# Patient Record
Sex: Female | Born: 1950 | Race: Black or African American | Hispanic: No | Marital: Married | Smoking: Never smoker
Health system: Southern US, Community
[De-identification: ages and names within clinical notes are randomized; demographics above are authoritative.]

## PROBLEM LIST (undated history)

## (undated) DIAGNOSIS — K5790 Diverticulosis of intestine, part unspecified, without perforation or abscess without bleeding: Secondary | ICD-10-CM

## (undated) DIAGNOSIS — K219 Gastro-esophageal reflux disease without esophagitis: Secondary | ICD-10-CM

## (undated) DIAGNOSIS — C801 Malignant (primary) neoplasm, unspecified: Secondary | ICD-10-CM

## (undated) DIAGNOSIS — K635 Polyp of colon: Secondary | ICD-10-CM

## (undated) DIAGNOSIS — M1990 Unspecified osteoarthritis, unspecified site: Secondary | ICD-10-CM

## (undated) DIAGNOSIS — H269 Unspecified cataract: Secondary | ICD-10-CM

## (undated) HISTORY — DX: Malignant (primary) neoplasm, unspecified: C80.1

## (undated) HISTORY — DX: Diverticulosis of intestine, part unspecified, without perforation or abscess without bleeding: K57.90

## (undated) HISTORY — PX: CHOLECYSTECTOMY: SHX55

## (undated) HISTORY — DX: Polyp of colon: K63.5

## (undated) HISTORY — DX: Unspecified osteoarthritis, unspecified site: M19.90

## (undated) HISTORY — DX: Gastro-esophageal reflux disease without esophagitis: K21.9

## (undated) HISTORY — DX: Unspecified cataract: H26.9

## (undated) HISTORY — PX: COLONOSCOPY: SHX174

## (undated) HISTORY — PX: ABDOMINAL HYSTERECTOMY: SHX81

## (undated) HISTORY — PX: TUBAL LIGATION: SHX77

## (undated) HISTORY — PX: POLYPECTOMY: SHX149

---

## 2000-08-27 ENCOUNTER — Encounter (INDEPENDENT_AMBULATORY_CARE_PROVIDER_SITE_OTHER): Payer: Self-pay | Admitting: Specialist

## 2000-08-27 ENCOUNTER — Other Ambulatory Visit: Admission: RE | Admit: 2000-08-27 | Discharge: 2000-08-27 | Payer: Self-pay | Admitting: *Deleted

## 2001-05-23 ENCOUNTER — Encounter: Payer: Self-pay | Admitting: General Surgery

## 2001-05-27 ENCOUNTER — Observation Stay (HOSPITAL_COMMUNITY): Admission: RE | Admit: 2001-05-27 | Discharge: 2001-05-28 | Payer: Self-pay | Admitting: General Surgery

## 2001-05-27 ENCOUNTER — Encounter (INDEPENDENT_AMBULATORY_CARE_PROVIDER_SITE_OTHER): Payer: Self-pay | Admitting: Specialist

## 2001-07-26 ENCOUNTER — Other Ambulatory Visit: Admission: RE | Admit: 2001-07-26 | Discharge: 2001-07-26 | Payer: Self-pay | Admitting: *Deleted

## 2002-07-27 ENCOUNTER — Other Ambulatory Visit: Admission: RE | Admit: 2002-07-27 | Discharge: 2002-07-27 | Payer: Self-pay | Admitting: *Deleted

## 2003-07-30 ENCOUNTER — Other Ambulatory Visit: Admission: RE | Admit: 2003-07-30 | Discharge: 2003-07-30 | Payer: Self-pay | Admitting: *Deleted

## 2004-08-04 ENCOUNTER — Other Ambulatory Visit: Admission: RE | Admit: 2004-08-04 | Discharge: 2004-08-04 | Payer: Self-pay | Admitting: *Deleted

## 2006-08-12 ENCOUNTER — Other Ambulatory Visit: Admission: RE | Admit: 2006-08-12 | Discharge: 2006-08-12 | Payer: Self-pay | Admitting: *Deleted

## 2006-08-23 ENCOUNTER — Encounter: Admission: RE | Admit: 2006-08-23 | Discharge: 2006-08-23 | Payer: Self-pay | Admitting: *Deleted

## 2006-08-23 IMAGING — CT CT ABDOMEN W/ CM
2 of 5 series · 17 of 46 positions shown, 19 images · IV contrast (READICAT/WATER & [ID] OMNI 300)
Comparison: none

CLINICAL DATA: Left lower abdominal pain

[Series 3: routine abdomen · axial · 0.79mm/px · z∈[-386,-10]mm · 14 of 85 slices shown, 16 images]
[im 5/85  soft-tissue]
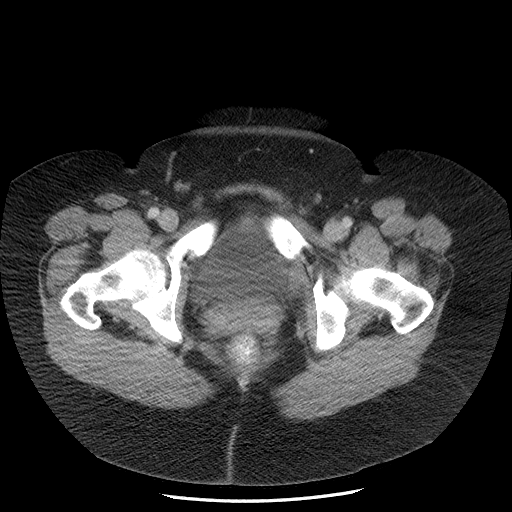
[im 5/85  bone]
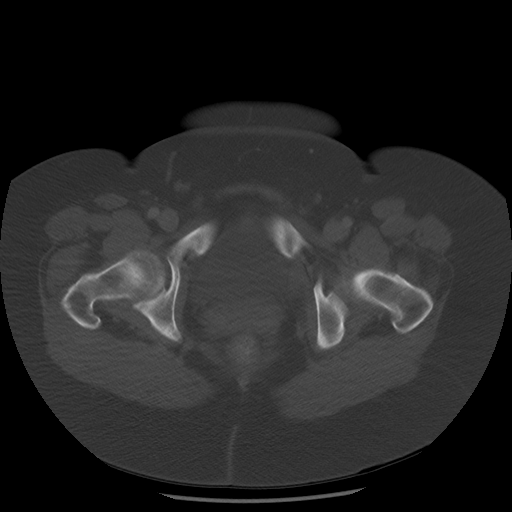
[im 10/85  soft-tissue]
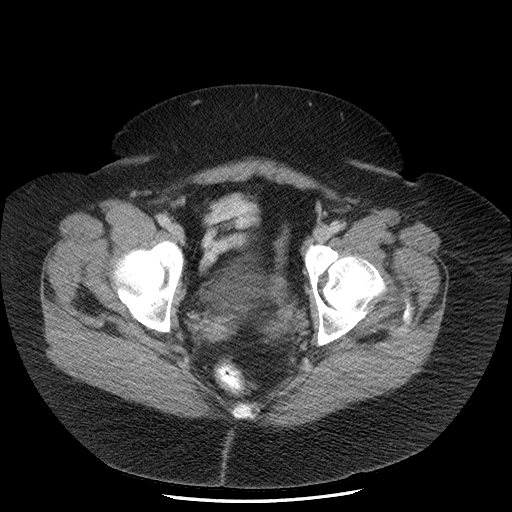
[im 19/85  soft-tissue]
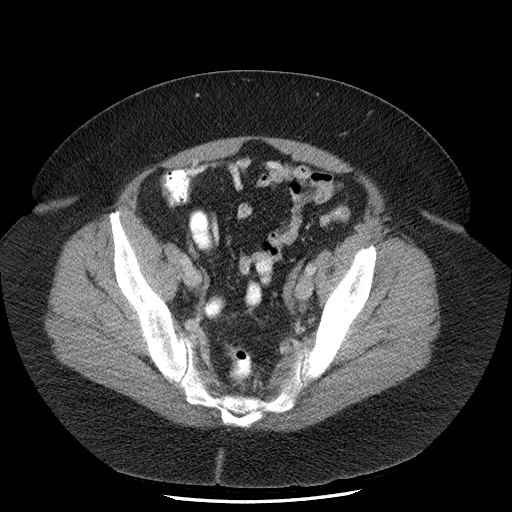
[im 24/85  soft-tissue]
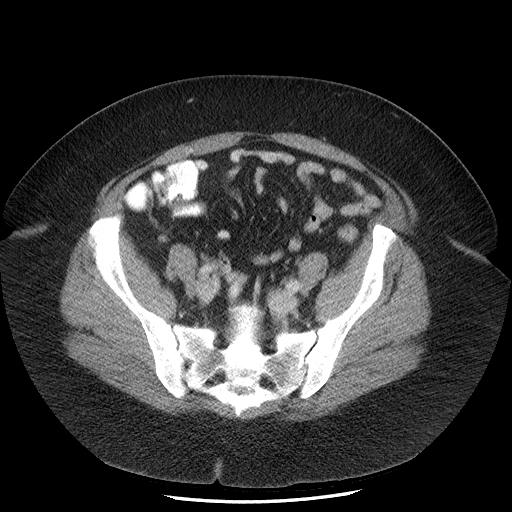
[im 29/85  soft-tissue]
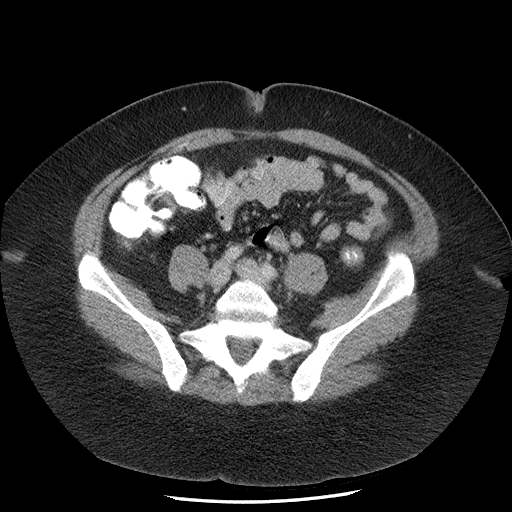
[im 33/85  soft-tissue]
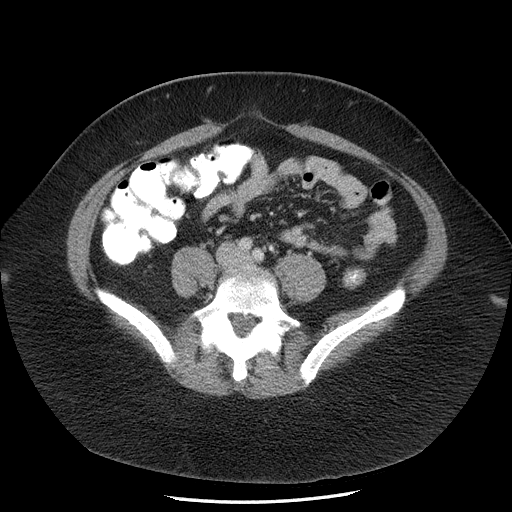
[im 38/85  soft-tissue]
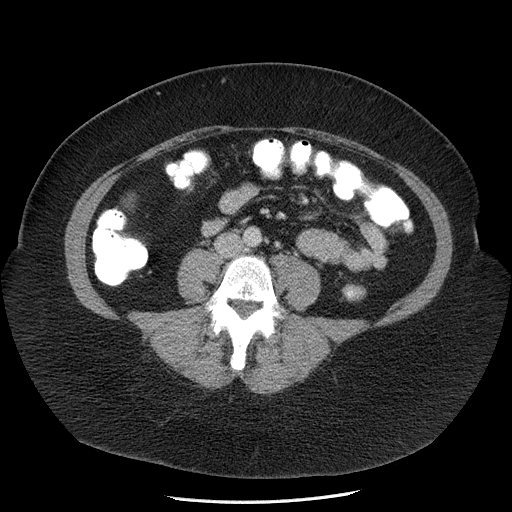
[im 47/85  soft-tissue]
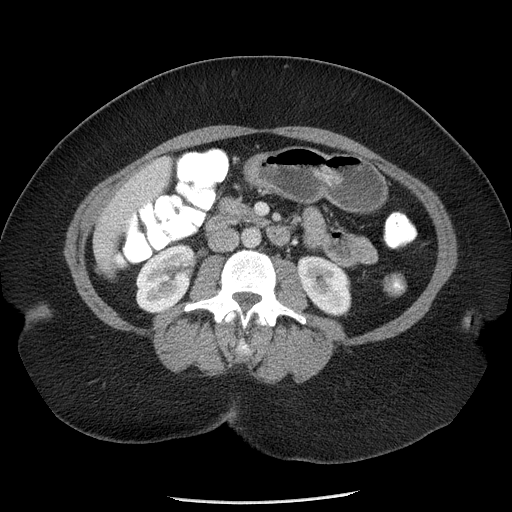
[im 52/85  soft-tissue]
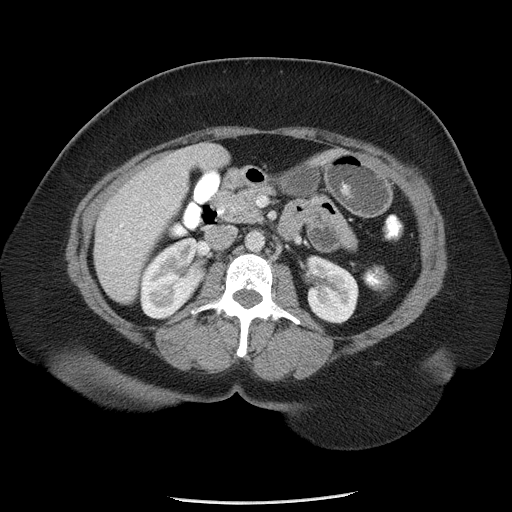
[im 52/85  bone]
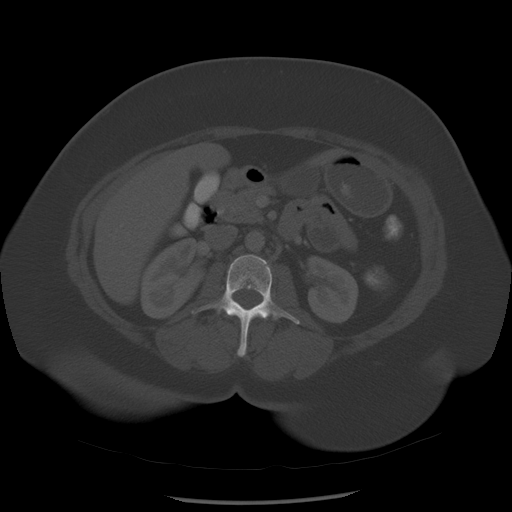
[im 57/85  soft-tissue]
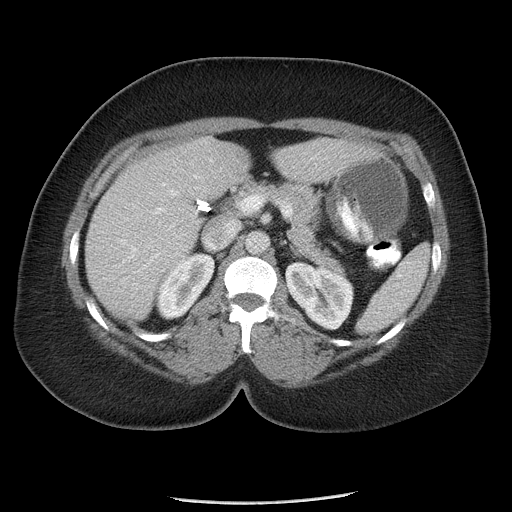
[im 61/85  soft-tissue]
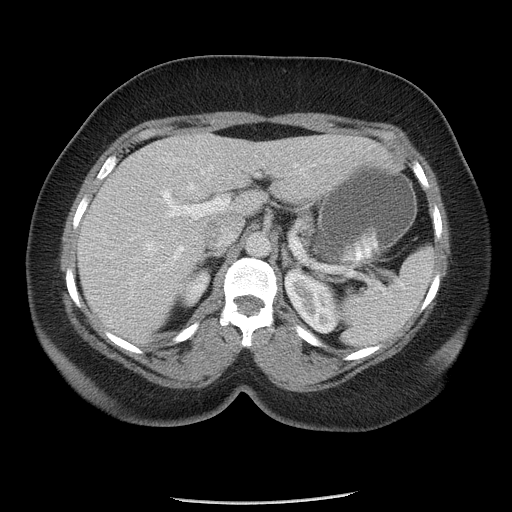
[im 66/85  soft-tissue]
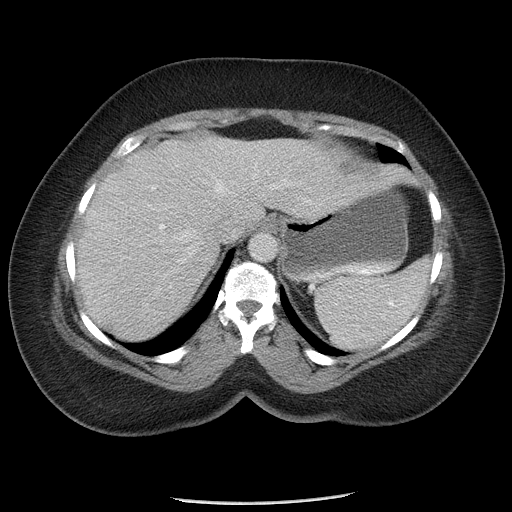
[im 75/85  soft-tissue]
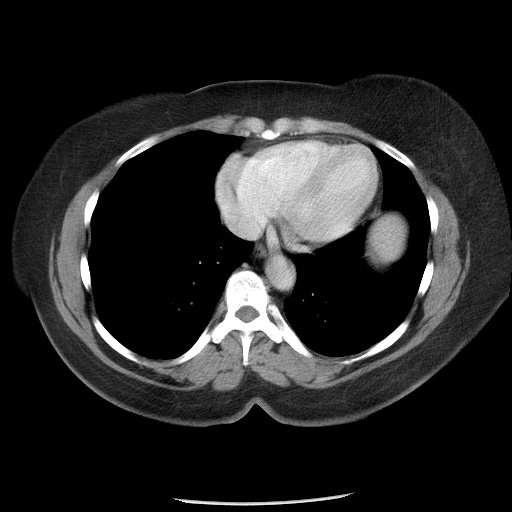
[im 80/85  soft-tissue]
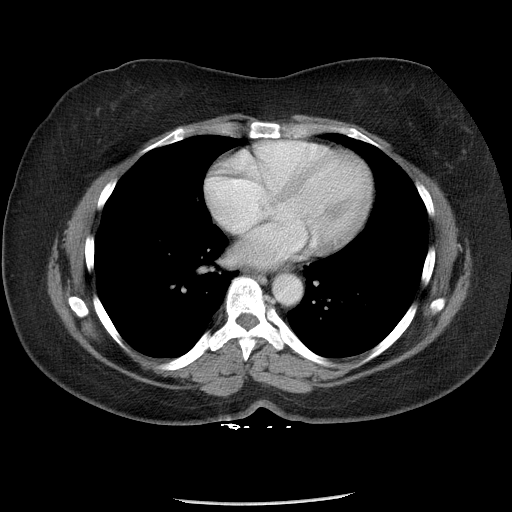

[Series 602: sagittal body · sagittal · 0.89mm/px · 3 of 163 slices shown]
[im 55/163  soft-tissue]
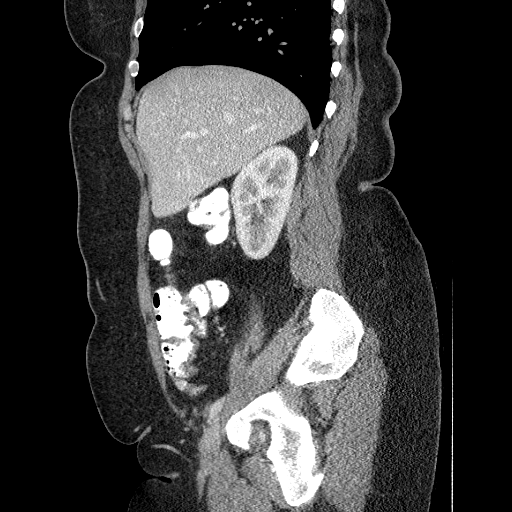
[im 73/163  soft-tissue]
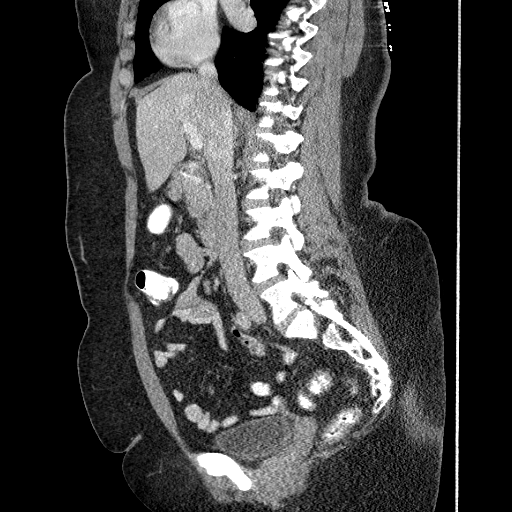
[im 91/163  soft-tissue]
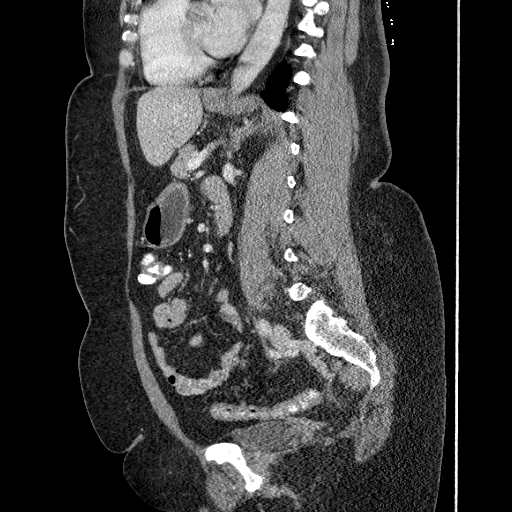

[17 of 46 positions shown; findings below may reference images not displayed]

CT abdomen with contrast:

Multidetector helical CT after 125  ml [LE] IV.
No previous for comparison. Visualized lung bases clear. Vascular clips in the
gallbladder fossa. Unremarkable liver, spleen, adrenal glands, kidneys,
pancreas, small bowel. No free air. No ascites. Portal vein patent. Delayed
scans show normal excretion by both kidneys.
IMPRESSION: 1. Unremarkable CT abdomen postcholecystectomy

CT pelvis with contrast:

Appendix not discretely identified. The colon is nondilated. Scattered
diverticula from descending and sigmoid portions of the colon with subtle
inflammatory/edematous changes around the distal descending colon. No
extraluminal gas or fluid. Urinary bladder incompletely distended. No free
fluid. Bilateral pelvic phleboliths. Hysterectomy. Osteitis pubis.
IMPRESSION: 1. Descending and sigmoid diverticulosis with question of mild distal descending
colon diverticulitis without abscess.

## 2006-08-25 ENCOUNTER — Ambulatory Visit: Payer: Self-pay | Admitting: Internal Medicine

## 2006-09-09 ENCOUNTER — Encounter (INDEPENDENT_AMBULATORY_CARE_PROVIDER_SITE_OTHER): Payer: Self-pay | Admitting: Specialist

## 2006-09-09 ENCOUNTER — Ambulatory Visit: Payer: Self-pay | Admitting: Internal Medicine

## 2006-09-09 DIAGNOSIS — K635 Polyp of colon: Secondary | ICD-10-CM

## 2006-09-09 HISTORY — DX: Polyp of colon: K63.5

## 2007-08-29 ENCOUNTER — Other Ambulatory Visit: Admission: RE | Admit: 2007-08-29 | Discharge: 2007-08-29 | Payer: Self-pay | Admitting: *Deleted

## 2010-11-21 NOTE — Op Note (Signed)
[facility]  Patient:    Tonya Bowers, Tonya Bowers Visit Number: [id number] MRN: [id number]          Service Type: SUR Location: [id number] Attending Physician:  Caleen Essex Proc. Date: 05/27/01 Admit Date:  05/27/2001 Discharge Date: 05/28/2001                             Operative Report  PREOPERATIVE DIAGNOSIS:  Cholelithiasis.  POSTOPERATIVE DIAGNOSIS:  Cholelithiasis.  PROCEDURE PERFORMED:  Laparoscopic cholecystectomy.  SURGEON:  Ollen Gross. Vernell Morgans, M.D.  ASSISTANT:  Zigmund Daniel, M.D.  ANESTHESIA:  General endotracheal.  DESCRIPTION OF PROCEDURE:  After informed consent was obtained, the patient was brought to the operating room and placed in the supine position on the operating room table.  After adequate induction of general anesthesia, the patients abdomen was prepped with Betadine, and draped in the usual sterile manner.  An area above the umbilicus was then infiltrated with 0.25% Marcaine and a small transverse incision was made using the 15 blade knife.  This incision was carried down through the subcutaneous tissue using blunt dissection with a Kelly clamp, and [employer] retractors until the linear alba was identified.  The linear alba was incised with the 15 blade knife and each side was grasped with Kocher clamps and elevated anteriorly.  The preperitoneal space was then probed bluntly with a hemostat until the peritoneum was opened and access was gained to the abdominal cavity.  A finger was inserted through this opening and there were no apparent adhesions to the abdominal wall.  A 0 Vicryl pursestring stitch was then placed in the fascia surrounding this opening.  A Hasson cannula was placed through this opening into the abdominal cavity and anchored in place with the previously placed Vicryl pursestring stitch.  The abdomen was then insufflated with carbon dioxide without difficulty and a laparoscope was placed through the  Hasson cannula.  The dome of the gallbladder and edge of the liver were readily identifiable.  A small transverse upper midline incision was then made after infiltrating this area with 0.25% Marcaine.  A 10 mm port was then placed bluntly through this incision into the abdominal cavity under direct vision. Places were then chosen on the right lateral abdomen for placement of 5 mm ports and each of these areas were infiltrated with 0.25% Marcaine.  Small stab incisions were made with the 15 blade knife and 5 mm ports were then placed bluntly through these incisions into the abdominal cavity under direct vision.  A blunt grasper was placed through the lateral most 5 mm port and used to grasp the dome of the gallbladder and elevate it anteriorly and superiorly.  Another blunt grasper was placed to the other 5 mm port and used to retract on the body and neck of the gallbladder.  A dissector was placed through the upper midline port and several adhesions to the body of the gallbladder were taken down both bluntly using the Bovie electrocautery.  Once this was complete, attention was then turned to the area of the gallbladder neck.  The peritoneal reflection of the gallbladder neck was opened with the dissector using the Bovie electrocautery and blunt dissection was then carried out in this area until the gallbladder neck/cystic duct junction was readily identified and dissected in a circumferential manner.  Three clips were then placed proximally and one distally on the cystic duct.  The duct was divided between  the two.  Posterior to this, the cystic artery was identified and again dissected in a circumferential manner.  Two clips were placed proximally and one distally in the artery and the artery was divided between the two. Next, a laparoscopic hook cautery was used to separate the gallbladder from the liver bed.  Prior to completely detaching the gallbladder from the liver bed, the liver  bed was inspected and several small bleeding points required coagulation with the Bovie electrocautery until the liver bed was hemostatic. Once this was accomplished, the gallbladder was then detached the rest of the way from the liver bed.  An endoscopic bag was placed to the upper midline port and the gallbladder was placed within the bag, the bag was sealed, and the string was cut.  The laparoscope was then removed to the upper midline port and a gallbladder grasper was placed through the Hasson cannula and used to grasp the bag, and the bag and gallbladder removed together through the supraumbilical port with the Hasson cannula.  The Hasson was then replaced and the abdomen was inspected.  The liver bed was found to be hemostatic.  The abdomen was then irrigated with copious amounts of saline until the effluent was clear.  The supraumbilical Hasson cannula was then removed and the fascia was closed with a previously placed Vicryl pursestring stitch.  The other ports were removed under direct vision and were found to be hemostatic.  The skin incisions were then closed with interrupted 4-0 Monocryl subcuticular stitches.  Benzoin and Steri-Strips were applied.  The patient tolerated the procedure well.  At the end of the case, all needle, sponge, and instrument counts were correct.  The patient was awakened and taken to the recovery room in stable condition. Attending Physician:  Caleen Essex DD:  05/30/01 TD:  05/31/01 Job: [id number]

## 2010-12-17 ENCOUNTER — Encounter: Payer: Self-pay | Admitting: Internal Medicine

## 2010-12-22 ENCOUNTER — Encounter: Payer: Self-pay | Admitting: Internal Medicine

## 2010-12-22 ENCOUNTER — Ambulatory Visit (INDEPENDENT_AMBULATORY_CARE_PROVIDER_SITE_OTHER): Payer: 59 | Admitting: Internal Medicine

## 2010-12-22 VITALS — BP 110/72 | HR 80 | Ht 65.5 in | Wt 247.0 lb

## 2010-12-22 DIAGNOSIS — R1032 Left lower quadrant pain: Secondary | ICD-10-CM

## 2010-12-22 DIAGNOSIS — K5732 Diverticulitis of large intestine without perforation or abscess without bleeding: Secondary | ICD-10-CM

## 2010-12-22 MED ORDER — METRONIDAZOLE 250 MG PO TABS: 250.0000 mg | ORAL_TABLET | Freq: Three times a day (TID) | ORAL | Status: AC

## 2010-12-22 MED ORDER — CIPROFLOXACIN HCL 250 MG PO TABS: 250.0000 mg | ORAL_TABLET | Freq: Two times a day (BID) | ORAL | Status: AC

## 2010-12-22 NOTE — Patient Instructions (Addendum)
We have sent Cipro 250 mg to your pharmacy. Take 1 tablet by mouth twice daily x 10 days We have sent Flagyl to your pharmacy. Take 1 tablet by mouth three times daily x 10 days. Please see Dr Juanda Chance again in 2 months. CC: Dr Chevis Pretty

## 2010-12-22 NOTE — Progress Notes (Signed)
Tonya Bowers [birth year] MRN [id number]     History of Present Illness:  This is a 60 year old African American female with left lower quadrant abdominal pain of 2 months duration. She has seen Dr.Mezer, thinking it was her ovary. A pelvic ultrasound showed thickening of the left colon. She has a history of diverticulitis documented on a CT scan of the abdomen from February 2008. The scan showed inflammatory changes of the distal descending colon consistent with diverticulitis. A subsequent colonoscopy in March 2008 showed a hyperplastic polyp and diverticulosis. She has not had any flareups until 2 months ago. She denies fever, diarrhea or rectal bleeding. The pain has gradually improved and has no longer occurred every day. She denies being constipated. There is no family history of colon cancer. She had a laparoscopic cholecystectomy in 2002.   Past Medical History  Diagnosis Date  . Diverticulosis   . Hyperplastic colon polyp 09/09/06   Past Surgical History  Procedure Date  . Abdominal hysterectomy   . Cholecystectomy     reports that she has never smoked. She has never used smokeless tobacco. She reports that she does not drink alcohol or use illicit drugs. family history is negative for Colon cancer. Allergies  Allergen Reactions  . Penicillins         Review of Systems:Denies dysphagia odynophagia shortness or breath or chest pain negative for rectal bleeding  The remainder of the 10  point ROS is negative except as outlined in H&P   Physical Exam: General appearance  Well developed,in no distress. Eyes- non icteric. HEENT nontraumatic, normocephalic. Mouth no lesions, tongue papillated, no cheilosis. Neck supple without adenopathy, thyroid not enlarged, no carotid bruits, no JVD. Lungs Clear to auscultation bilaterally. Cor normal S1 normal S2, regular rhythm , no murmur,  quiet precordium. Abdomen Soft obese abdomen normoactive bowel sounds. Mild tenderness on deep  pressure in the left lower and left middle quadrants. No rebound, no mass. Liver edge at costal margin. Rectal:No stool on digital exam. Perianal area normal,mucous is Hemoccult negative. Extremities no pedal edema. Skin no lesions. Neurological alert and oriented x 3. Psychological normal mood and affect.  Assessment and Plan:  Problem #1: Patient has left lower quadrant abdominal pain with suggestion of a thickened left colon on pelvic ultrasound. She has a history of diverticulitis in 2008. I believe this is a recurrent episode. Her symptoms have actually improved.We will  treat her medically  with Cipro and Flagyl for 10 days and if the symptoms continue, we will proceed with colonoscopy. She will stay on a low-residue diet and bland foods. She will call us with an update in 3 weeks.   12/22/2010 [name]

## 2011-02-09 ENCOUNTER — Encounter: Payer: Self-pay | Admitting: Internal Medicine

## 2011-02-09 ENCOUNTER — Ambulatory Visit (INDEPENDENT_AMBULATORY_CARE_PROVIDER_SITE_OTHER): Payer: 59 | Admitting: Internal Medicine

## 2011-02-09 VITALS — BP 138/72 | HR 90 | Ht 65.0 in | Wt 247.0 lb

## 2011-02-09 DIAGNOSIS — K5732 Diverticulitis of large intestine without perforation or abscess without bleeding: Secondary | ICD-10-CM

## 2011-02-09 DIAGNOSIS — R1012 Left upper quadrant pain: Secondary | ICD-10-CM

## 2011-02-09 NOTE — Patient Instructions (Signed)
Dr Chevis Pretty

## 2011-02-09 NOTE — Progress Notes (Signed)
Tonya Bowers May 15, 1951 MRN [id number]        History of Present Illness:  This is a 60 year old white female with the left upper quadrant abdominal pain and history of diverticulitis. The pain has improved but is intermittently present usually after meals. She had diverticulitis documented on CT scan of the abdomen as inflammatory changes in the distal descending colon in  2008. Colonoscopy in March 2008 showed hyperplastic polyp and diverticulosis. She has a history of prior cholecystectomy and hysterectomy. She took Pepcid  in the past before her gallbladder surgery but has not taken any recently. She has occasional heartburn.   Past Medical History  Diagnosis Date  . Diverticulosis   . Hyperplastic colon polyp 09/09/06   Past Surgical History  Procedure Date  . Abdominal hysterectomy   . Cholecystectomy     reports that she has never smoked. She has never used smokeless tobacco. She reports that she does not drink alcohol or use illicit drugs. family history is negative for Colon cancer. Allergies  Allergen Reactions  . Penicillins         Review of Systems: Denies dysphagia or diet dysphagia or shortness of breath or chest pain  The remainder of the 10  point ROS is negative except as outlined in H&P   Physical Exam: General appearance  Well developed in no distress Eyes- non icteric HEENT nontraumatic, normocephalic Mouth no lesions, tongue papillated, no cheilosis Neck supple without adenopathy, thyroid not enlarged, no carotid bruits, no JVD Lungs Clear to auscultation bilaterally Cor normal S1 normal S2, regular rhythm , no murmur,  quiet precordium Abdomen minimal tenderness along the ulcer margin. No tenderness of fullness in left lower quadrant Rectal: Not done Extremities no pedal edema Skin no lesions Neurological alert and oriented x3 Psychological normal mood and  affect  Assessment and Plan:   Left costal margin discomfort most likely related to  gastric irritation rather than to diverticulitis. I have given her samples of Prilosec 20 mg a day to use for 14 days. She may purchase it  over-the-counter . If the symptoms continue consider upper endoscopy  Hyperplastic polyps of the colon. Recall colonoscopy in March 2015  History of diverticulitis in 2008. Currently asymptomatic. Abnormal pelvic ultrasound raising a question of abnormality of the left colon. Her exam is unremarkable today. Consider CT scan of the abdomen if symptoms reappear   02/09/2011 [name]

## 2011-08-07 ENCOUNTER — Encounter: Payer: Self-pay | Admitting: Internal Medicine

## 2011-08-07 ENCOUNTER — Ambulatory Visit (INDEPENDENT_AMBULATORY_CARE_PROVIDER_SITE_OTHER): Payer: 59 | Admitting: Internal Medicine

## 2011-08-07 VITALS — BP 140/82 | HR 72 | Temp 98.1°F | Ht 65.5 in | Wt 250.0 lb

## 2011-08-07 DIAGNOSIS — IMO0001 Reserved for inherently not codable concepts without codable children: Secondary | ICD-10-CM

## 2011-08-07 DIAGNOSIS — Z0000 Encounter for general adult medical examination without abnormal findings: Secondary | ICD-10-CM

## 2011-08-07 NOTE — Progress Notes (Signed)
  Subjective:    Patient ID: Tonya Bowers, female    DOB: 29-Aug-1950, 61 y.o.   MRN: [id number]  HPI  -year-old patient who is in today to this with our practice. She is followed annually by gynecology. She is postmenopausal and takes hormone replacement therapy. She enjoys excellent health she takes omeprazole for occasional gastroesophageal reflux disease. She has a history colonic polyps and is due for followup colonoscopy in 2015. For the past several days she has had some mild left knee discomfort but in general does quite well Past surgical history is pertinent for a cholecystectomy in 2002 she had a tonsillectomy in 1963 partial hysterectomy in 1997. This was performed due to have fibroids and endometriosis. Social history married 3 children one grandson lifelong nonsmoker Works as a Science writer Family history details of her father's health unknown Mother age 16 history of hypertension 3 brothers one status post renal transplant One sister positive for diabetes obesity and hypertension    Review of Systems  Constitutional: Negative for fever, appetite change, fatigue and unexpected weight change.  HENT: Negative for hearing loss, ear pain, nosebleeds, congestion, sore throat, mouth sores, trouble swallowing, neck stiffness, dental problem, voice change, sinus pressure and tinnitus.   Eyes: Negative for photophobia, pain, redness and visual disturbance.  Respiratory: Negative for cough, chest tightness and shortness of breath.   Cardiovascular: Negative for chest pain, palpitations and leg swelling.  Gastrointestinal: Negative for nausea, vomiting, abdominal pain, diarrhea, constipation, blood in stool, abdominal distention and rectal pain.  Genitourinary: Negative for dysuria, urgency, frequency, hematuria, flank pain, vaginal bleeding, vaginal discharge, difficulty urinating, genital sores, vaginal pain, menstrual problem and pelvic pain.  Musculoskeletal: Negative for back pain and  arthralgias (mild left knee pain).  Skin: Negative for rash.  Neurological: Negative for dizziness, syncope, speech difficulty, weakness, light-headedness, numbness and headaches.  Hematological: Negative for adenopathy. Does not bruise/bleed easily.  Psychiatric/Behavioral: Negative for suicidal ideas, behavioral problems, self-injury, dysphoric mood and agitation. The patient is not nervous/anxious.        Objective:   Physical Exam  Constitutional: She is oriented to person, place, and time. She appears well-developed and well-nourished.       Overweight. Blood pressure 140/80  HENT:  Head: Normocephalic and atraumatic.  Right Ear: External ear normal.  Left Ear: External ear normal.  Mouth/Throat: Oropharynx is clear and moist.  Eyes: Conjunctivae and EOM are normal.  Neck: Normal range of motion. Neck supple. No JVD present. No thyromegaly present.  Cardiovascular: Normal rate, regular rhythm, normal heart sounds and intact distal pulses.   No murmur heard. Pulmonary/Chest: Effort normal and breath sounds normal. She has no wheezes. She has no rales.  Abdominal: Soft. Bowel sounds are normal. She exhibits no distension and no mass. There is no tenderness. There is no rebound and no guarding.  Musculoskeletal: Normal range of motion. She exhibits no edema and no tenderness.  Neurological: She is alert and oriented to person, place, and time. She has normal reflexes. No cranial nerve deficit. She exhibits normal muscle tone. Coordination normal.  Skin: Skin is warm and dry. No rash noted.  Psychiatric: She has a normal mood and affect. Her behavior is normal.          Assessment & Plan:   Exogenous obesity Preventive health exam Left knee pain-  trial rest and anti-inflammatory medication Postmenopausal syndrome History of colonic polyps History of vitamin D deficiency Nongastrointestinal soft reflux disease

## 2011-08-07 NOTE — Patient Instructions (Addendum)
It is important that you exercise regularly, at least 20 minutes 3 to 4 times per week.  If you develop chest pain or shortness of breath seek  medical attention.  You need to lose weight.  Consider a lower calorie diet and regular exercise.  Limit your sodium (Salt) intake  Take a calcium supplement, plus [phone] units of vitamin D   Take Aleve 200 mg twice daily for pain or swelling  Return in one year for follow-up

## 2011-09-08 ENCOUNTER — Encounter: Payer: Self-pay | Admitting: Internal Medicine

## 2011-09-08 ENCOUNTER — Ambulatory Visit (INDEPENDENT_AMBULATORY_CARE_PROVIDER_SITE_OTHER): Payer: 59 | Admitting: Internal Medicine

## 2011-09-08 VITALS — BP 134/78 | HR 68 | Ht 65.0 in | Wt 247.0 lb

## 2011-09-08 DIAGNOSIS — R1012 Left upper quadrant pain: Secondary | ICD-10-CM

## 2011-09-08 NOTE — Progress Notes (Signed)
Tonya Bowers June 28, 1951 MRN [id number]    History of Present Illness:  This is a 61 year old African American female with chronic left upper quadrant abdominal pain previously evaluated in August 2012. She has history of diverticulitis in 2008 when a CT scan of the abdomen showed inflammation in the left colon. Her last colonoscopy in March 2008 showed diverticulosis as well as a hyperplastic polyp. The pain is constant, sometimes worse postprandially. She has occasional diarrhea but most of the time is constipated. She has indigestion. There is no weight loss, fever or nausea. She has a a history of a cholecystectomy in 2002 and total abdominal hysterectomy in 1997. She has taken Aleve for knee pain.   Past Medical History  Diagnosis Date  . Diverticulosis   . Hyperplastic colon polyp 09/09/06  . GERD (gastroesophageal reflux disease)   . Abdominal pain    Past Surgical History  Procedure Date  . Abdominal hysterectomy   . Cholecystectomy     reports that she has never smoked. She has never used smokeless tobacco. She reports that she does not drink alcohol or use illicit drugs. family history includes Hypertension in her brother, father, and mother and Kidney disease in her brother.  There is no history of Colon cancer. Allergies  Allergen Reactions  . Penicillins         Review of Systems: Denies dysphagia, odynophagia, chest pain or shortness of breath  The remainder of the 10 point ROS is negative except as outlined in H&P   Physical Exam: General appearance  Well developed, in no distress. Mildly overweight Eyes- non icteric. HEENT nontraumatic, normocephalic. Mouth no lesions, tongue papillated, no cheilosis. Neck supple without adenopathy, thyroid not enlarged, no carotid bruits, no JVD. Lungs Clear to auscultation bilaterally. Cor normal S1, normal S2, regular rhythm, no murmur,  quiet precordium. Abdomen: Obese soft with minimal tenderness along the left costal  margin. No fullness or mass. No CVA tenderness. Right upper quadrant unremarkable. No hernia. Rectal: Not done Extremities no pedal edema. Skin no lesions. Neurological alert and oriented x 3. Psychological normal mood and affect.  Assessment and Plan:  Problem #1 Chronic left upper quadrant abdominal pain likely related either to her colon or to her Stomach; she has known moderate diverticulosis but today's exam does not suggest diverticulitis. She may have irritable bowel syndrome, splenic flexure syndrome or possibly gastritis. We will pursue a CT scan of the abdomen with attention to the left upper quadrant as well as an upper endoscopy. I have given her samples of Prilosec 20 mg to take daily for 8 days.   09/08/2011 [name]

## 2011-09-08 NOTE — Patient Instructions (Signed)
You have been scheduled for an endoscopy with propofol. Please follow written instructions given to you at your visit today.  You have been scheduled for a CT scan of the abdomen and pelvis at [facility] ([address]---this is in the same building as Architectural technologist).   You are scheduled on 09/10/11 at 10:30 am. You should arrive 15 minutes prior to your appointment time for registration. Please follow the written instructions below on the day of your exam:  WARNING: IF YOU ARE ALLERGIC TO IODINE/X-RAY DYE, PLEASE NOTIFY RADIOLOGY IMMEDIATELY AT [phone]! YOU WILL BE GIVEN A 13 HOUR PREMEDICATION PREP.  1) Do not eat or drink anything after 6:30 am (4 hours prior to your test) 2) You have been given 1 bottle of oral contrast to drink. The solution may taste better if refrigerated, but do NOT add ice or any other liquid to this solution. Shake well before drinking.    Drink 1 bottle of contrast @ 9:30 am (1 hour prior to your exam)  You may take any medications as prescribed with a small amount of water except for the following: Metformin, Glucophage, Glucovance, Avandamet, Riomet, Fortamet, Actoplus Met, Janumet, Glumetza or Metaglip. The above medications must be held the day of the exam AND 48 hours after the exam.  The purpose of you drinking the oral contrast is to aid in the visualization of your intestinal tract. The contrast solution may cause some diarrhea. Before your exam is started, you will be given a small amount of fluid to drink. Depending on your individual set of symptoms, you may also receive an intravenous injection of x-ray contrast/dye. Plan on being at [facility] for 30 minutes or long, depending on the type of exam you are having performed.  If you have any questions regarding your exam or if you need to reschedule, you may call the CT department at [phone] between the hours of 8:00 am and 5:00 pm,  Monday-Friday.  ________________________________________________________________________ CC: Dr Sanjuana Mae

## 2011-09-10 ENCOUNTER — Ambulatory Visit (INDEPENDENT_AMBULATORY_CARE_PROVIDER_SITE_OTHER)
Admission: RE | Admit: 2011-09-10 | Discharge: 2011-09-10 | Disposition: A | Discharge status: Home / Self Care | Payer: 59 | Source: Ambulatory Visit | Attending: Internal Medicine | Admitting: Internal Medicine

## 2011-09-10 DIAGNOSIS — R1012 Left upper quadrant pain: Secondary | ICD-10-CM

## 2011-09-10 IMAGING — CT CT ABDOMEN W/ CM
2 of 5 series · 17 of 46 positions shown, 19 images · IV contrast (Omnipaque 300)
Comparison: [DATE]

CLINICAL DATA: Left upper quadrant pain; history of diverticulitis

CT ABDOMEN WITH CONTRAST
TECHNIQUE: Multidetector CT imaging of the abdomen was performed
using the standard protocol following bolus administration of
intravenous contrast.
Contrast: 100mL OMNIPAQUE IOHEXOL 300 MG/ML IJ SOLN

[Series 2: abd/ pel 5mm · axial · 0.81mm/px · z∈[-308,-68]mm · 14 of 56 slices shown, 16 images]
[im 4/56  soft-tissue]
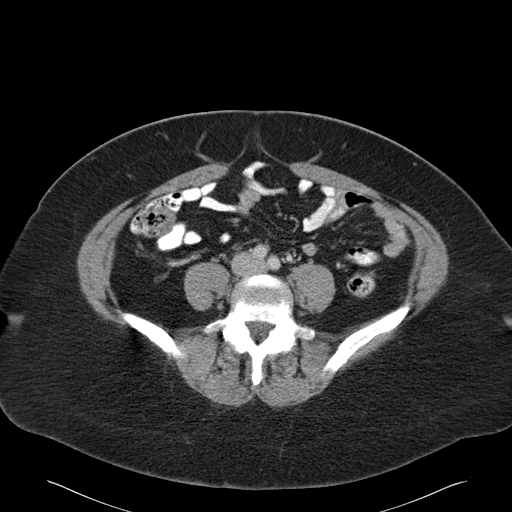
[im 4/56  bone]
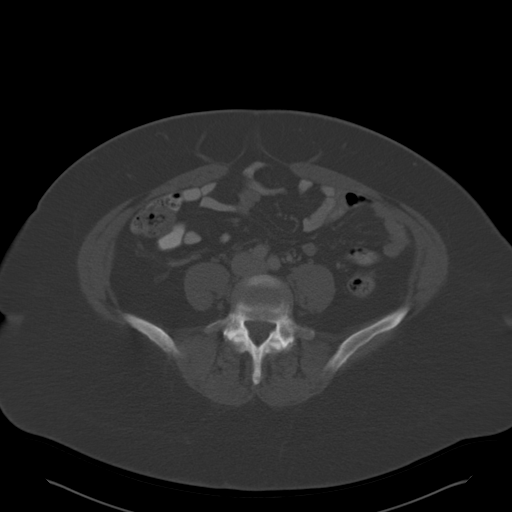
[im 7/56  soft-tissue]
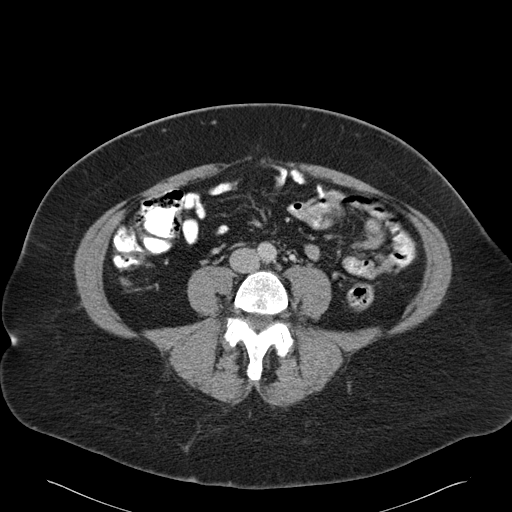
[im 11/56  soft-tissue]
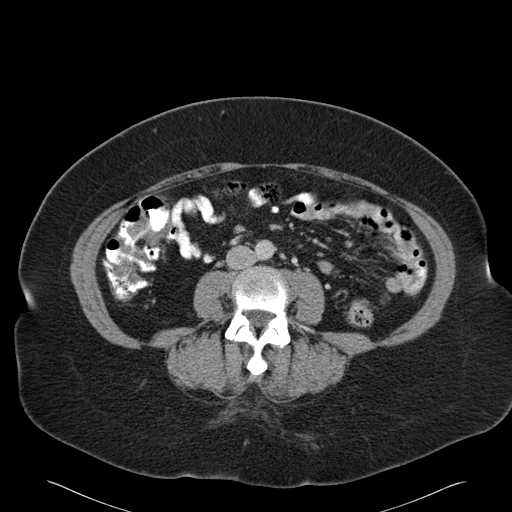
[im 14/56  soft-tissue]
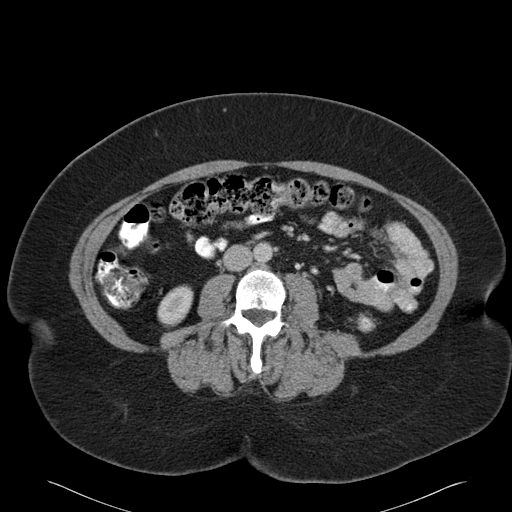
[im 18/56  soft-tissue]
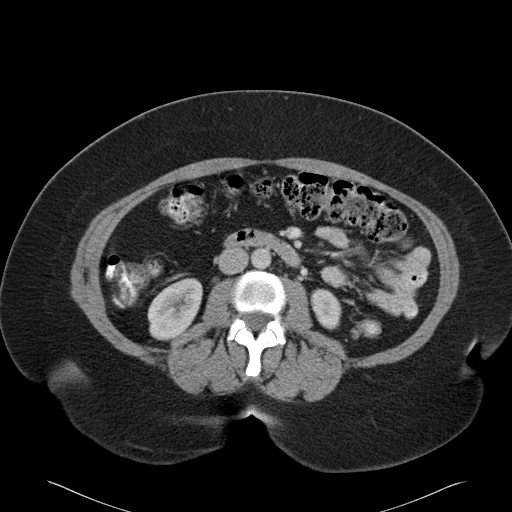
[im 21/56  soft-tissue]
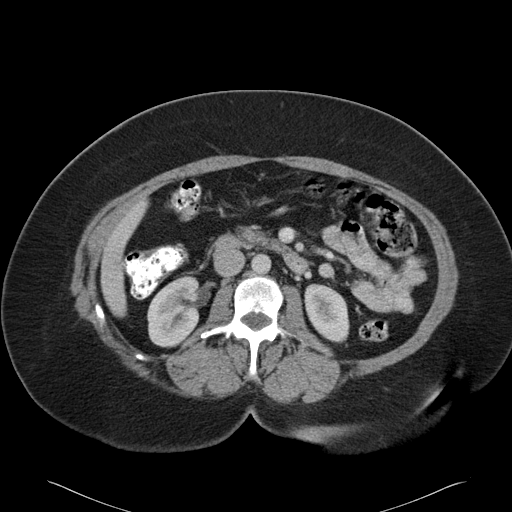
[im 25/56  soft-tissue]
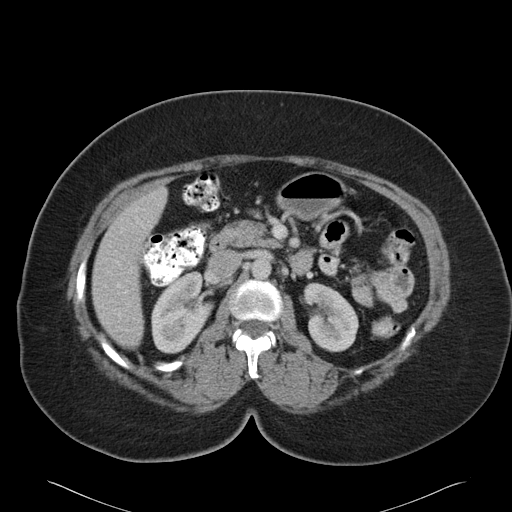
[im 31/56  soft-tissue]
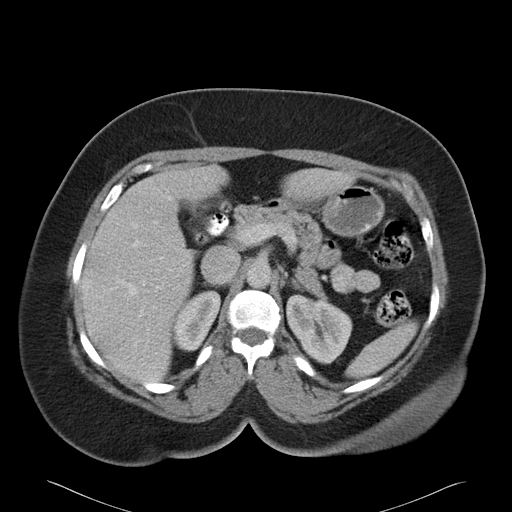
[im 35/56  soft-tissue]
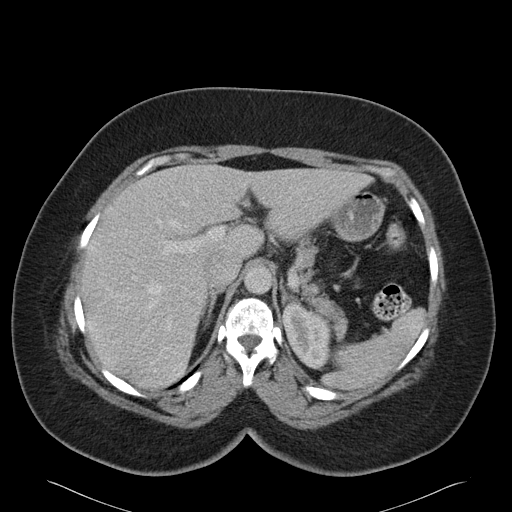
[im 35/56  bone]
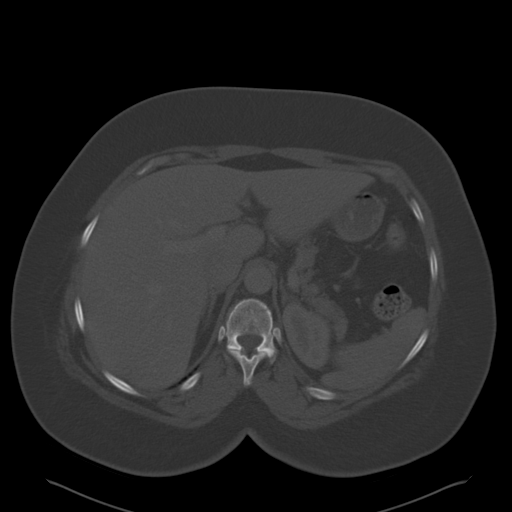
[im 38/56  soft-tissue]
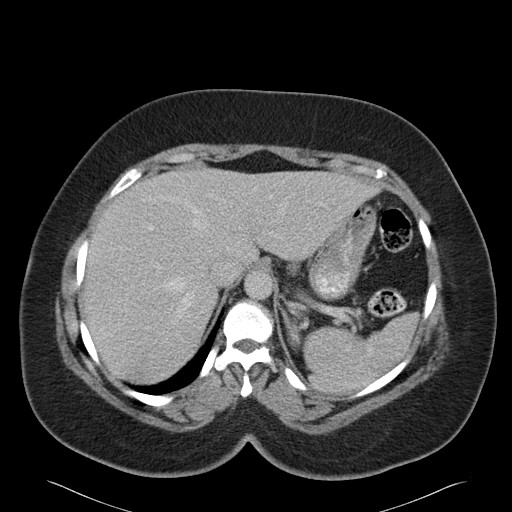
[im 42/56  soft-tissue]
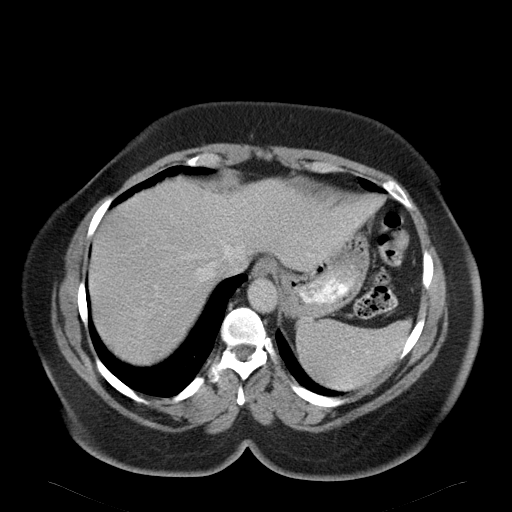
[im 45/56  soft-tissue]
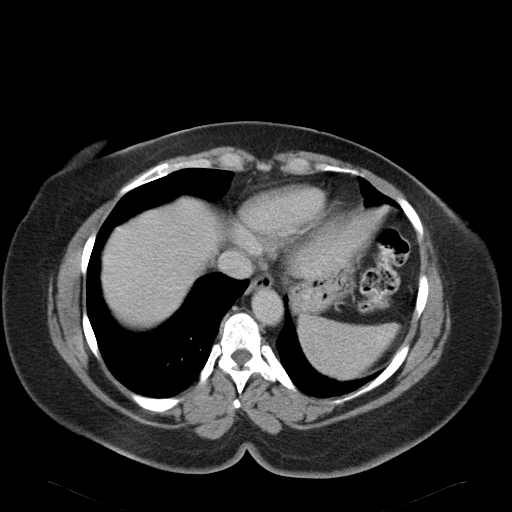
[im 49/56  soft-tissue]
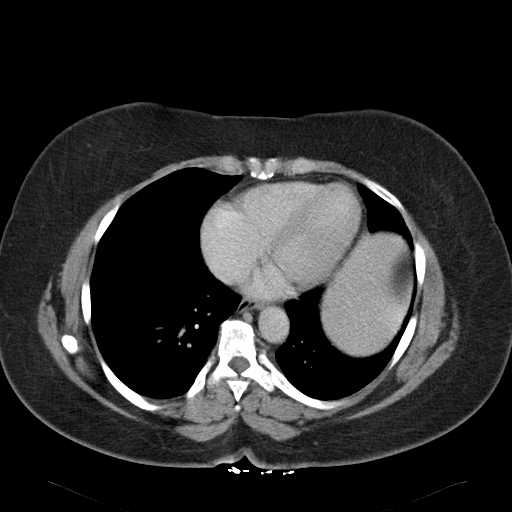
[im 52/56  soft-tissue]
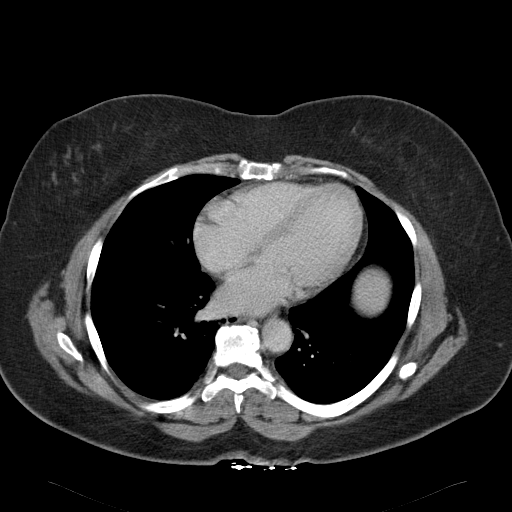

[Series 602: cor · coronal · 0.76mm/px · 3 of 108 slices shown]
[im 36/108  soft-tissue]
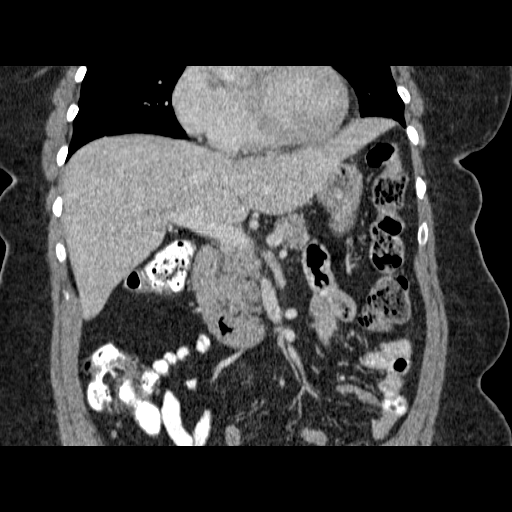
[im 48/108  soft-tissue]
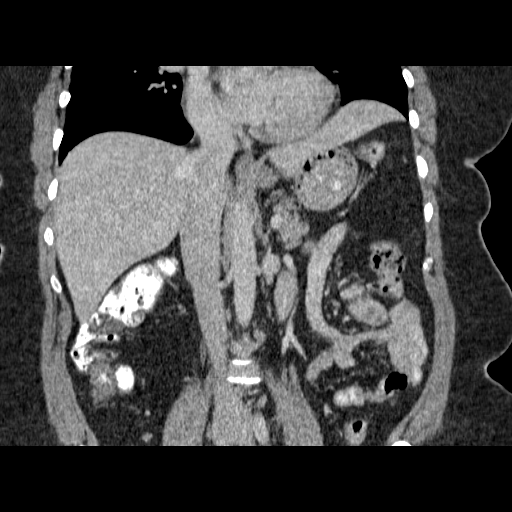
[im 60/108  soft-tissue]
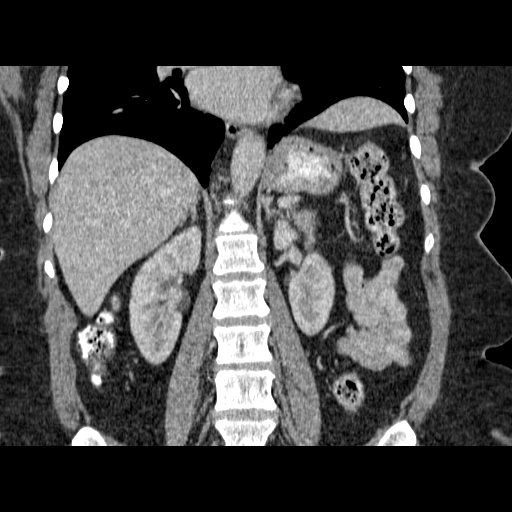

[17 of 46 positions shown; findings below may reference images not displayed]

FINDINGS: A 3 mm subpleural posterior right lower lobe nodule is
stable.  Surgical clips are present in the gallbladder fossa.  The
liver, spleen, pancreas, adrenal glands, kidneys have a normal
appearance.  Several descending colonic diverticula are identified
but there is no radiographic evidence of diverticulitis involving
the visualized colon.  The visualized small bowel loops are
unremarkable.  The appendix is not included on this study.  There
is no free fluid, adenopathy, pneumoperitoneum within the abdomen.
Facet degenerative changes are noted within the lumbar spine.
IMPRESSION: Mild distal descending colonic diverticulosis without radiographic
evidence of diverticulitis.

## 2011-09-10 MED ORDER — IOHEXOL 300 MG/ML  SOLN
100.0000 mL | Freq: Once | INTRAMUSCULAR | Status: AC | PRN
  Administered 2011-09-10: 100 mL via INTRAVENOUS

## 2011-09-23 ENCOUNTER — Encounter: Payer: Self-pay | Admitting: Internal Medicine

## 2011-09-23 ENCOUNTER — Ambulatory Visit (AMBULATORY_SURGERY_CENTER): Payer: 59 | Admitting: Internal Medicine

## 2011-09-23 VITALS — BP 148/97 | HR 73 | Temp 97.5°F | Resp 19 | Ht 65.0 in | Wt 247.0 lb

## 2011-09-23 DIAGNOSIS — K2970 Gastritis, unspecified, without bleeding: Secondary | ICD-10-CM

## 2011-09-23 DIAGNOSIS — K2990 Gastroduodenitis, unspecified, without bleeding: Secondary | ICD-10-CM

## 2011-09-23 DIAGNOSIS — R1012 Left upper quadrant pain: Secondary | ICD-10-CM

## 2011-09-23 DIAGNOSIS — K2900 Acute gastritis without bleeding: Secondary | ICD-10-CM

## 2011-09-23 MED ORDER — HYOSCYAMINE SULFATE 0.125 MG SL SUBL: 0.1250 mg | SUBLINGUAL_TABLET | SUBLINGUAL | Status: AC | PRN

## 2011-09-23 MED ORDER — SODIUM CHLORIDE 0.9 % IV SOLN: 500.0000 mL | INTRAVENOUS | Status: DC

## 2011-09-23 NOTE — Patient Instructions (Signed)
Per Dr. Juanda Chance rx for Levsin for abdominal pain.  Written rx given to the pt's care partner.  Resume your prior medications today.  Call if any questions or concerns.   YOU HAD AN ENDOSCOPIC PROCEDURE TODAY AT THE [facility]: Refer to the procedure report that was given to you for any specific questions about what was found during the examination.  If the procedure report does not answer your questions, please call your gastroenterologist to clarify.  If you requested that your care partner not be given the details of your procedure findings, then the procedure report has been included in a sealed envelope for you to review at your convenience later.  YOU SHOULD EXPECT: Some feelings of bloating in the abdomen. Passage of more gas than usual.  Walking can help get rid of the air that was put into your GI tract during the procedure and reduce the bloating. If you had a lower endoscopy (such as a colonoscopy or flexible sigmoidoscopy) you may notice spotting of blood in your stool or on the toilet paper. If you underwent a bowel prep for your procedure, then you may not have a normal bowel movement for a few days.  DIET: Your first meal following the procedure should be a light meal and then it is ok to progress to your normal diet.  A half-sandwich or bowl of soup is an example of a good first meal.  Heavy or fried foods are harder to digest and may make you feel nauseous or bloated.  Likewise meals heavy in dairy and vegetables can cause extra gas to form and this can also increase the bloating.  Drink plenty of fluids but you should avoid alcoholic beverages for 24 hours.  ACTIVITY: Your care partner should take you home directly after the procedure.  You should plan to take it easy, moving slowly for the rest of the day.  You can resume normal activity the day after the procedure however you should NOT DRIVE or use heavy machinery for 24 hours (because of the sedation medicines used during  the test).    SYMPTOMS TO REPORT IMMEDIATELY: A gastroenterologist can be reached at any hour.  During normal business hours, 8:30 AM to 5:00 PM Monday through Friday, call [phone].  After hours and on weekends, please call the GI answering service at [phone] who will take a message and have the physician on call contact you.     Following upper endoscopy (EGD)  Vomiting of blood or coffee ground material  New chest pain or pain under the shoulder blades  Painful or persistently difficult swallowing  New shortness of breath  Fever of 100F or higher  Black, tarry-looking stools  FOLLOW UP: If any biopsies were taken you will be contacted by phone or by letter within the next 1-3 weeks.  Call your gastroenterologist if you have not heard about the biopsies in 3 weeks.  Our staff will call the home number listed on your records the next business day following your procedure to check on you and address any questions or concerns that you may have at that time regarding the information given to you following your procedure. This is a courtesy call and so if there is no answer at the home number and we have not heard from you through the emergency physician on call, we will assume that you have returned to your regular daily activities without incident.  SIGNATURES/CONFIDENTIALITY: You and/or your care partner have signed paperwork  which will be entered into your electronic medical record.  These signatures attest to the fact that that the information above on your After Visit Summary has been reviewed and is understood.  Full responsibility of the confidentiality of this discharge information lies with you and/or your care-partner.

## 2011-09-23 NOTE — Progress Notes (Signed)
No complaints noted in the recovery room. Maw  Patient did not experience any of the following events: a burn prior to discharge; a fall within the facility; wrong site/side/patient/procedure/implant event; or a hospital transfer or hospital admission upon discharge from the facility. (G8907) Patient did not have preoperative order for IV antibiotic SSI prophylaxis. (G8918)  

## 2011-09-23 NOTE — Op Note (Signed)
[facility] [address] [place], [place]  [address]  ENDOSCOPY PROCEDURE REPORT  PATIENT:  Tonya Bowers, Tonya Bowers  MR#:  [id number] BIRTHDATE:  08-26-1950, 60 yrs. old  GENDER:  female  ENDOSCOPIST:  Hedwig Morton. Juanda Chance, MD Referred by:  Eleonore Chiquito, M.D.  PROCEDURE DATE:  09/23/2011 PROCEDURE:  EGD with biopsy, 43239 ASA CLASS:  Class II INDICATIONS:  abdominal pain, left upper quadr normal CT scan s/p remote chole  MEDICATIONS:   MAC sedation, administered by CRNA 200 mg IV TOPICAL ANESTHETIC:  DESCRIPTION OF PROCEDURE:   After the risks benefits and alternatives of the procedure were thoroughly explained, informed consent was obtained.  The LB GIF-H180 [id number] endoscope was introduced through the mouth and advanced to the second portion of the duodenum, without limitations.  The instrument was slowly withdrawn as the mucosa was fully examined. <<PROCEDUREIMAGES>>  Mild gastritis was found.  Erythema was found antrum Biopsies of the antrum and body of the stomach were obtained and sent to pathology (see image3). send for H-pylori  Otherwise the examination was normal (see image1, image4, and image6). Retroflexed views revealed no abnormalities.    The scope was then withdrawn from the patient and the procedure completed.  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 Juanda Chance, MD  CC:  n. eSIGNED:   Hedwig Morton. Keauna Brasel at 09/23/2011 10:44 AM  Bethanie Dicker, [phone]

## 2011-09-24 ENCOUNTER — Telehealth: Payer: Self-pay | Admitting: *Deleted

## 2011-09-24 NOTE — Telephone Encounter (Signed)
No answer.  Left message to call office if questions or concerns. 

## 2011-09-29 ENCOUNTER — Encounter: Payer: Self-pay | Admitting: Internal Medicine

## 2012-10-25 ENCOUNTER — Other Ambulatory Visit: Payer: Self-pay | Admitting: Gynecology

## 2012-10-25 DIAGNOSIS — R928 Other abnormal and inconclusive findings on diagnostic imaging of breast: Secondary | ICD-10-CM

## 2012-11-07 ENCOUNTER — Ambulatory Visit
Admission: RE | Admit: 2012-11-07 | Discharge: 2012-11-07 | Disposition: A | Discharge status: Home / Self Care | Payer: 59 | Source: Ambulatory Visit | Attending: Gynecology | Admitting: Gynecology

## 2012-11-07 DIAGNOSIS — R928 Other abnormal and inconclusive findings on diagnostic imaging of breast: Secondary | ICD-10-CM

## 2012-11-07 IMAGING — US US BREAST*R*
1 series · 4 of 4 positions shown · non-contrast
Comparison: [DATE], [DATE], [DATE]

CLINICAL DATA: The patient returns for evaluation of a possible
mass in the right breast noted on recent screening study dated
[DATE] from [REDACTED].

DIGITAL DIAGNOSTIC RIGHT MAMMOGRAM  AND RIGHT BREAST ULTRASOUND:

[Series 1: us breast*right* · 4 of 4 slices shown]
[im 1/4]
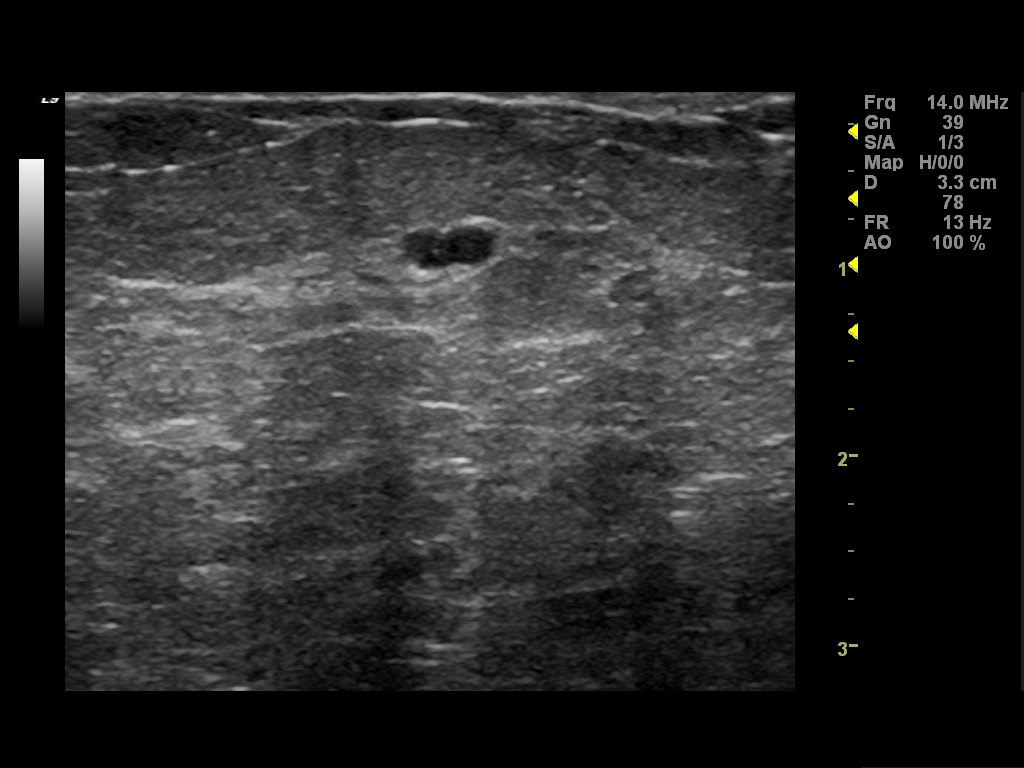
[im 2/4]
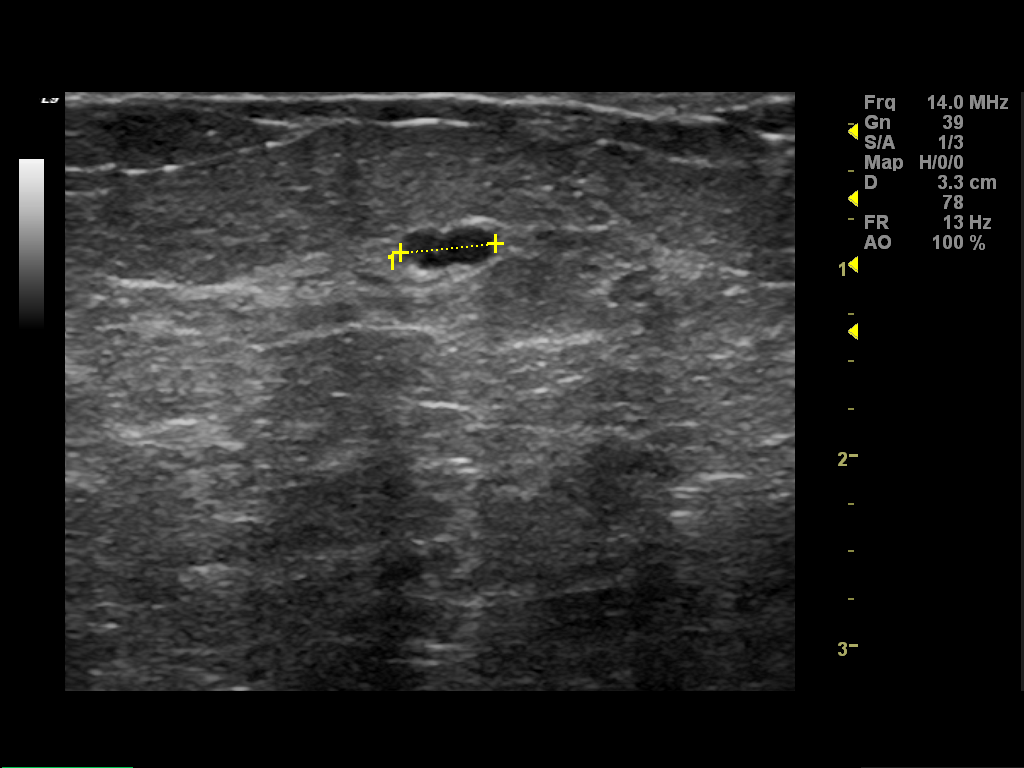
[im 3/4]
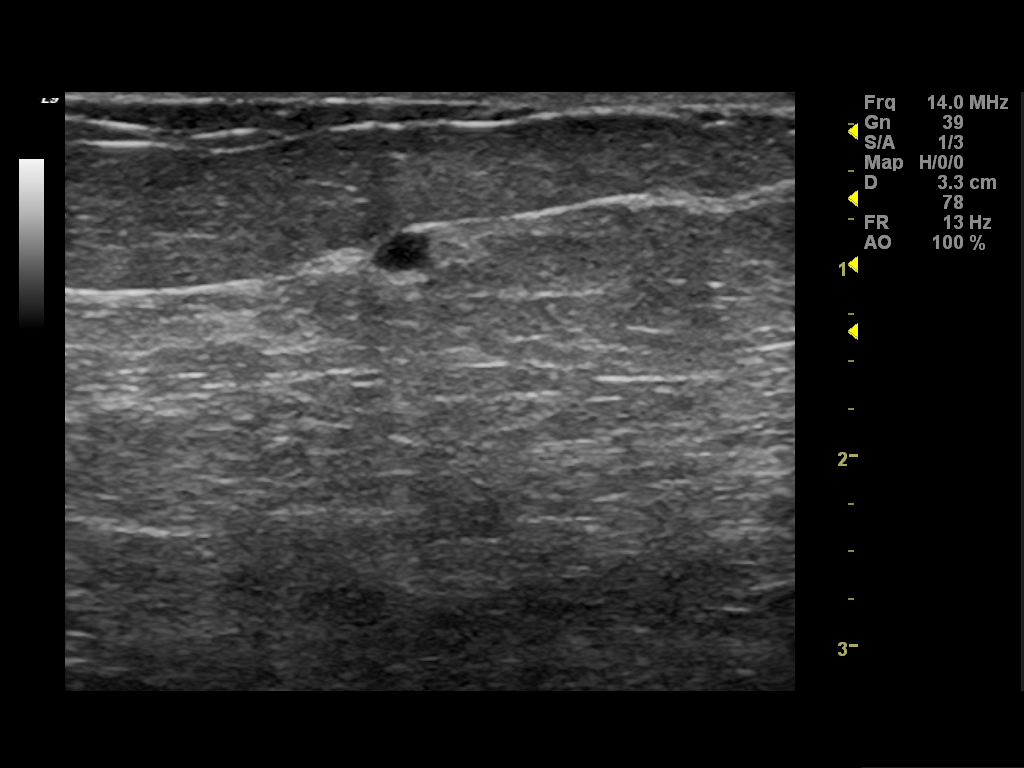
[im 4/4]
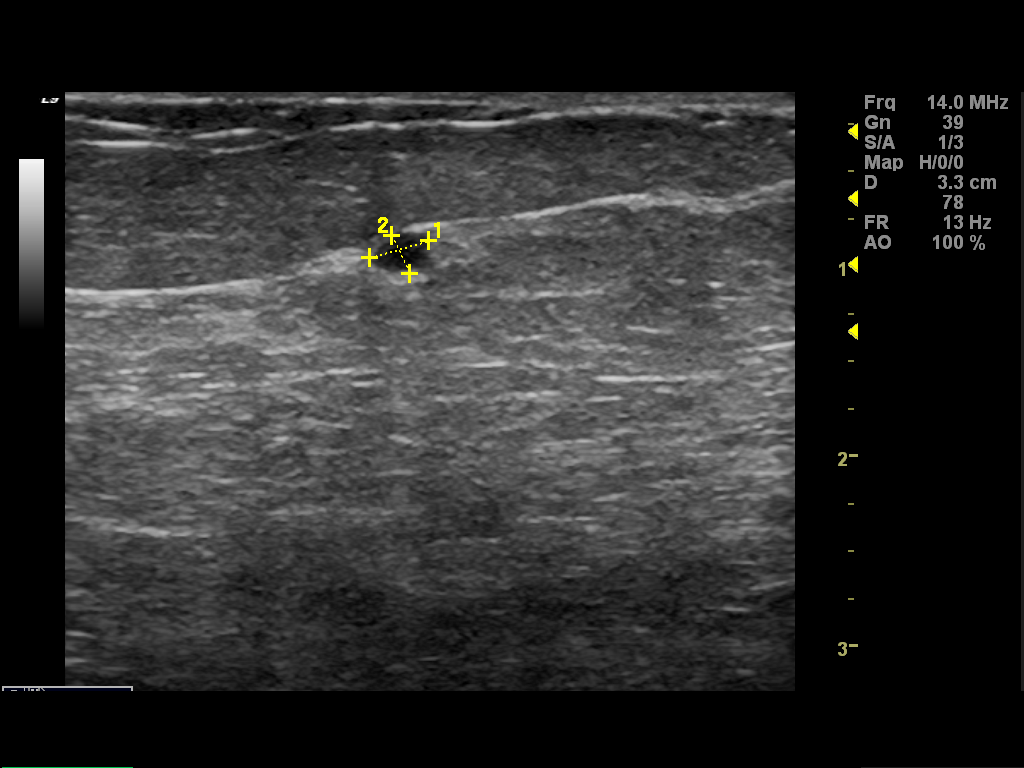

[4 of 4 positions shown; findings below may reference images not displayed]

FINDINGS: ACR Breast Density Category 2: There are scattered fibroglandular
densities.

Additional views confirm the presence of a small oval nodule in the
outer portion of the right breast at middle depth.

On physical exam, no mass is palpated in the right breast.

Ultrasound is performed, showing a cyst at 8 o'clock 3 cm from the
right nipple measuring 3 x 2 x 5 mm.
IMPRESSION: Right breast cyst.

RECOMMENDATION:
Yearly screening mammography is suggested.

I have discussed the findings and recommendations with the patient.
Results were also provided in writing at the conclusion of the
visit.  If applicable, a reminder letter will be sent to the
patient regarding the next appointment.

BI-RADS CATEGORY 2:  Benign finding(s).

## 2012-11-08 IMAGING — MG MM DIGITAL DIAGNOSTIC UNILAT*R*
2 series · 2 of 2 positions shown · non-contrast
Comparison: [DATE], [DATE], [DATE]

CLINICAL DATA: The patient returns for evaluation of a possible
mass in the right breast noted on recent screening study dated
[DATE] from [REDACTED].

DIGITAL DIAGNOSTIC RIGHT MAMMOGRAM  AND RIGHT BREAST ULTRASOUND:

[R CC]
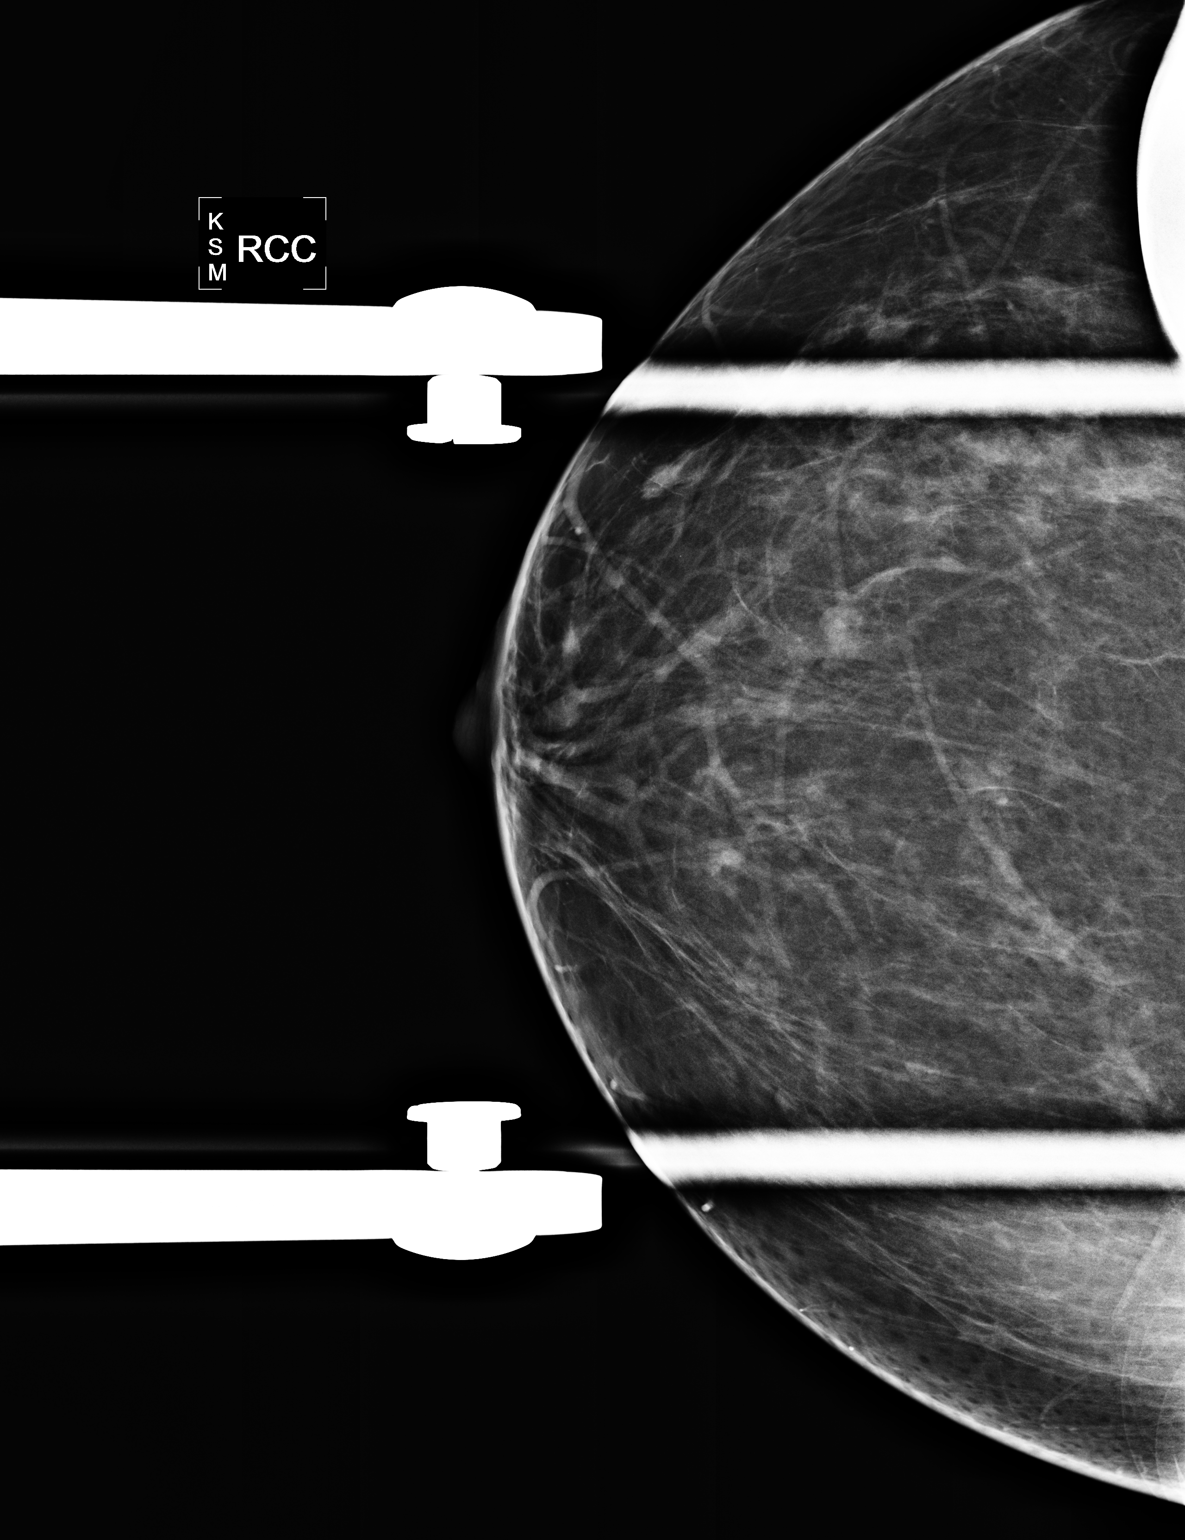

[R MLO]
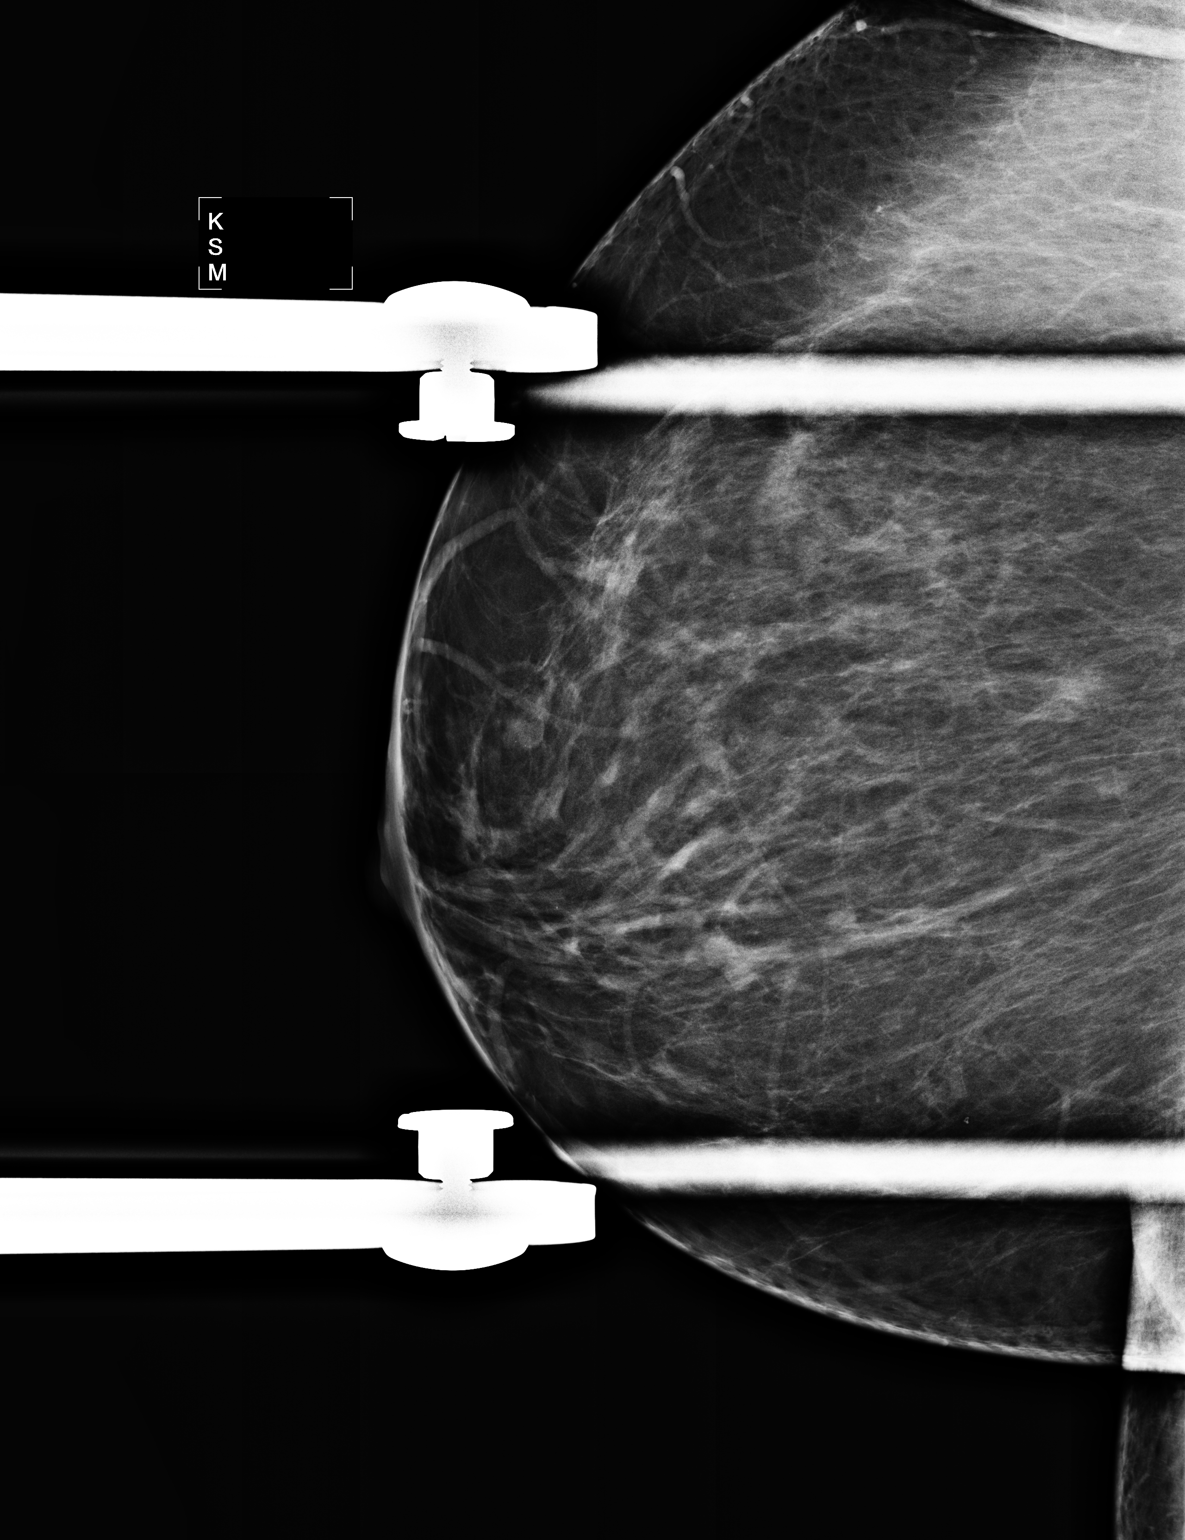

[2 of 2 positions shown; findings below may reference images not displayed]

FINDINGS: ACR Breast Density Category 2: There are scattered fibroglandular
densities.

Additional views confirm the presence of a small oval nodule in the
outer portion of the right breast at middle depth.

On physical exam, no mass is palpated in the right breast.

Ultrasound is performed, showing a cyst at 8 o'clock 3 cm from the
right nipple measuring 3 x 2 x 5 mm.
IMPRESSION: Right breast cyst.

RECOMMENDATION:
Yearly screening mammography is suggested.

I have discussed the findings and recommendations with the patient.
Results were also provided in writing at the conclusion of the
visit.  If applicable, a reminder letter will be sent to the
patient regarding the next appointment.

BI-RADS CATEGORY 2:  Benign finding(s).

## 2013-08-04 ENCOUNTER — Encounter: Payer: Self-pay | Admitting: Internal Medicine

## 2015-01-23 ENCOUNTER — Telehealth: Payer: Self-pay | Admitting: Internal Medicine

## 2015-01-23 NOTE — Telephone Encounter (Signed)
ok 

## 2015-01-23 NOTE — Telephone Encounter (Signed)
Dr. Raliegh Ip, pt would be a new patient, you saw her last 08/2011. Okay to schedule?

## 2015-01-23 NOTE — Telephone Encounter (Signed)
Pt last visit with dr Raliegh Ip was in feb 2013. Can I sch?

## 2015-01-23 NOTE — Telephone Encounter (Signed)
Pt has been sch

## 2015-03-25 ENCOUNTER — Encounter: Payer: Self-pay | Admitting: Internal Medicine

## 2015-03-25 ENCOUNTER — Ambulatory Visit (INDEPENDENT_AMBULATORY_CARE_PROVIDER_SITE_OTHER): Payer: 59 | Admitting: Internal Medicine

## 2015-03-25 VITALS — BP 144/90 | HR 73 | Temp 98.0°F | Resp 20 | Ht 64.5 in | Wt 248.0 lb

## 2015-03-25 DIAGNOSIS — Z0000 Encounter for general adult medical examination without abnormal findings: Secondary | ICD-10-CM

## 2015-03-25 LAB — CBC WITH DIFFERENTIAL/PLATELET
BASOS ABS: 0.1 10*3/uL (ref 0.0–0.1)
Basophils Relative: 0.8 % (ref 0.0–3.0)
EOS PCT: 1.3 % (ref 0.0–5.0)
Eosinophils Absolute: 0.1 10*3/uL (ref 0.0–0.7)
HEMATOCRIT: 38.1 % (ref 36.0–46.0)
Hemoglobin: 12.4 g/dL (ref 12.0–15.0)
LYMPHS ABS: 3 10*3/uL (ref 0.7–4.0)
LYMPHS PCT: 36.5 % (ref 12.0–46.0)
MCHC: 32.5 g/dL (ref 30.0–36.0)
MCV: 82.7 fl (ref 78.0–100.0)
MONOS PCT: 7.1 % (ref 3.0–12.0)
Monocytes Absolute: 0.6 10*3/uL (ref 0.1–1.0)
NEUTROS ABS: 4.4 10*3/uL (ref 1.4–7.7)
NEUTROS PCT: 54.3 % (ref 43.0–77.0)
PLATELETS: 237 10*3/uL (ref 150.0–400.0)
RBC: 4.6 Mil/uL (ref 3.87–5.11)
RDW: 13.9 % (ref 11.5–15.5)
WBC: 8.1 10*3/uL (ref 4.0–10.5)

## 2015-03-25 LAB — COMPREHENSIVE METABOLIC PANEL
ALT: 15 U/L (ref 0–35)
AST: 17 U/L (ref 0–37)
Albumin: 4 g/dL (ref 3.5–5.2)
Alkaline Phosphatase: 65 U/L (ref 39–117)
BILIRUBIN TOTAL: 0.8 mg/dL (ref 0.2–1.2)
BUN: 10 mg/dL (ref 6–23)
CALCIUM: 9.2 mg/dL (ref 8.4–10.5)
CO2: 28 meq/L (ref 19–32)
Chloride: 104 mEq/L (ref 96–112)
Creatinine, Ser: 0.81 mg/dL (ref 0.40–1.20)
GFR: 91.78 mL/min (ref >60.00)
GLUCOSE: 95 mg/dL (ref 70–99)
POTASSIUM: 4.5 meq/L (ref 3.5–5.1)
Sodium: 141 mEq/L (ref 135–145)
Total Protein: 7.2 g/dL (ref 6.0–8.3)

## 2015-03-25 LAB — TSH: TSH: 1.87 u[IU]/mL (ref 0.35–4.50)

## 2015-03-25 LAB — LIPID PANEL
CHOL/HDL RATIO: 4
Cholesterol: 180 mg/dL (ref 0–200)
HDL: 49.6 mg/dL (ref >39.00)
LDL CALC: 111 mg/dL — AB (ref 0–99)
NONHDL: 130.56
Triglycerides: 99 mg/dL (ref 0.0–149.0)
VLDL: 19.8 mg/dL (ref 0.0–40.0)

## 2015-03-25 NOTE — Progress Notes (Signed)
Subjective:    Patient ID: Tonya Bowers, female    DOB: 03/06/1951, 64 y.o.   MRN: [id number]  HPI   49 -year-old patient who is in today to reestablish with our practice. She is followed annually by gynecology. She is postmenopausal and takes hormone replacement therapy. She enjoys excellent health;  she takes omeprazole for occasional gastroesophageal reflux disease. She has a history colonic polyps which were hyperplastic in 2008.   She has donated blood periodically over the years, but not in the past 4 or 5 years   Past surgical history is pertinent for a cholecystectomy in 2002 she had a tonsillectomy in 1963 partial hysterectomy in 1997. This was performed due to have fibroids and endometriosis.  Social history married 3 children one grandson lifelong nonsmoker Works as a Counsellor  Family history details of her father's health unknown Mother age 28 history of hypertension 3 brothers one status post renal transplant One sister positive for diabetes obesity and hypertension  Wt Readings from Last 3 Encounters:  03/25/15 248 lb (112.492 kg)  09/23/11 247 lb (112.038 kg)  09/08/11 247 lb (112.038 kg)    Review of Systems  Constitutional: Negative for fever, appetite change, fatigue and unexpected weight change.  HENT: Negative for congestion, dental problem, ear pain, hearing loss, mouth sores, nosebleeds, sinus pressure, sore throat, tinnitus, trouble swallowing and voice change.   Eyes: Negative for photophobia, pain, redness and visual disturbance.  Respiratory: Negative for cough, chest tightness and shortness of breath.   Cardiovascular: Negative for chest pain, palpitations and leg swelling.  Gastrointestinal: Negative for nausea, vomiting, abdominal pain, diarrhea, constipation, blood in stool, abdominal distention and rectal pain.  Genitourinary: Negative for dysuria, urgency, frequency, hematuria, flank pain, vaginal bleeding, vaginal discharge, difficulty urinating,  genital sores, vaginal pain, menstrual problem and pelvic pain.  Musculoskeletal: Negative for back pain, arthralgias (mild left knee pain) and neck stiffness.  Skin: Negative for rash.  Neurological: Negative for dizziness, syncope, speech difficulty, weakness, light-headedness, numbness and headaches.  Hematological: Negative for adenopathy. Does not bruise/bleed easily.  Psychiatric/Behavioral: Negative for suicidal ideas, behavioral problems, self-injury, dysphoric mood and agitation. The patient is not nervous/anxious.        Objective:   Physical Exam  Constitutional: She is oriented to person, place, and time. She appears well-developed and well-nourished.  Overweight. Blood pressure 140/80  HENT:  Head: Normocephalic and atraumatic.  Right Ear: External ear normal.  Left Ear: External ear normal.  Mouth/Throat: Oropharynx is clear and moist.  Eyes: Conjunctivae and EOM are normal.  Neck: Normal range of motion. Neck supple. No JVD present. No thyromegaly present.  Cardiovascular: Normal rate, regular rhythm, normal heart sounds and intact distal pulses.   No murmur heard. Pulmonary/Chest: Effort normal and breath sounds normal. She has no wheezes. She has no rales.  Abdominal: Soft. Bowel sounds are normal. She exhibits no distension and no mass. There is no tenderness. There is no rebound and no guarding.  Musculoskeletal: Normal range of motion. She exhibits no edema or tenderness.  Neurological: She is alert and oriented to person, place, and time. She has normal reflexes. No cranial nerve deficit. She exhibits normal muscle tone. Coordination normal.  Skin: Skin is warm and dry. No rash noted.  Psychiatric: She has a normal mood and affect. Her behavior is normal.          Assessment & Plan:   Exogenous obesity.  Exercise weight loss.  All recommended Preventive health exam  Postmenopausal syndrome  History of colonic polyps- hyperplastic.  10 year intervals  recommended History of vitamin D deficiency.  Calcium and vitamin D supplements encouraged Gastroesophageal  reflux disease

## 2015-03-25 NOTE — Patient Instructions (Addendum)
Limit your sodium (Salt) intake  Please check your blood pressure on a regular basis.  If it is consistently greater than 150/90, please make an office appointment.    It is important that you exercise regularly, at least 20 minutes 3 to 4 times per week.  If you develop chest pain or shortness of breath seek  medical attention.  You need to lose weight.  Consider a lower calorie diet and regular exercise.  DASH Eating Plan DASH stands for "Dietary Approaches to Stop Hypertension." The DASH eating plan is a healthy eating plan that has been shown to reduce high blood pressure (hypertension). Additional health benefits may include reducing the risk of type 2 diabetes mellitus, heart disease, and stroke. The DASH eating plan may also help with weight loss. WHAT DO I NEED TO KNOW ABOUT THE DASH EATING PLAN? For the DASH eating plan, you will follow these general guidelines:  Choose foods with a percent daily value for sodium of less than 5% (as listed on the food label).  Use salt-free seasonings or herbs instead of table salt or sea salt.  Check with your health care provider or pharmacist before using salt substitutes.  Eat lower-sodium products, often labeled as "lower sodium" or "no salt added."  Eat fresh foods.  Eat more vegetables, fruits, and low-fat dairy products.  Choose whole grains. Look for the word "whole" as the first word in the ingredient list.  Choose fish and skinless chicken or [place] more often than red meat. Limit fish, poultry, and meat to 6 oz (170 g) each day.  Limit sweets, desserts, sugars, and sugary drinks.  Choose heart-healthy fats.  Limit cheese to 1 oz (28 g) per day.  Eat more home-cooked food and less restaurant, buffet, and fast food.  Limit fried foods.  Cook foods using methods other than frying.  Limit canned vegetables. If you do use them, rinse them well to decrease the sodium.  When eating at a restaurant, ask that your food be  prepared with less salt, or no salt if possible. WHAT FOODS CAN I EAT? Seek help from a dietitian for individual calorie needs. Grains Whole grain or whole wheat bread. Brown rice. Whole grain or whole wheat pasta. Quinoa, bulgur, and whole grain cereals. Low-sodium cereals. Corn or whole wheat flour tortillas. Whole grain cornbread. Whole grain crackers. Low-sodium crackers. Vegetables Fresh or frozen vegetables (raw, steamed, roasted, or grilled). Low-sodium or reduced-sodium tomato and vegetable juices. Low-sodium or reduced-sodium tomato sauce and paste. Low-sodium or reduced-sodium canned vegetables.  Fruits All fresh, canned (in natural juice), or frozen fruits. Meat and Other Protein Products Ground beef (85% or leaner), grass-fed beef, or beef trimmed of fat. Skinless chicken or [place]. Ground chicken or [place]. Pork trimmed of fat. All fish and seafood. Eggs. Dried beans, peas, or lentils. Unsalted nuts and seeds. Unsalted canned beans. Dairy Low-fat dairy products, such as skim or 1% milk, 2% or reduced-fat cheeses, low-fat ricotta or cottage cheese, or plain low-fat yogurt. Low-sodium or reduced-sodium cheeses. Fats and Oils Tub margarines without trans fats. Light or reduced-fat mayonnaise and salad dressings (reduced sodium). Avocado. Safflower, olive, or canola oils. Natural peanut or almond butter. Other Unsalted popcorn and pretzels. The items listed above may not be a complete list of recommended foods or beverages. Contact your dietitian for more options. WHAT FOODS ARE NOT RECOMMENDED? Grains White bread. White pasta. White rice. Refined cornbread. Bagels and croissants. Crackers that contain trans fat. Vegetables Creamed or fried vegetables. Vegetables  in a cheese sauce. Regular canned vegetables. Regular canned tomato sauce and paste. Regular tomato and vegetable juices. Fruits Dried fruits. Canned fruit in light or heavy syrup. Fruit juice. Meat and Other Protein  Products Fatty cuts of meat. Ribs, chicken wings, bacon, sausage, bologna, salami, chitterlings, fatback, hot dogs, bratwurst, and packaged luncheon meats. Salted nuts and seeds. Canned beans with salt. Dairy Whole or 2% milk, cream, half-and-half, and cream cheese. Whole-fat or sweetened yogurt. Full-fat cheeses or blue cheese. Nondairy creamers and whipped toppings. Processed cheese, cheese spreads, or cheese curds. Condiments Onion and garlic salt, seasoned salt, table salt, and sea salt. Canned and packaged gravies. Worcestershire sauce. Tartar sauce. Barbecue sauce. Teriyaki sauce. Soy sauce, including reduced sodium. Steak sauce. Fish sauce. Oyster sauce. Cocktail sauce. Horseradish. Ketchup and mustard. Meat flavorings and tenderizers. Bouillon cubes. Hot sauce. Tabasco sauce. Marinades. Taco seasonings. Relishes. Fats and Oils Butter, stick margarine, lard, shortening, ghee, and bacon fat. Coconut, palm kernel, or palm oils. Regular salad dressings. Other Pickles and olives. Salted popcorn and pretzels. The items listed above may not be a complete list of foods and beverages to avoid. Contact your dietitian for more information. WHERE CAN I FIND MORE INFORMATION? National Heart, Lung, and Blood Institute: travelstabloid.com Document Released: 06/11/2011 Document Revised: 11/06/2013 Document Reviewed: 04/26/2013 [facility] Patient Information 2015 [place], [place]. This information is not intended to replace advice given to you by your health care provider. Make sure you discuss any questions you have with your health care provider. Health Maintenance Adopting a healthy lifestyle and getting preventive care can go a long way to promote health and wellness. Talk with your health care provider about what schedule of regular examinations is right for you. This is a good chance for you to check in with your provider about disease prevention and staying healthy. In  between checkups, there are plenty of things you can do on your own. Experts have done a lot of research about which lifestyle changes and preventive measures are most likely to keep you healthy. Ask your health care provider for more information. WEIGHT AND DIET  Eat a healthy diet  Be sure to include plenty of vegetables, fruits, low-fat dairy products, and lean protein.  Do not eat a lot of foods high in solid fats, added sugars, or salt.  Get regular exercise. This is one of the most important things you can do for your health.  Most adults should exercise for at least 150 minutes each week. The exercise should increase your heart rate and make you sweat (moderate-intensity exercise).  Most adults should also do strengthening exercises at least twice a week. This is in addition to the moderate-intensity exercise.  Maintain a healthy weight  Body mass index (BMI) is a measurement that can be used to identify possible weight problems. It estimates body fat based on height and weight. Your health care provider can help determine your BMI and help you achieve or maintain a healthy weight.  For females 20 years of age and older:   A BMI below 18.5 is considered underweight.  A BMI of 18.5 to 24.9 is normal.  A BMI of 25 to 29.9 is considered overweight.  A BMI of 30 and above is considered obese.  Watch levels of cholesterol and blood lipids  You should start having your blood tested for lipids and cholesterol at 63 years of age, then have this test every 5 years.  You may need to have your cholesterol levels checked more often  if:  Your lipid or cholesterol levels are high.  You are older than 63 years of age.  You are at high risk for heart disease.  CANCER SCREENING   Lung Cancer  Lung cancer screening is recommended for adults 38-92 years old who are at high risk for lung cancer because of a history of smoking.  A yearly low-dose CT scan of the lungs is recommended  for people who:  Currently smoke.  Have quit within the past 15 years.  Have at least a 30-pack-year history of smoking. A pack year is smoking an average of one pack of cigarettes a day for 1 year.  Yearly screening should continue until it has been 15 years since you quit.  Yearly screening should stop if you develop a health problem that would prevent you from having lung cancer treatment.  Breast Cancer  Practice breast self-awareness. This means understanding how your breasts normally appear and feel.  It also means doing regular breast self-exams. Let your health care provider know about any changes, no matter how small.  If you are in your 20s or 30s, you should have a clinical breast exam (CBE) by a health care provider every 1-3 years as part of a regular health exam.  If you are 39 or older, have a CBE every year. Also consider having a breast X-ray (mammogram) every year.  If you have a family history of breast cancer, talk to your health care provider about genetic screening.  If you are at high risk for breast cancer, talk to your health care provider about having an MRI and a mammogram every year.  Breast cancer gene (BRCA) assessment is recommended for women who have family members with BRCA-related cancers. BRCA-related cancers include:  Breast.  Ovarian.  Tubal.  Peritoneal cancers.  Results of the assessment will determine the need for genetic counseling and BRCA1 and BRCA2 testing. Cervical Cancer Routine pelvic examinations to screen for cervical cancer are no longer recommended for nonpregnant women who are considered low risk for cancer of the pelvic organs (ovaries, uterus, and vagina) and who do not have symptoms. A pelvic examination may be necessary if you have symptoms including those associated with pelvic infections. Ask your health care provider if a screening pelvic exam is right for you.   The Pap test is the screening test for cervical cancer  for women who are considered at risk.  If you had a hysterectomy for a problem that was not cancer or a condition that could lead to cancer, then you no longer need Pap tests.  If you are older than 65 years, and you have had normal Pap tests for the past 10 years, you no longer need to have Pap tests.  If you have had past treatment for cervical cancer or a condition that could lead to cancer, you need Pap tests and screening for cancer for at least 20 years after your treatment.  If you no longer get a Pap test, assess your risk factors if they change (such as having a new sexual partner). This can affect whether you should start being screened again.  Some women have medical problems that increase their chance of getting cervical cancer. If this is the case for you, your health care provider may recommend more frequent screening and Pap tests.  The human papillomavirus (HPV) test is another test that may be used for cervical cancer screening. The HPV test looks for the virus that can cause cell changes in  the cervix. The cells collected during the Pap test can be tested for HPV.  The HPV test can be used to screen women 46 years of age and older. Getting tested for HPV can extend the interval between normal Pap tests from three to five years.  An HPV test also should be used to screen women of any age who have unclear Pap test results.  After 63 years of age, women should have HPV testing as often as Pap tests.  Colorectal Cancer  This type of cancer can be detected and often prevented.  Routine colorectal cancer screening usually begins at 63 years of age and continues through 63 years of age.  Your health care provider may recommend screening at an earlier age if you have risk factors for colon cancer.  Your health care provider may also recommend using home test kits to check for hidden blood in the stool.  A small camera at the end of a tube can be used to examine your colon  directly (sigmoidoscopy or colonoscopy). This is done to check for the earliest forms of colorectal cancer.  Routine screening usually begins at age 73.  Direct examination of the colon should be repeated every 5-10 years through 63 years of age. However, you may need to be screened more often if early forms of precancerous polyps or small growths are found. Skin Cancer  Check your skin from head to toe regularly.  Tell your health care provider about any new moles or changes in moles, especially if there is a change in a mole's shape or color.  Also tell your health care provider if you have a mole that is larger than the size of a pencil eraser.  Always use sunscreen. Apply sunscreen liberally and repeatedly throughout the day.  Protect yourself by wearing long sleeves, pants, a wide-brimmed hat, and sunglasses whenever you are outside. HEART DISEASE, DIABETES, AND HIGH BLOOD PRESSURE   Have your blood pressure checked at least every 1-2 years. High blood pressure causes heart disease and increases the risk of stroke.  If you are between 93 years and 23 years old, ask your health care provider if you should take aspirin to prevent strokes.  Have regular diabetes screenings. This involves taking a blood sample to check your fasting blood sugar level.  If you are at a normal weight and have a low risk for diabetes, have this test once every three years after 63 years of age.  If you are overweight and have a high risk for diabetes, consider being tested at a younger age or more often. PREVENTING INFECTION  Hepatitis B  If you have a higher risk for hepatitis B, you should be screened for this virus. You are considered at high risk for hepatitis B if:  You were born in a country where hepatitis B is common. Ask your health care provider which countries are considered high risk.  Your parents were born in a high-risk country, and you have not been immunized against hepatitis B  (hepatitis B vaccine).  You have HIV or AIDS.  You use needles to inject street drugs.  You live with someone who has hepatitis B.  You have had sex with someone who has hepatitis B.  You get hemodialysis treatment.  You take certain medicines for conditions, including cancer, organ transplantation, and autoimmune conditions. Hepatitis C  Blood testing is recommended for:  Everyone born from [birth year] through 1964.  Anyone with known risk factors for hepatitis C. Sexually  transmitted infections (STIs)  You should be screened for sexually transmitted infections (STIs) including gonorrhea and chlamydia if:  You are sexually active and are younger than 63 years of age.  You are older than 63 years of age and your health care provider tells you that you are at risk for this type of infection.  Your sexual activity has changed since you were last screened and you are at an increased risk for chlamydia or gonorrhea. Ask your health care provider if you are at risk.  If you do not have HIV, but are at risk, it may be recommended that you take a prescription medicine daily to prevent HIV infection. This is called pre-exposure prophylaxis (PrEP). You are considered at risk if:  You are sexually active and do not regularly use condoms or know the HIV status of your partner(s).  You take drugs by injection.  You are sexually active with a partner who has HIV. Talk with your health care provider about whether you are at high risk of being infected with HIV. If you choose to begin PrEP, you should first be tested for HIV. You should then be tested every 3 months for as long as you are taking PrEP.  PREGNANCY   If you are premenopausal and you may become pregnant, ask your health care provider about preconception counseling.  If you may become pregnant, take 400 to 800 micrograms (mcg) of folic acid every day.  If you want to prevent pregnancy, talk to your health care provider about birth  control (contraception). OSTEOPOROSIS AND MENOPAUSE   Osteoporosis is a disease in which the bones lose minerals and strength with aging. This can result in serious bone fractures. Your risk for osteoporosis can be identified using a bone density scan.  If you are 69 years of age or older, or if you are at risk for osteoporosis and fractures, ask your health care provider if you should be screened.  Ask your health care provider whether you should take a calcium or vitamin D supplement to lower your risk for osteoporosis.  Menopause may have certain physical symptoms and risks.  Hormone replacement therapy may reduce some of these symptoms and risks. Talk to your health care provider about whether hormone replacement therapy is right for you.  HOME CARE INSTRUCTIONS   Schedule regular health, dental, and eye exams.  Stay current with your immunizations.   Do not use any tobacco products including cigarettes, chewing tobacco, or electronic cigarettes.  If you are pregnant, do not drink alcohol.  If you are breastfeeding, limit how much and how often you drink alcohol.  Limit alcohol intake to no more than 1 drink per day for nonpregnant women. One drink equals 12 ounces of beer, 5 ounces of wine, or 1 ounces of hard liquor.  Do not use street drugs.  Do not share needles.  Ask your health care provider for help if you need support or information about quitting drugs.  Tell your health care provider if you often feel depressed.  Tell your health care provider if you have ever been abused or do not feel safe at home. Document Released: 01/05/2011 Document Revised: 11/06/2013 Document Reviewed: 05/24/2013 [facility] Patient Information 2015 [place], [place]. This information is not intended to replace advice given to you by your health care provider. Make sure you discuss any questions you have with your health care provider.

## 2015-03-25 NOTE — Progress Notes (Signed)
Pre visit review using our clinic review tool, if applicable. No additional management support is needed unless otherwise documented below in the visit note. 

## 2015-03-26 LAB — HEPATITIS C ANTIBODY: HCV Ab: NEGATIVE

## 2015-03-26 LAB — HIV ANTIBODY (ROUTINE TESTING W REFLEX): HIV: NONREACTIVE

## 2015-09-03 ENCOUNTER — Encounter: Payer: Self-pay | Admitting: Internal Medicine

## 2015-09-03 ENCOUNTER — Ambulatory Visit (INDEPENDENT_AMBULATORY_CARE_PROVIDER_SITE_OTHER): Payer: 59 | Admitting: Internal Medicine

## 2015-09-03 VITALS — BP 176/90 | HR 76 | Temp 98.1°F | Resp 20 | Ht 64.5 in | Wt 251.0 lb

## 2015-09-03 DIAGNOSIS — I10 Essential (primary) hypertension: Secondary | ICD-10-CM | POA: Insufficient documentation

## 2015-09-03 MED ORDER — LISINOPRIL-HYDROCHLOROTHIAZIDE 20-12.5 MG PO TABS: 1.0000 | ORAL_TABLET | Freq: Every day | ORAL | Status: DC

## 2015-09-03 NOTE — Patient Instructions (Signed)
You need to lose weight.  Consider a lower calorie diet and regular exercise.  Limit your sodium (Salt) intake  Please check your blood pressure on a regular basis.  If it is consistently greater than 150/90, please make an office appointment.    It is important that you exercise regularly, at least 20 minutes 3 to 4 times per week.  If you develop chest pain or shortness of breath seek  medical attention.  DASH Eating Plan DASH stands for "Dietary Approaches to Stop Hypertension." The DASH eating plan is a healthy eating plan that has been shown to reduce high blood pressure (hypertension). Additional health benefits may include reducing the risk of type 2 diabetes mellitus, heart disease, and stroke. The DASH eating plan may also help with weight loss. WHAT DO I NEED TO KNOW ABOUT THE DASH EATING PLAN? For the DASH eating plan, you will follow these general guidelines:  Choose foods with a percent daily value for sodium of less than 5% (as listed on the food label).  Use salt-free seasonings or herbs instead of table salt or sea salt.  Check with your health care provider or pharmacist before using salt substitutes.  Eat lower-sodium products, often labeled as "lower sodium" or "no salt added."  Eat fresh foods.  Eat more vegetables, fruits, and low-fat dairy products.  Choose whole grains. Look for the word "whole" as the first word in the ingredient list.  Choose fish and skinless chicken or [place] more often than red meat. Limit fish, poultry, and meat to 6 oz (170 g) each day.  Limit sweets, desserts, sugars, and sugary drinks.  Choose heart-healthy fats.  Limit cheese to 1 oz (28 g) per day.  Eat more home-cooked food and less restaurant, buffet, and fast food.  Limit fried foods.  Cook foods using methods other than frying.  Limit canned vegetables. If you do use them, rinse them well to decrease the sodium.  When eating at a restaurant, ask that your food be  prepared with less salt, or no salt if possible. WHAT FOODS CAN I EAT? Seek help from a dietitian for individual calorie needs. Grains Whole grain or whole wheat bread. Brown rice. Whole grain or whole wheat pasta. Quinoa, bulgur, and whole grain cereals. Low-sodium cereals. Corn or whole wheat flour tortillas. Whole grain cornbread. Whole grain crackers. Low-sodium crackers. Vegetables Fresh or frozen vegetables (raw, steamed, roasted, or grilled). Low-sodium or reduced-sodium tomato and vegetable juices. Low-sodium or reduced-sodium tomato sauce and paste. Low-sodium or reduced-sodium canned vegetables.  Fruits All fresh, canned (in natural juice), or frozen fruits. Meat and Other Protein Products Ground beef (85% or leaner), grass-fed beef, or beef trimmed of fat. Skinless chicken or [place]. Ground chicken or [place]. Pork trimmed of fat. All fish and seafood. Eggs. Dried beans, peas, or lentils. Unsalted nuts and seeds. Unsalted canned beans. Dairy Low-fat dairy products, such as skim or 1% milk, 2% or reduced-fat cheeses, low-fat ricotta or cottage cheese, or plain low-fat yogurt. Low-sodium or reduced-sodium cheeses. Fats and Oils Tub margarines without trans fats. Light or reduced-fat mayonnaise and salad dressings (reduced sodium). Avocado. Safflower, olive, or canola oils. Natural peanut or almond butter. Other Unsalted popcorn and pretzels. The items listed above may not be a complete list of recommended foods or beverages. Contact your dietitian for more options. WHAT FOODS ARE NOT RECOMMENDED? Grains White bread. White pasta. White rice. Refined cornbread. Bagels and croissants. Crackers that contain trans fat. Vegetables Creamed or fried vegetables. Vegetables  in a cheese sauce. Regular canned vegetables. Regular canned tomato sauce and paste. Regular tomato and vegetable juices. Fruits Dried fruits. Canned fruit in light or heavy syrup. Fruit juice. Meat and Other Protein  Products Fatty cuts of meat. Ribs, chicken wings, bacon, sausage, bologna, salami, chitterlings, fatback, hot dogs, bratwurst, and packaged luncheon meats. Salted nuts and seeds. Canned beans with salt. Dairy Whole or 2% milk, cream, half-and-half, and cream cheese. Whole-fat or sweetened yogurt. Full-fat cheeses or blue cheese. Nondairy creamers and whipped toppings. Processed cheese, cheese spreads, or cheese curds. Condiments Onion and garlic salt, seasoned salt, table salt, and sea salt. Canned and packaged gravies. Worcestershire sauce. Tartar sauce. Barbecue sauce. Teriyaki sauce. Soy sauce, including reduced sodium. Steak sauce. Fish sauce. Oyster sauce. Cocktail sauce. Horseradish. Ketchup and mustard. Meat flavorings and tenderizers. Bouillon cubes. Hot sauce. Tabasco sauce. Marinades. Taco seasonings. Relishes. Fats and Oils Butter, stick margarine, lard, shortening, ghee, and bacon fat. Coconut, palm kernel, or palm oils. Regular salad dressings. Other Pickles and olives. Salted popcorn and pretzels. The items listed above may not be a complete list of foods and beverages to avoid. Contact your dietitian for more information. WHERE CAN I FIND MORE INFORMATION? National Heart, Lung, and Blood Institute: travelstabloid.com   This information is not intended to replace advice given to you by your health care provider. Make sure you discuss any questions you have with your health care provider.   Document Released: 06/11/2011 Document Revised: 07/13/2014 Document Reviewed: 04/26/2013 [employer] Interactive Patient Education [employer].

## 2015-09-03 NOTE — Progress Notes (Signed)
Pre visit review using our clinic review tool, if applicable. No additional management support is needed unless otherwise documented below in the visit note. 

## 2015-09-03 NOTE — Progress Notes (Signed)
   Subjective:    Patient ID: Tonya Bowers, female    DOB: May 20, 1951, 65 y.o.   MRN: [id number]  HPI  BP Readings from Last 3 Encounters:  09/03/15 176/90  03/25/15 144/90  09/23/11 66/60   65 year old patient who has a history of obesity.  She has a family history of hypertension.  For the past 4 weeks she has been monitoring blood pressure readings at home with consistently high readings.  Systolics are as high as [id number].  She has had some mild intermittent headaches and dizziness.  Past Medical History  Diagnosis Date  . Diverticulosis   . Hyperplastic colon polyp 09/09/06  . GERD (gastroesophageal reflux disease)     Social History   Social History  . Marital Status: Married    Spouse Name: N/A  . Number of Children: N/A  . Years of Education: N/A   Occupational History  . Not on file.   Social History Main Topics  . Smoking status: Never Smoker   . Smokeless tobacco: Never Used  . Alcohol Use: No  . Drug Use: No  . Sexual Activity: Not on file   Other Topics Concern  . Not on file   Social History Narrative    Past Surgical History  Procedure Laterality Date  . Abdominal hysterectomy    . Cholecystectomy      Family History  Problem Relation Age of Onset  . Colon cancer Neg Hx   . Hypertension Mother   . Hypertension Father   . Kidney disease Brother     kidney transplant   . Hypertension Brother     Allergies  Allergen Reactions  . Penicillins     Current Outpatient Prescriptions on File Prior to Visit  Medication Sig Dispense Refill  . estradiol (ESTRACE) 1 MG tablet Take 1 mg by mouth daily.       No current facility-administered medications on file prior to visit.    BP 176/90 mmHg  Pulse 76  Temp(Src) 98.1 F (36.7 C) (Oral)  Resp 20  Ht 5' 4.5" (1.638 m)  Wt 251 lb (113.853 kg)  BMI 42.43 kg/m2  SpO2 98%  LMP 07/07/1995      Review of Systems  Constitutional: Negative.   HENT: Negative for congestion, dental problem,  hearing loss, rhinorrhea, sinus pressure, sore throat and tinnitus.   Eyes: Negative for pain, discharge and visual disturbance.  Respiratory: Negative for cough and shortness of breath.   Cardiovascular: Negative for chest pain, palpitations and leg swelling.  Gastrointestinal: Negative for nausea, vomiting, abdominal pain, diarrhea, constipation, blood in stool and abdominal distention.  Genitourinary: Negative for dysuria, urgency, frequency, hematuria, flank pain, vaginal bleeding, vaginal discharge, difficulty urinating, vaginal pain and pelvic pain.  Musculoskeletal: Negative for joint swelling, arthralgias and gait problem.  Skin: Negative for rash.  Neurological: Positive for dizziness and headaches. Negative for syncope, speech difficulty, weakness and numbness.  Hematological: Negative for adenopathy.  Psychiatric/Behavioral: Negative for behavioral problems, dysphoric mood and agitation. The patient is not nervous/anxious.        Objective:   Physical Exam  Constitutional: She appears well-developed and well-nourished. No distress.  Weight 251 Blood pressure 190/90          Assessment & Plan:   Essential hypertension.  Will start treatment with lisinopril hydrochlorothiazide Low-salt diet recommended Instructions for DASH diet Continue home blood pressure monitoring  Recheck 6 weeks

## 2015-10-30 ENCOUNTER — Ambulatory Visit (INDEPENDENT_AMBULATORY_CARE_PROVIDER_SITE_OTHER): Payer: 59 | Admitting: Internal Medicine

## 2015-10-30 ENCOUNTER — Encounter: Payer: Self-pay | Admitting: Internal Medicine

## 2015-10-30 VITALS — BP 126/90 | HR 67 | Temp 98.3°F | Resp 20 | Ht 64.5 in | Wt 250.0 lb

## 2015-10-30 DIAGNOSIS — R1012 Left upper quadrant pain: Secondary | ICD-10-CM | POA: Diagnosis not present

## 2015-10-30 DIAGNOSIS — I10 Essential (primary) hypertension: Secondary | ICD-10-CM | POA: Diagnosis not present

## 2015-10-30 MED ORDER — LOSARTAN POTASSIUM-HCTZ 100-25 MG PO TABS: 1.0000 | ORAL_TABLET | Freq: Every day | ORAL | Status: DC

## 2015-10-30 NOTE — Patient Instructions (Addendum)
Limit your sodium (Salt) intake  Please check your blood pressure on a regular basis.  If it is consistently greater than 150/90, please make an office appointment.  Return in 3 months for follow-up   abdominal ultrasound as discussed  .  Please call if there is any persistent cough  Call or return to clinic prn if these symptoms worsen or fail to improve as anticipated.

## 2015-10-30 NOTE — Progress Notes (Signed)
Subjective:    Patient ID: Tonya Bowers, female    DOB: 10-29-1950, 65 y.o.   MRN: [id number]  HPI   65 year old patient who has essential hypertension.  She was placed on combination therapy about 2 months ago due to stage II hypertension.  She has developed nonproductive cough.   She also complains of a fullness in the left flank, left upper quadrant area.  This was a complaint at the time of her last exam.  She feels that there is a sense of fullness and asymmetry in the left abdominal area.  She is concerned about serious intra-abdominal pathology  Past Medical History  Diagnosis Date  . Diverticulosis   . Hyperplastic colon polyp 09/09/06  . GERD (gastroesophageal reflux disease)      Social History   Social History  . Marital Status: Married    Spouse Name: N/A  . Number of Children: N/A  . Years of Education: N/A   Occupational History  . Not on file.   Social History Main Topics  . Smoking status: Never Smoker   . Smokeless tobacco: Never Used  . Alcohol Use: No  . Drug Use: No  . Sexual Activity: Not on file   Other Topics Concern  . Not on file   Social History Narrative    Past Surgical History  Procedure Laterality Date  . Abdominal hysterectomy    . Cholecystectomy      Family History  Problem Relation Age of Onset  . Colon cancer Neg Hx   . Hypertension Mother   . Hypertension Father   . Kidney disease Brother     kidney transplant   . Hypertension Brother     Allergies  Allergen Reactions  . Lisinopril Cough  . Penicillins     Current Outpatient Prescriptions on File Prior to Visit  Medication Sig Dispense Refill  . estradiol (ESTRACE) 1 MG tablet Take 1 mg by mouth daily.       No current facility-administered medications on file prior to visit.    BP 126/90 mmHg  Pulse 67  Temp(Src) 98.3 F (36.8 C) (Oral)  Resp 20  Ht 5' 4.5" (1.638 m)  Wt 250 lb (113.399 kg)  BMI 42.27 kg/m2  SpO2 98%  LMP 07/07/1995      Review of  Systems  Constitutional: Negative.   HENT: Negative for congestion, dental problem, hearing loss, rhinorrhea, sinus pressure, sore throat and tinnitus.   Eyes: Negative for pain, discharge and visual disturbance.  Respiratory: Positive for cough. Negative for shortness of breath.   Cardiovascular: Negative for chest pain, palpitations and leg swelling.  Gastrointestinal: Negative for nausea, vomiting, abdominal pain, diarrhea, constipation, blood in stool and abdominal distention.  Genitourinary: Positive for flank pain. Negative for dysuria, urgency, frequency, hematuria, vaginal bleeding, vaginal discharge, difficulty urinating, vaginal pain and pelvic pain.  Musculoskeletal: Negative for joint swelling, arthralgias and gait problem.  Skin: Negative for rash.  Neurological: Negative for dizziness, syncope, speech difficulty, weakness, numbness and headaches.  Hematological: Negative for adenopathy.  Psychiatric/Behavioral: Negative for behavioral problems, dysphoric mood and agitation. The patient is not nervous/anxious.        Objective:   Physical Exam  Constitutional: She appears well-developed and well-nourished. No distress.  Blood pressure 140/84  Pulmonary/Chest: Effort normal and breath sounds normal. No respiratory distress. She has no wheezes. She has no rales. She exhibits no tenderness.  Abdominal: She exhibits no distension. There is no tenderness.  Obese  No CVA tenderness  Assessment & Plan:   , essential hypertension  Ace associated cough.  Will substitute Hyzaar.  Patient aware that cough is an unlikely side effect of this medication as well .  Left upper quadrant and flank pain.  Will check a complete abdominal ultrasound to rule out significant pathology  .  Recheck 3 months

## 2015-10-30 NOTE — Progress Notes (Signed)
Pre visit review using our clinic review tool, if applicable. No additional management support is needed unless otherwise documented below in the visit note. 

## 2015-11-01 ENCOUNTER — Ambulatory Visit: Payer: 59 | Admitting: Internal Medicine

## 2015-11-08 ENCOUNTER — Ambulatory Visit
Admission: RE | Admit: 2015-11-08 | Discharge: 2015-11-08 | Disposition: A | Discharge status: Home / Self Care | Payer: 59 | Source: Ambulatory Visit | Attending: Internal Medicine | Admitting: Internal Medicine

## 2015-11-08 DIAGNOSIS — R1012 Left upper quadrant pain: Secondary | ICD-10-CM

## 2015-11-08 IMAGING — US US ABDOMEN COMPLETE
1 series · 14 of 25 positions shown · non-contrast
Comparison: Abdominal and pelvic CT scan [DATE]

CLINICAL DATA: Left flank and left upper quadrant discomfort for
the past 6 months; history of previous cholecystectomy.

EXAM:
ABDOMEN ULTRASOUND COMPLETE

[Series 1: us abdomen complete · 0.28mm/px · 14 of 65 slices shown]
[im 1/65]
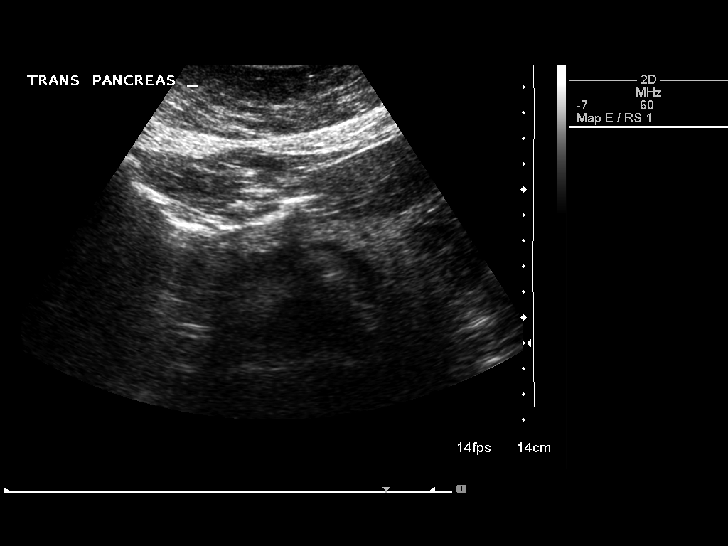
[im 6/65]
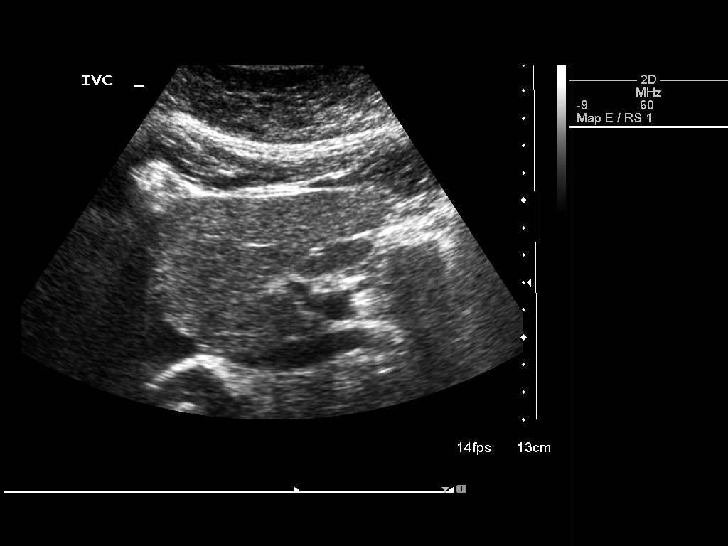
[im 11/65]
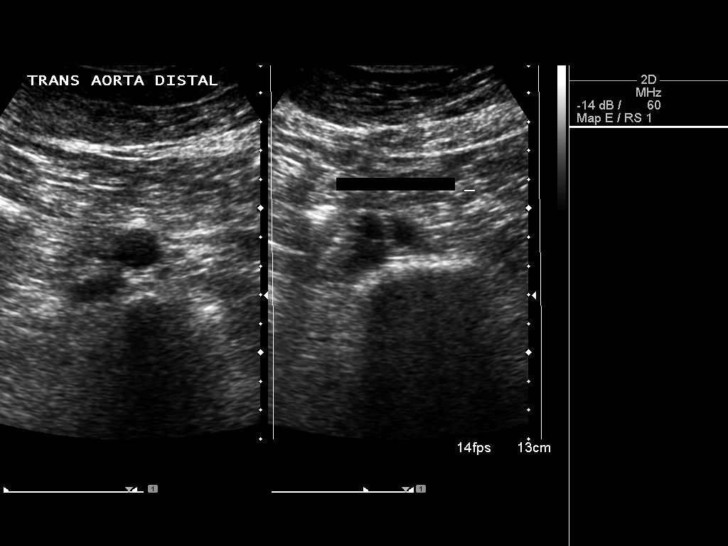
[im 17/65]
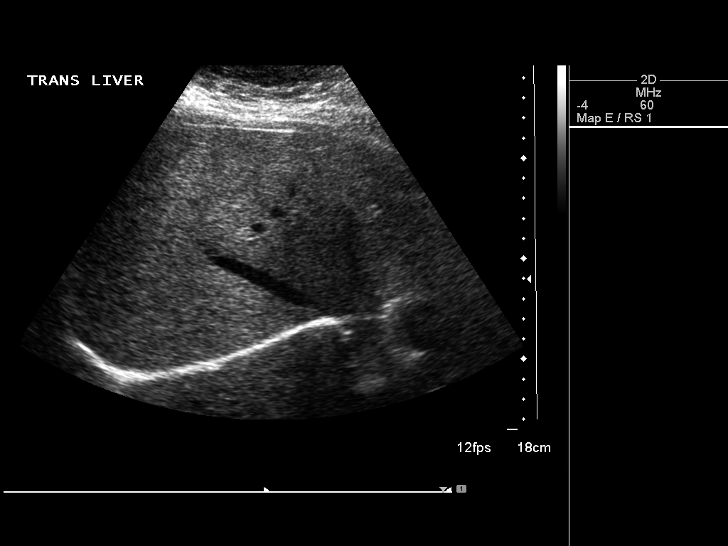
[im 22/65]
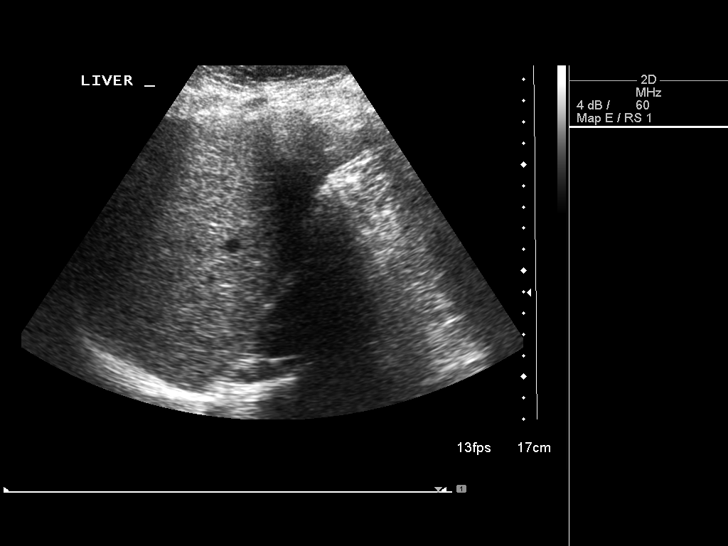
[im 25/65]
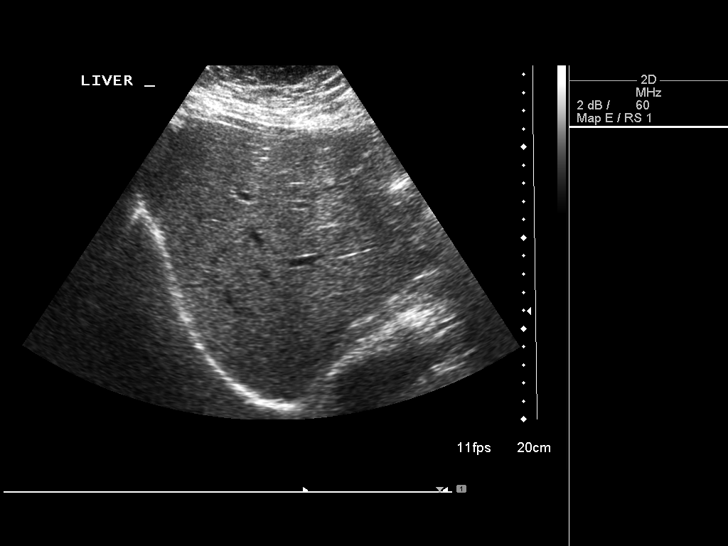
[im 30/65]
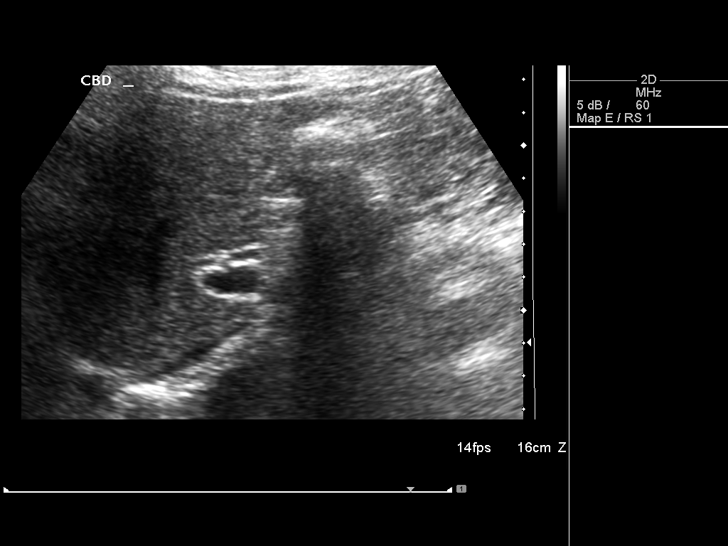
[im 35/65]
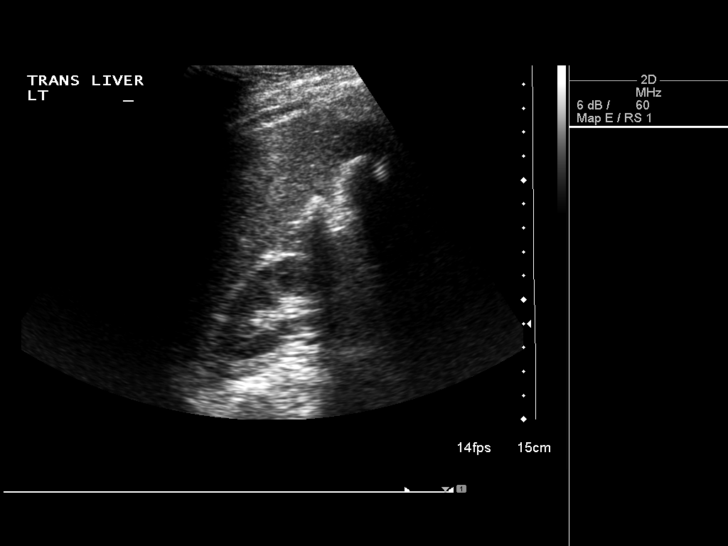
[im 41/65]
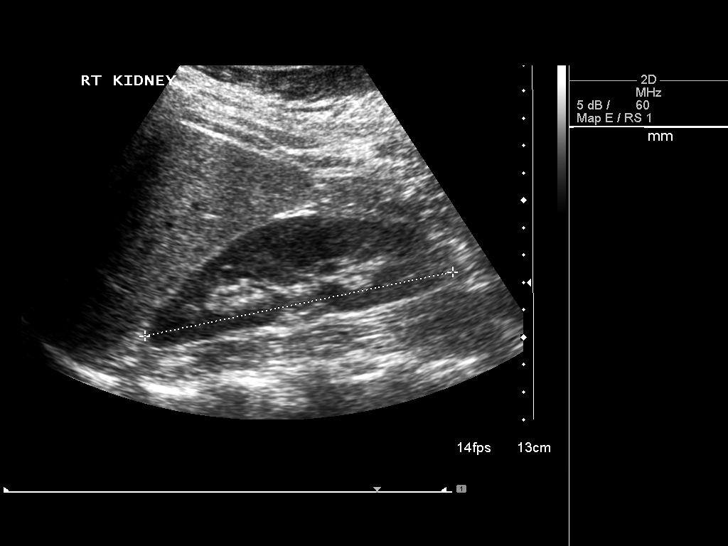
[im 43/65]
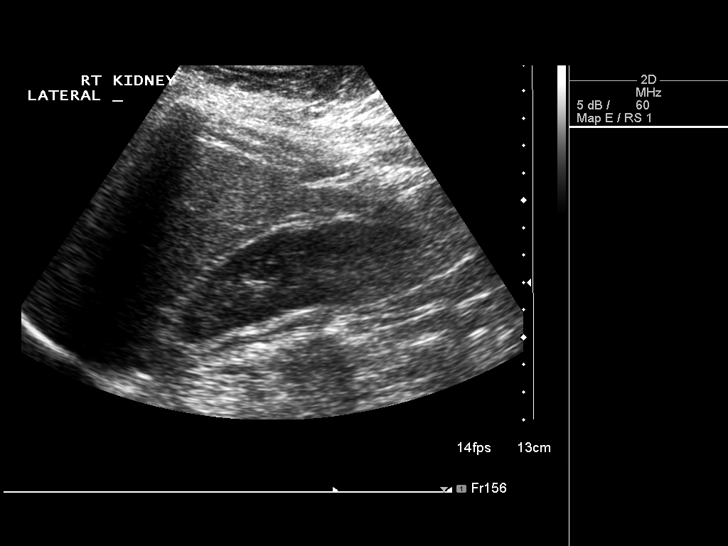
[im 49/65]
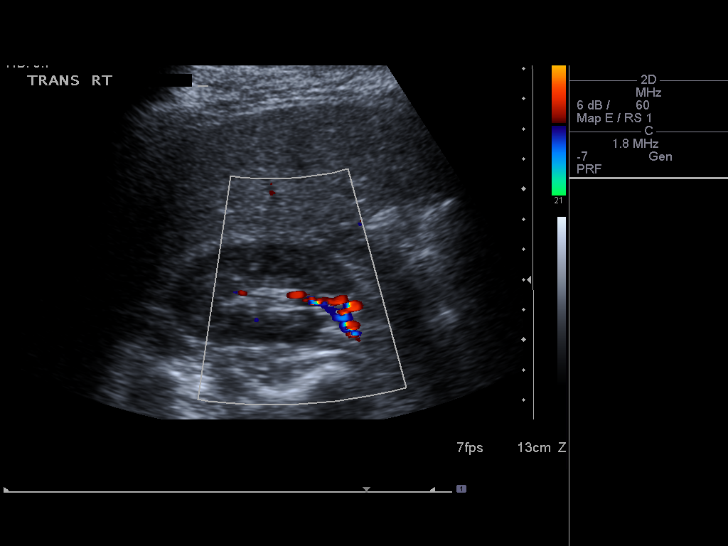
[im 54/65]
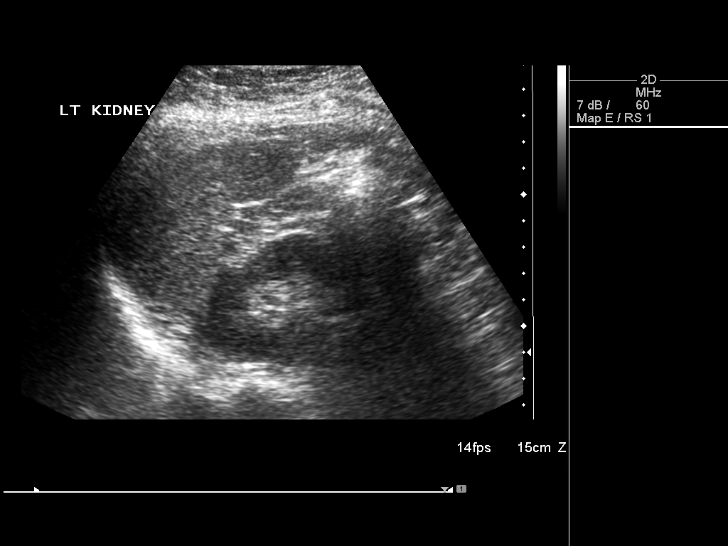
[im 59/65]
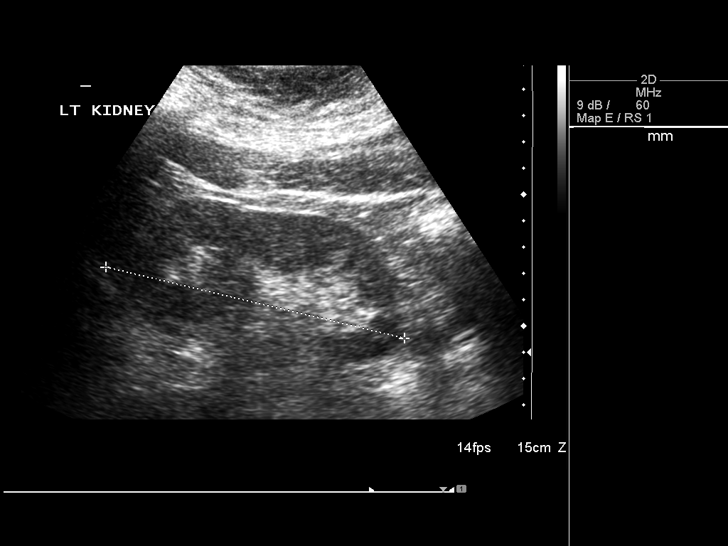
[im 65/65]
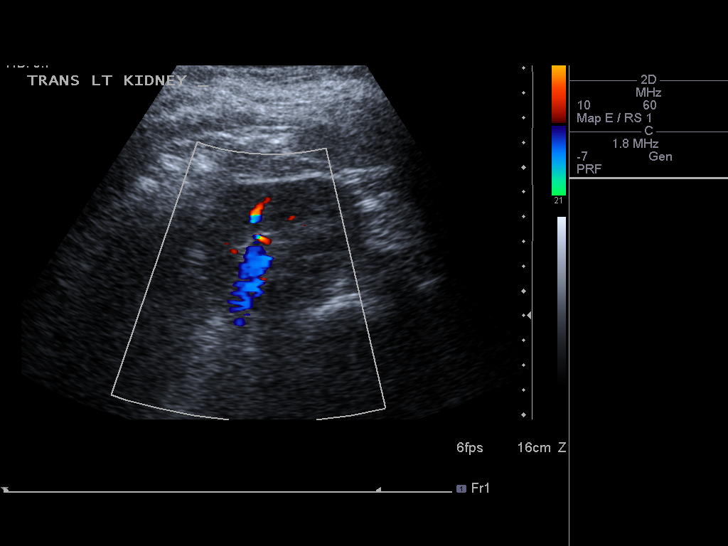

[14 of 25 positions shown; findings below may reference images not displayed]

FINDINGS: Gallbladder: The gallbladder is surgically absent.

Common bile duct: Diameter: 3.6 mm

Liver: The hepatic echotexture is normal. There is no focal mass or
ductal dilation. The surface contour appears normal.

IVC: No abnormality visualized.

Pancreas: Bowel gas limits evaluation of the pancreatic head. The
pancreatic body and tail appear normal.

Spleen: Size and appearance within normal limits.

Right Kidney: Length: 11.5 cm. Echogenicity within normal limits. No
mass or hydronephrosis visualized.

Left Kidney: Length: 11.7 cm. Echogenicity within normal limits. No
mass or hydronephrosis visualized.

Abdominal aorta: No aneurysm visualized.

Other findings: There is no ascites.
IMPRESSION: 1. No findings are demonstrated to explain the patient's symptoms.
2. The gallbladder is surgically absent. There is no acute
hepatobiliary abnormality. The spleen is normal in size.
3. No kidney stones or other renal abnormality is observed.

## 2015-11-11 ENCOUNTER — Telehealth: Payer: Self-pay | Admitting: Internal Medicine

## 2015-11-11 NOTE — Telephone Encounter (Signed)
Discontinue Hyzaar Chlorthalidone 25 mg #90 one daily  Office visit 3-4 weeks

## 2015-11-11 NOTE — Telephone Encounter (Signed)
Pt states he bp med was just changed 4/26 due to pt having a cough with it. Pt was prescribed  losartan-hydrochlorothiazide (HYZAAR) 100-25 MG tablet  Pt states she this med is also giving her a cough.  Please advise.   [facility]/ [address]

## 2015-11-12 ENCOUNTER — Other Ambulatory Visit: Payer: Self-pay | Admitting: Geriatric Medicine

## 2015-11-12 MED ORDER — CHLORTHALIDONE 25 MG PO TABS: 25.0000 mg | ORAL_TABLET | Freq: Every day | ORAL | Status: DC

## 2015-11-12 NOTE — Telephone Encounter (Signed)
Sent in chlorthalidone 25 mg to [facility] on [address]. Patient is aware and will schedule an office visit in 3 to 4 weeks.

## 2015-12-10 ENCOUNTER — Ambulatory Visit (INDEPENDENT_AMBULATORY_CARE_PROVIDER_SITE_OTHER): Payer: 59 | Admitting: Internal Medicine

## 2015-12-10 ENCOUNTER — Encounter: Payer: Self-pay | Admitting: Internal Medicine

## 2015-12-10 VITALS — BP 130/86 | HR 66 | Temp 98.0°F | Resp 20 | Ht 64.5 in | Wt 248.0 lb

## 2015-12-10 DIAGNOSIS — I10 Essential (primary) hypertension: Secondary | ICD-10-CM | POA: Diagnosis not present

## 2015-12-10 NOTE — Progress Notes (Signed)
Subjective:    Patient ID: Tonya Bowers, female    DOB: [birth year], 65 y.o.   MRN: [id number]  HPI 65 year old patient who has essential hypertension.  The patient initially was placed on combination due to stage II hypertension.  She developed a cough on lisinopril.  This was discontinued and she was challenged with losartan.  She also felt she had cough on this medication.  Presently she is on chlorthalidone alone 25 mg. Last visit, she also complained of some left upper quadrant discomfort.  She continues to have vague fullness in the left flank area.  She did have a complete abdominal ultrasound that was nonrevealing.  Symptoms have not worsened  Past Medical History  Diagnosis Date  . Diverticulosis   . Hyperplastic colon polyp 09/09/06  . GERD (gastroesophageal reflux disease)      Social History   Social History  . Marital Status: Married    Spouse Name: N/A  . Number of Children: N/A  . Years of Education: N/A   Occupational History  . Not on file.   Social History Main Topics  . Smoking status: Never Smoker   . Smokeless tobacco: Never Used  . Alcohol Use: No  . Drug Use: No  . Sexual Activity: Not on file   Other Topics Concern  . Not on file   Social History Narrative    Past Surgical History  Procedure Laterality Date  . Abdominal hysterectomy    . Cholecystectomy      Family History  Problem Relation Age of Onset  . Colon cancer Neg Hx   . Hypertension Mother   . Hypertension Father   . Kidney disease Brother     kidney transplant   . Hypertension Brother     Allergies  Allergen Reactions  . Lisinopril Cough  . Penicillins     Current Outpatient Prescriptions on File Prior to Visit  Medication Sig Dispense Refill  . chlorthalidone (HYGROTON) 25 MG tablet Take 1 tablet (25 mg total) by mouth daily. 90 tablet 0  . estradiol (ESTRACE) 1 MG tablet Take 1 mg by mouth daily.       No current facility-administered medications on file prior to  visit.    BP 130/86 mmHg  Pulse 66  Temp(Src) 98 F (36.7 C) (Oral)  Resp 20  Ht 5' 4.5" (1.638 m)  Wt 248 lb (112.492 kg)  BMI 41.93 kg/m2  SpO2 98%  LMP 07/07/1995      Review of Systems  Constitutional: Negative.   HENT: Negative for congestion, dental problem, hearing loss, rhinorrhea, sinus pressure, sore throat and tinnitus.   Eyes: Negative for pain, discharge and visual disturbance.  Respiratory: Negative for cough and shortness of breath.   Cardiovascular: Negative for chest pain, palpitations and leg swelling.  Gastrointestinal: Negative for nausea, vomiting, abdominal pain, diarrhea, constipation, blood in stool and abdominal distention.       Vague abdominal fullness left flank area  Genitourinary: Negative for dysuria, urgency, frequency, hematuria, flank pain, vaginal bleeding, vaginal discharge, difficulty urinating, vaginal pain and pelvic pain.  Musculoskeletal: Negative for joint swelling, arthralgias and gait problem.  Skin: Negative for rash.  Neurological: Negative for dizziness, syncope, speech difficulty, weakness, numbness and headaches.  Hematological: Negative for adenopathy.  Psychiatric/Behavioral: Negative for behavioral problems, dysphoric mood and agitation. The patient is not nervous/anxious.        Objective:   Physical Exam  Constitutional: She is oriented to person, place, and time. She appears well-developed  and well-nourished.  Obese Blood pressure 136/84  HENT:  Head: Normocephalic.  Right Ear: External ear normal.  Left Ear: External ear normal.  Mouth/Throat: Oropharynx is clear and moist.  Eyes: Conjunctivae and EOM are normal. Pupils are equal, round, and reactive to light.  Neck: Normal range of motion. Neck supple. No thyromegaly present.  Cardiovascular: Normal rate, regular rhythm, normal heart sounds and intact distal pulses.   Pulmonary/Chest: Effort normal and breath sounds normal.  Abdominal: Soft. Bowel sounds are  normal. She exhibits no mass. There is no tenderness.  Musculoskeletal: Normal range of motion.  Lymphadenopathy:    She has no cervical adenopathy.  Neurological: She is alert and oriented to person, place, and time.  Skin: Skin is warm and dry. No rash noted.  Psychiatric: She has a normal mood and affect. Her behavior is normal.          Assessment & Plan:   Essential hypertension.  No change in therapy Obesity.  Weight loss encouraged  Recheck 6 months or as needed   Tonya Cowden, MD

## 2015-12-10 NOTE — Progress Notes (Signed)
Pre visit review using our clinic review tool, if applicable. No additional management support is needed unless otherwise documented below in the visit note. 

## 2015-12-10 NOTE — Patient Instructions (Signed)

## 2016-01-29 ENCOUNTER — Ambulatory Visit: Payer: 59 | Admitting: Internal Medicine

## 2016-02-07 ENCOUNTER — Other Ambulatory Visit: Payer: Self-pay | Admitting: Internal Medicine

## 2016-02-20 ENCOUNTER — Encounter: Payer: Self-pay | Admitting: Family Medicine

## 2016-02-20 ENCOUNTER — Ambulatory Visit (INDEPENDENT_AMBULATORY_CARE_PROVIDER_SITE_OTHER): Payer: 59 | Admitting: Family Medicine

## 2016-02-20 VITALS — BP 134/78 | HR 70 | Temp 98.0°F | Ht 64.5 in | Wt 248.5 lb

## 2016-02-20 DIAGNOSIS — R350 Frequency of micturition: Secondary | ICD-10-CM

## 2016-02-20 DIAGNOSIS — Z760 Encounter for issue of repeat prescription: Secondary | ICD-10-CM | POA: Diagnosis not present

## 2016-02-20 DIAGNOSIS — M545 Low back pain: Secondary | ICD-10-CM

## 2016-02-20 LAB — POCT URINALYSIS DIPSTICK
BILIRUBIN UA: NEGATIVE
Blood, UA: NEGATIVE
GLUCOSE UA: NEGATIVE
Ketones, UA: NEGATIVE
LEUKOCYTES UA: NEGATIVE
NITRITE UA: NEGATIVE
PH UA: 7
Protein, UA: NEGATIVE
Spec Grav, UA: 1.01
Urobilinogen, UA: 0.2

## 2016-02-20 MED ORDER — CHLORTHALIDONE 25 MG PO TABS: 25.0000 mg | ORAL_TABLET | Freq: Every day | ORAL | 1 refills | Status: DC

## 2016-02-20 NOTE — Progress Notes (Addendum)
Subjective:    Patient ID: Tonya Bowers, female    DOB: 27-Nov-1950, 65 y.o.   MRN: [id number]  HPI  Tonya Bowers is a 65 year old female who presents today with left lower back pain that started 4 days ago. Pain is described as aching and can be rated as high as a 5 at times with movement. She reports that the pain has remained constant and denies any aggravating or relieving factors. She denies any recent triggers.  Treatment at home ibuprofen 600 mg once daily before bed which provides benefit.  She denies fever, chills, sweats, radicular symptoms, weakness, numbness, or bowel/bladder incontinence.  Associated urinary frequency and urgency is noted. She denies hematuria or dysuria. No history of cancer/immunosuppression, IV drug use, or osteoporosis. She has recently been seen for discomfort in her left upper quadrant when she recently had a complete abdominal ultrasound that was normal. She states that these symptoms have not worsened in this area.   Review of Systems  Constitutional: Negative for chills, fatigue and fever.  Respiratory: Negative for cough, shortness of breath and wheezing.   Cardiovascular: Negative for chest pain and palpitations.  Gastrointestinal: Negative for abdominal pain, diarrhea, nausea and vomiting.  Genitourinary: Positive for frequency and urgency. Negative for difficulty urinating, dysuria, hematuria and pelvic pain.  Musculoskeletal: Positive for back pain. Negative for myalgias.  Skin: Negative for rash.  Neurological: Negative for dizziness, light-headedness, numbness and headaches.   Past Medical History:  Diagnosis Date  . Diverticulosis   . GERD (gastroesophageal reflux disease)   . Hyperplastic colon polyp 09/09/06     Social History   Social History  . Marital status: Married    Spouse name: N/A  . Number of children: N/A  . Years of education: N/A   Occupational History  . Not on file.   Social History Main Topics  . Smoking status:  Never Smoker  . Smokeless tobacco: Never Used  . Alcohol use No  . Drug use: No  . Sexual activity: Not on file   Other Topics Concern  . Not on file   Social History Narrative  . No narrative on file    Past Surgical History:  Procedure Laterality Date  . ABDOMINAL HYSTERECTOMY    . CHOLECYSTECTOMY      Family History  Problem Relation Age of Onset  . Colon cancer Neg Hx   . Hypertension Mother   . Hypertension Father   . Kidney disease Brother     kidney transplant   . Hypertension Brother     Allergies  Allergen Reactions  . Lisinopril Cough  . Penicillins     Current Outpatient Prescriptions on File Prior to Visit  Medication Sig Dispense Refill  . chlorthalidone (HYGROTON) 25 MG tablet TAKE 1 TABLET BY MOUTH EVERY DAY 90 tablet 1  . estradiol (ESTRACE) 1 MG tablet Take 1 mg by mouth daily.       No current facility-administered medications on file prior to visit.     BP (!) 144/76 (BP Location: Right Arm, Patient Position: Sitting, Cuff Size: Large)   Pulse 70   Temp 98 F (36.7 C) (Oral)   Ht 5' 4.5" (1.638 m)   Wt 248 lb 8 oz (112.7 kg)   LMP 07/07/1995   SpO2 98%   BMI 42.00 kg/m       Objective:   Physical Exam  Constitutional: She is oriented to person, place, and time. She appears well-developed and well-nourished.  Cardiovascular:  Normal rate, regular rhythm and intact distal pulses.   Pulmonary/Chest: Effort normal and breath sounds normal. She has no wheezes. She has no rales.  Abdominal: Soft. Bowel sounds are normal. She exhibits no distension. There is no CVA tenderness.  No suprapubic tenderness present  Musculoskeletal: She exhibits no edema.  Spine with mild curvature noted. No tenderness to vertebral process with palpation.  Paraspinous muscles are tender on the left side and patient notes this as "uncomfortable" ROM is full at lumbar sacral regions. Negative Straight Leg raise. No CVA tenderness present. No motor weakness noted    Neurological: She is alert and oriented to person, place, and time.  Skin: Skin is warm and dry. No rash noted.  Psychiatric: She has a normal mood and affect. Her behavior is normal. Judgment and thought content normal.       Assessment & Plan:   1. Left low back pain, with sciatica presence unspecified Exam and history support suspected muscle strain with tenderness to palpation and report of discomfort with movement. Complete US of abdomen 11/08/2015 did not reveal abnormalities.Treat with ibuprofen and heat and advised follow up if symptoms are not improved with treatment or new symptoms develop. If symptoms do not improve with ibuprofen, advised patient to follow up with PCP for further evaluation. Written instructions provided to patient.  2. Urinary frequency Negative UA; No CVA or suprapubic tenderness noted. Advised patient to increase fluids and follow up if symptoms do not improve, worsen, or she develops new symptoms. Low suspicion for UTI.  - POCT urinalysis dipstick  3. Medication refill Refill provided for patient. Discrepancy noted on pharmacy bottle stating no refills remain however PCP had provided one refill on this medication earlier this month. Advised patient to monitor her BP at home and follow up with her PCP as scheduled.  - chlorthalidone (HYGROTON) 25 MG tablet; Take 1 tablet (25 mg total) by mouth daily.  Dispense: 90 tablet; Refill: 1  Tonya Metz, FNP-C

## 2016-02-20 NOTE — Patient Instructions (Signed)
Please use ibuprofen 600 mg (this is 3 tablets) every 6 hours as needed for discomfort. If your symptoms do not improve, worsen, or you develop fever, chills, or new symptoms, please follow up for further evaluation. Feel better soon! It was a pleasure meeting you today!  Back Pain, Adult Back pain is very common in adults.The cause of back pain is rarely dangerous and the pain often gets better over time.The cause of your back pain may not be known. Some common causes of back pain include:  Strain of the muscles or ligaments supporting the spine.  Wear and tear (degeneration) of the spinal disks.  Arthritis.  Direct injury to the back. For many people, back pain may return. Since back pain is rarely dangerous, most people can learn to manage this condition on their own. HOME CARE INSTRUCTIONS Watch your back pain for any changes. The following actions may help to lessen any discomfort you are feeling:  Remain active. It is stressful on your back to sit or stand in one place for long periods of time. Do not sit, drive, or stand in one place for more than 30 minutes at a time. Take short walks on even surfaces as soon as you are able.Try to increase the length of time you walk each day.  Exercise regularly as directed by your health care provider. Exercise helps your back heal faster. It also helps avoid future injury by keeping your muscles strong and flexible.  Do not stay in bed.Resting more than 1-2 days can delay your recovery.  Pay attention to your body when you bend and lift. The most comfortable positions are those that put less stress on your recovering back. Always use proper lifting techniques, including:  Bending your knees.  Keeping the load close to your body.  Avoiding twisting.  Find a comfortable position to sleep. Use a firm mattress and lie on your side with your knees slightly bent. If you lie on your back, put a pillow under your knees.  Avoid feeling anxious  or stressed.Stress increases muscle tension and can worsen back pain.It is important to recognize when you are anxious or stressed and learn ways to manage it, such as with exercise.  Take medicines only as directed by your health care provider. Over-the-counter medicines to reduce pain and inflammation are often the most helpful.Your health care provider may prescribe muscle relaxant drugs.These medicines help dull your pain so you can more quickly return to your normal activities and healthy exercise.  Apply ice to the injured area:  Put ice in a plastic bag.  Place a towel between your skin and the bag.  Leave the ice on for 20 minutes, 2-3 times a day for the first 2-3 days. After that, ice and heat may be alternated to reduce pain and spasms.  Maintain a healthy weight. Excess weight puts extra stress on your back and makes it difficult to maintain good posture. SEEK MEDICAL CARE IF:  You have pain that is not relieved with rest or medicine.  You have increasing pain going down into the legs or buttocks.  You have pain that does not improve in one week.  You have night pain.  You lose weight.  You have a fever or chills. SEEK IMMEDIATE MEDICAL CARE IF:   You develop new bowel or bladder control problems.  You have unusual weakness or numbness in your arms or legs.  You develop nausea or vomiting.  You develop abdominal pain.  You feel faint.  This information is not intended to replace advice given to you by your health care provider. Make sure you discuss any questions you have with your health care provider.   Document Released: 06/22/2005 Document Revised: 07/13/2014 Document Reviewed: 10/24/2013 Elsevier Interactive Patient Education [employer].

## 2016-02-20 NOTE — Progress Notes (Signed)
Pre visit review using our clinic review tool, if applicable. No additional management support is needed unless otherwise documented below in the visit note. 

## 2016-02-21 ENCOUNTER — Telehealth: Payer: Self-pay | Admitting: Family Medicine

## 2016-02-21 NOTE — Telephone Encounter (Signed)
Contacted patient to evaluate if her symptoms have improved. She has used an OTC muscle pain spray which has provided benefit. Advised her to follow up if symptoms do not continue to improve or she develops new symptoms. Also recommended that she follow up with her PCP as scheduled or sooner if needed

## 2016-06-10 ENCOUNTER — Encounter: Payer: Self-pay | Admitting: Internal Medicine

## 2016-06-10 ENCOUNTER — Ambulatory Visit (INDEPENDENT_AMBULATORY_CARE_PROVIDER_SITE_OTHER): Payer: 59 | Admitting: Internal Medicine

## 2016-06-10 VITALS — BP 136/90 | HR 78 | Temp 98.1°F | Ht 64.5 in | Wt 240.4 lb

## 2016-06-10 DIAGNOSIS — I10 Essential (primary) hypertension: Secondary | ICD-10-CM

## 2016-06-10 DIAGNOSIS — Z23 Encounter for immunization: Secondary | ICD-10-CM

## 2016-06-10 NOTE — Progress Notes (Signed)
Subjective:    Patient ID: Tonya Bowers, female    DOB: 03/03/1951, 65 y.o.   MRN: [id number]  HPI  65 year old patient who is seen today for follow-up of essential hypertension. She is doing well except for persistent left upper quadrant and left flank discomfort.  This has been present really for a number of years No clinical change Otherwise, doing well.  She does monitor home blood pressure readings with nice results  Past Medical History:  Diagnosis Date  . Diverticulosis   . GERD (gastroesophageal reflux disease)   . Hyperplastic colon polyp 09/09/06     Social History   Social History  . Marital status: Married    Spouse name: N/A  . Number of children: N/A  . Years of education: N/A   Occupational History  . Not on file.   Social History Main Topics  . Smoking status: Never Smoker  . Smokeless tobacco: Never Used  . Alcohol use No  . Drug use: No  . Sexual activity: Not on file   Other Topics Concern  . Not on file   Social History Narrative  . No narrative on file    Past Surgical History:  Procedure Laterality Date  . ABDOMINAL HYSTERECTOMY    . CHOLECYSTECTOMY      Family History  Problem Relation Age of Onset  . Colon cancer Neg Hx   . Hypertension Mother   . Hypertension Father   . Kidney disease Brother     kidney transplant   . Hypertension Brother     Allergies  Allergen Reactions  . Lisinopril Cough  . Penicillins     Current Outpatient Prescriptions on File Prior to Visit  Medication Sig Dispense Refill  . chlorthalidone (HYGROTON) 25 MG tablet Take 1 tablet (25 mg total) by mouth daily. 90 tablet 1  . estradiol (ESTRACE) 1 MG tablet Take 1 mg by mouth daily.       No current facility-administered medications on file prior to visit.     BP 136/90 (BP Location: Left Arm, Patient Position: Sitting, Cuff Size: Large)   Pulse 78   Temp 98.1 F (36.7 C) (Oral)   Ht 5' 4.5" (1.638 m)   Wt 240 lb 6.1 oz (109 kg)   LMP  07/07/1995   SpO2 98%   BMI 40.62 kg/m     Review of Systems  Constitutional: Negative.   HENT: Negative for congestion, dental problem, hearing loss, rhinorrhea, sinus pressure, sore throat and tinnitus.   Eyes: Negative for pain, discharge and visual disturbance.  Respiratory: Negative for cough and shortness of breath.   Cardiovascular: Negative for chest pain, palpitations and leg swelling.  Gastrointestinal: Negative for abdominal distention, abdominal pain, blood in stool, constipation, diarrhea, nausea and vomiting.  Genitourinary: Negative for difficulty urinating, dysuria, flank pain, frequency, hematuria, pelvic pain, urgency, vaginal bleeding, vaginal discharge and vaginal pain.  Musculoskeletal: Negative for arthralgias, gait problem and joint swelling.  Skin: Negative for rash.  Neurological: Negative for dizziness, syncope, speech difficulty, weakness, numbness and headaches.  Hematological: Negative for adenopathy.  Psychiatric/Behavioral: Negative for agitation, behavioral problems and dysphoric mood. The patient is not nervous/anxious.        Objective:   Physical Exam  Constitutional: She is oriented to person, place, and time. She appears well-developed and well-nourished.  Blood pressure 140/80 Weight 241  HENT:  Head: Normocephalic.  Right Ear: External ear normal.  Left Ear: External ear normal.  Mouth/Throat: Oropharynx is clear and moist.  Eyes: Conjunctivae and EOM are normal. Pupils are equal, round, and reactive to light.  Neck: Normal range of motion. Neck supple. No thyromegaly present.  Cardiovascular: Normal rate, regular rhythm, normal heart sounds and intact distal pulses.   Pulmonary/Chest: Effort normal and breath sounds normal.  Abdominal: Soft. Bowel sounds are normal. She exhibits no mass. There is no tenderness.  Musculoskeletal: Normal range of motion.  Lymphadenopathy:    She has no cervical adenopathy.  Neurological: She is alert and  oriented to person, place, and time.  Skin: Skin is warm and dry. No rash noted.  Psychiatric: She has a normal mood and affect. Her behavior is normal.          Assessment & Plan:   Essential hypertension.  Continue diuretic therapy.  Weight loss encouraged.  Continue home blood pressure monitoring Chronic left upper quadrant and flank discomfort.  Patient will try efforts at weight loss, stretching and range of motion exercises.  Will report any clinical change, otherwise follow-up 6 months  Tonya Bowers Pilar Plate

## 2016-06-10 NOTE — Progress Notes (Signed)
Pre visit review using our clinic review tool, if applicable. No additional management support is needed unless otherwise documented below in the visit note. 

## 2016-06-10 NOTE — Patient Instructions (Signed)
Limit your sodium (Salt) intake    It is important that you exercise regularly, at least 20 minutes 3 to 4 times per week.  If you develop chest pain or shortness of breath seek  medical attention.  You need to lose weight.  Consider a lower calorie diet and regular exercise. 

## 2016-07-29 ENCOUNTER — Encounter: Payer: Self-pay | Admitting: Gastroenterology

## 2016-08-25 ENCOUNTER — Encounter: Payer: Self-pay | Admitting: Gastroenterology

## 2016-10-16 ENCOUNTER — Encounter: Payer: Self-pay | Admitting: Gastroenterology

## 2016-10-16 ENCOUNTER — Ambulatory Visit (AMBULATORY_SURGERY_CENTER): Payer: Self-pay

## 2016-10-16 VITALS — Ht 65.5 in | Wt 239.6 lb

## 2016-10-16 DIAGNOSIS — Z1211 Encounter for screening for malignant neoplasm of colon: Secondary | ICD-10-CM

## 2016-10-16 MED ORDER — SUPREP BOWEL PREP KIT 17.5-3.13-1.6 GM/177ML PO SOLN: 1.0000 | Freq: Once | ORAL | 0 refills | Status: AC

## 2016-10-16 NOTE — Progress Notes (Signed)
No allergies to eggs or soy No home oxygen No diet meds No past problems with anesthesia except PONV  Registered emmi

## 2016-10-30 ENCOUNTER — Encounter: Payer: Self-pay | Admitting: Gastroenterology

## 2016-10-30 ENCOUNTER — Ambulatory Visit (AMBULATORY_SURGERY_CENTER): Payer: 59 | Admitting: Gastroenterology

## 2016-10-30 VITALS — BP 134/66 | HR 70 | Temp 97.1°F | Resp 12 | Ht 64.0 in | Wt 240.0 lb

## 2016-10-30 DIAGNOSIS — Z1212 Encounter for screening for malignant neoplasm of rectum: Secondary | ICD-10-CM

## 2016-10-30 DIAGNOSIS — Z1211 Encounter for screening for malignant neoplasm of colon: Secondary | ICD-10-CM

## 2016-10-30 MED ORDER — SODIUM CHLORIDE 0.9 % IV SOLN: 500.0000 mL | INTRAVENOUS | Status: DC

## 2016-10-30 NOTE — Progress Notes (Signed)
Pt's states no medical or surgical changes since previsit or office visit. 

## 2016-10-30 NOTE — Patient Instructions (Signed)
YOU HAD AN ENDOSCOPIC PROCEDURE TODAY AT THE [facility]:   Refer to the procedure report that was given to you for any specific questions about what was found during the examination.  If the procedure report does not answer your questions, please call your gastroenterologist to clarify.  If you requested that your care partner not be given the details of your procedure findings, then the procedure report has been included in a sealed envelope for you to review at your convenience later.  YOU SHOULD EXPECT: Some feelings of bloating in the abdomen. Passage of more gas than usual.  Walking can help get rid of the air that was put into your GI tract during the procedure and reduce the bloating. If you had a lower endoscopy (such as a colonoscopy or flexible sigmoidoscopy) you may notice spotting of blood in your stool or on the toilet paper. If you underwent a bowel prep for your procedure, you may not have a normal bowel movement for a few days.  Please Note:  You might notice some irritation and congestion in your nose or some drainage.  This is from the oxygen used during your procedure.  There is no need for concern and it should clear up in a day or so.  SYMPTOMS TO REPORT IMMEDIATELY:   Following lower endoscopy (colonoscopy or flexible sigmoidoscopy):  Excessive amounts of blood in the stool  Significant tenderness or worsening of abdominal pains  Swelling of the abdomen that is new, acute  Fever of 100F or higher  For urgent or emergent issues, a gastroenterologist can be reached at any hour by calling [phone].   DIET:  We do recommend a small meal at first, but then you may proceed to your regular diet.  Drink plenty of fluids but you should avoid alcoholic beverages for 24 hours.  MEDICATIONS: Continue present medications.  Please see handouts given to you by your recovery nurse.  ACTIVITY:  You should plan to take it easy for the rest of today and you should NOT  DRIVE or use heavy machinery until tomorrow (because of the sedation medicines used during the test).    FOLLOW UP: Our staff will call the number listed on your records the next business day following your procedure to check on you and address any questions or concerns that you may have regarding the information given to you following your procedure. If we do not reach you, we will leave a message.  However, if you are feeling well and you are not experiencing any problems, there is no need to return our call.  We will assume that you have returned to your regular daily activities without incident.  If any biopsies were taken you will be contacted by phone or by letter within the next 1-3 weeks.  Please call us at [phone] if you have not heard about the biopsies in 3 weeks.   Thank you for allowing us to provide for your healthcare needs today.   SIGNATURES/CONFIDENTIALITY: You and/or your care partner have signed paperwork which will be entered into your electronic medical record.  These signatures attest to the fact that that the information above on your After Visit Summary has been reviewed and is understood.  Full responsibility of the confidentiality of this discharge information lies with you and/or your care-partner. 

## 2016-10-30 NOTE — Op Note (Signed)
[place] Patient Name: Tonya Bowers Procedure Date: 10/30/2016 8:36 AM MRN: [id number] Endoscopist: Mauri Pole , MD Age: 66 Referring MD:  Date of Birth: 09/28/1950 Gender: Female Account #: [id number] Procedure:                Colonoscopy Indications:              Screening for colorectal malignant neoplasm, Last                            colonoscopy: 2008 Medicines:                Monitored Anesthesia Care Procedure:                Pre-Anesthesia Assessment:                           - Prior to the procedure, a History and Physical                            was performed, and patient medications and                            allergies were reviewed. The patient's tolerance of                            previous anesthesia was also reviewed. The risks                            and benefits of the procedure and the sedation                            options and risks were discussed with the patient.                            All questions were answered, and informed consent                            was obtained. Prior Anticoagulants: The patient has                            taken no previous anticoagulant or antiplatelet                            agents. ASA Grade Assessment: II - A patient with                            mild systemic disease. After reviewing the risks                            and benefits, the patient was deemed in                            satisfactory condition to undergo the procedure.  After obtaining informed consent, the colonoscope                            was passed under direct vision. Throughout the                            procedure, the patient's blood pressure, pulse, and                            oxygen saturations were monitored continuously. The                            Colonoscope was introduced through the anus and                            advanced to the the cecum, identified by                             appendiceal orifice and ileocecal valve. The                            colonoscopy was performed without difficulty. The                            patient tolerated the procedure well. The quality                            of the bowel preparation was excellent. The                            ileocecal valve, appendiceal orifice, and rectum                            were photographed. Scope In: 8:44:10 AM Scope Out: 8:57:58 AM Scope Withdrawal Time: 0 hours 9 minutes 58 seconds  Total Procedure Duration: 0 hours 13 minutes 48 seconds  Findings:                 The perianal and digital rectal examinations were                            normal.                           Multiple small and large-mouthed diverticula were                            found in the sigmoid colon and descending colon.                           Non-bleeding internal hemorrhoids were found during                            retroflexion. The hemorrhoids were small. Complications:            No immediate complications. Estimated Blood  Loss:     Estimated blood loss: none. Impression:               - Diverticulosis in the sigmoid colon and in the                            descending colon.                           - Non-bleeding internal hemorrhoids.                           - No specimens collected. Recommendation:           - Patient has a contact number available for                            emergencies. The signs and symptoms of potential                            delayed complications were discussed with the                            patient. Return to normal activities tomorrow.                            Written discharge instructions were provided to the                            patient.                           - Resume previous diet.                           - Continue present medications.                           - Repeat colonoscopy in 10 years for screening                             purposes. Mauri Pole, MD 10/30/2016 9:02:59 AM This report has been signed electronically.

## 2016-10-30 NOTE — Progress Notes (Signed)
A and O x3. Report to RN. Tolerated MAC anesthesia well.

## 2016-11-02 ENCOUNTER — Telehealth: Payer: Self-pay

## 2016-11-02 NOTE — Telephone Encounter (Signed)
  Follow up Call-  Call back number 10/30/2016  Post procedure Call Back phone  # [phone]  Permission to leave phone message Yes  Some recent data might be hidden     Patient questions:  Do you have a fever, pain , or abdominal swelling? No. Pain Score  0 *  Have you tolerated food without any problems? Yes.    Have you been able to return to your normal activities? Yes.    Do you have any questions about your discharge instructions: Diet   No. Medications  No. Follow up visit  No.  Do you have questions or concerns about your Care? No.  Actions: * If pain score is 4 or above: No action needed, pain <4.

## 2016-11-26 ENCOUNTER — Encounter: Payer: 59 | Admitting: Internal Medicine

## 2016-12-09 ENCOUNTER — Encounter: Payer: 59 | Admitting: Internal Medicine

## 2016-12-18 ENCOUNTER — Encounter: Payer: Self-pay | Admitting: Internal Medicine

## 2016-12-18 ENCOUNTER — Ambulatory Visit (INDEPENDENT_AMBULATORY_CARE_PROVIDER_SITE_OTHER): Payer: 59 | Admitting: Internal Medicine

## 2016-12-18 VITALS — BP 128/84 | HR 66 | Temp 97.8°F | Ht 64.0 in | Wt 237.6 lb

## 2016-12-18 DIAGNOSIS — Z0000 Encounter for general adult medical examination without abnormal findings: Secondary | ICD-10-CM

## 2016-12-18 LAB — COMPREHENSIVE METABOLIC PANEL
ALBUMIN: 3.9 g/dL (ref 3.5–5.2)
ALK PHOS: 62 U/L (ref 39–117)
ALT: 11 U/L (ref 0–35)
AST: 11 U/L (ref 0–37)
BUN: 16 mg/dL (ref 6–23)
CALCIUM: 9.6 mg/dL (ref 8.4–10.5)
CHLORIDE: 100 meq/L (ref 96–112)
CO2: 31 mEq/L (ref 19–32)
Creatinine, Ser: 0.91 mg/dL (ref 0.40–1.20)
GFR: 79.8 mL/min (ref >60.00)
Glucose, Bld: 101 mg/dL — ABNORMAL HIGH (ref 70–99)
POTASSIUM: 3.6 meq/L (ref 3.5–5.1)
Sodium: 139 mEq/L (ref 135–145)
Total Bilirubin: 0.7 mg/dL (ref 0.2–1.2)
Total Protein: 7 g/dL (ref 6.0–8.3)

## 2016-12-18 LAB — CBC WITH DIFFERENTIAL/PLATELET
Basophils Absolute: 0.1 10*3/uL (ref 0.0–0.1)
Basophils Relative: 1.4 % (ref 0.0–3.0)
EOS PCT: 1.5 % (ref 0.0–5.0)
Eosinophils Absolute: 0.1 10*3/uL (ref 0.0–0.7)
HEMATOCRIT: 38.2 % (ref 36.0–46.0)
HEMOGLOBIN: 12.5 g/dL (ref 12.0–15.0)
LYMPHS PCT: 35.7 % (ref 12.0–46.0)
Lymphs Abs: 3.6 10*3/uL (ref 0.7–4.0)
MCHC: 32.7 g/dL (ref 30.0–36.0)
MCV: 81.9 fl (ref 78.0–100.0)
MONOS PCT: 6.9 % (ref 3.0–12.0)
Monocytes Absolute: 0.7 10*3/uL (ref 0.1–1.0)
Neutro Abs: 5.6 10*3/uL (ref 1.4–7.7)
Neutrophils Relative %: 54.5 % (ref 43.0–77.0)
Platelets: 287 10*3/uL (ref 150.0–400.0)
RBC: 4.66 Mil/uL (ref 3.87–5.11)
RDW: 13.4 % (ref 11.5–15.5)
WBC: 10.2 10*3/uL (ref 4.0–10.5)

## 2016-12-18 LAB — LIPID PANEL
CHOLESTEROL: 201 mg/dL — AB (ref 0–200)
HDL: 53.1 mg/dL (ref >39.00)
LDL CALC: 128 mg/dL — AB (ref 0–99)
NonHDL: 148.3
TRIGLYCERIDES: 104 mg/dL (ref 0.0–149.0)
Total CHOL/HDL Ratio: 4
VLDL: 20.8 mg/dL (ref 0.0–40.0)

## 2016-12-18 LAB — TSH: TSH: 1.15 u[IU]/mL (ref 0.35–4.50)

## 2016-12-18 NOTE — Progress Notes (Signed)
Subjective:    Patient ID: Tonya Bowers, female    DOB: [birth year], 66 y.o.   MRN: [id number]  HPI  66 year old patient seen today for a preventive health examination. She has essential hypertension which has been well-controlled.  Over the past year and a half.  There is been some modest weight loss. She had follow-up colonoscopy in April 2018. She is scheduled for mammogram and gynecologic follow-up in July 2018.   Past surgical history is pertinent for a cholecystectomy in 2002 she had a tonsillectomy in 1963 partial hysterectomy in 1997. This was performed due to have fibroids and endometriosis.  Social history married 3 children one grandson lifelong nonsmoker Works as a Counsellor  Family history details of her father's health unknown Mother: history of hypertension 3 brothers one status post renal transplant One sister positive for diabetes obesity and hypertension; sisters, diabetes, resolved following bariatric surgery  Past Medical History:  Diagnosis Date  . Arthritis   . Cataract   . Diverticulosis   . GERD (gastroesophageal reflux disease)   . Hyperplastic colon polyp 09/09/06     Social History   Social History  . Marital status: Married    Spouse name: N/A  . Number of children: N/A  . Years of education: N/A   Occupational History  . Not on file.   Social History Main Topics  . Smoking status: Never Smoker  . Smokeless tobacco: Never Used  . Alcohol use Yes     Comment: maybe occasionally; twice a year  . Drug use: No  . Sexual activity: Not on file   Other Topics Concern  . Not on file   Social History Narrative  . No narrative on file    Past Surgical History:  Procedure Laterality Date  . ABDOMINAL HYSTERECTOMY    . CHOLECYSTECTOMY    . COLONOSCOPY    . POLYPECTOMY    . TUBAL LIGATION      Family History  Problem Relation Age of Onset  . Hypertension Mother   . Hypertension Father   . Kidney disease Brother        kidney  transplant   . Hypertension Brother   . Colon cancer Neg Hx     Allergies  Allergen Reactions  . Lisinopril Cough  . Penicillins     Current Outpatient Prescriptions on File Prior to Visit  Medication Sig Dispense Refill  . chlorthalidone (HYGROTON) 25 MG tablet Take 1 tablet (25 mg total) by mouth daily. 90 tablet 1  . estradiol (ESTRACE) 1 MG tablet Take 1 mg by mouth daily.      Marland Kitchen OVER THE COUNTER MEDICATION     . OVER THE COUNTER MEDICATION      Current Facility-Administered Medications on File Prior to Visit  Medication Dose Route Frequency Provider Last Rate Last Dose  . 0.9 %  sodium chloride infusion  500 mL Intravenous Continuous Nandigam, Kavitha V, MD        BP 128/84 (BP Location: Left Arm, Patient Position: Sitting, Cuff Size: Large)   Pulse 66   Temp 97.8 F (36.6 C) (Oral)   Ht 5\' 4"  (1.626 m)   Wt 237 lb 9.6 oz (107.8 kg)   LMP 07/07/1995   SpO2 97%   BMI 40.78 kg/m    Review of Systems  Constitutional: Negative.   HENT: Negative for congestion, dental problem, hearing loss, rhinorrhea, sinus pressure, sore throat and tinnitus.   Eyes: Negative for pain, discharge and visual disturbance.  Respiratory: Negative for cough and shortness of breath.   Cardiovascular: Negative for chest pain, palpitations and leg swelling.  Gastrointestinal: Negative for abdominal distention, abdominal pain, blood in stool, constipation, diarrhea, nausea and vomiting.  Genitourinary: Negative for difficulty urinating, dysuria, flank pain, frequency, hematuria, pelvic pain, urgency, vaginal bleeding, vaginal discharge and vaginal pain.  Musculoskeletal: Negative for arthralgias, gait problem and joint swelling.  Skin: Negative for rash.  Neurological: Negative for dizziness, syncope, speech difficulty, weakness, numbness and headaches.  Hematological: Negative for adenopathy.  Psychiatric/Behavioral: Negative for agitation, behavioral problems and dysphoric mood. The patient is  not nervous/anxious.        Objective:   Physical Exam  Constitutional: She is oriented to person, place, and time. She appears well-developed and well-nourished.  HENT:  Head: Normocephalic and atraumatic.  Right Ear: External ear normal.  Left Ear: External ear normal.  Mouth/Throat: Oropharynx is clear and moist.  Eyes: Conjunctivae and EOM are normal.  Neck: Normal range of motion. Neck supple. No JVD present. No thyromegaly present.  Cardiovascular: Normal rate, regular rhythm, normal heart sounds and intact distal pulses.   No murmur heard. Left dorsalis pedis pulses full.  Other pedal pulses not easily palpable  Pulmonary/Chest: Effort normal and breath sounds normal. She has no wheezes. She has no rales.  Abdominal: Soft. Bowel sounds are normal. She exhibits no distension and no mass. There is no tenderness. There is no rebound and no guarding.  Musculoskeletal: Normal range of motion. She exhibits no edema or tenderness.  Neurological: She is alert and oriented to person, place, and time. She has normal reflexes. No cranial nerve deficit. She exhibits normal muscle tone. Coordination normal.  Skin: Skin is warm and dry. No rash noted.  Psychiatric: She has a normal mood and affect. Her behavior is normal.          Assessment & Plan:   Preventive health examination Hypertension, well-controlled Obesity.  Improved.  Continue efforts at weight loss History of gastro-soft, reflux disease, stable off medication Menopausal syndrome.  Follow-up OB/GYN  Continue low-salt diet efforts at weight loss and home blood pressure monitoring Recheck one year or as needed  [employer]

## 2016-12-18 NOTE — Patient Instructions (Addendum)
Limit your sodium (Salt) intake  Please check your blood pressure on a regular basis.  If it is consistently greater than 150/90, please make an office appointment.    It is important that you exercise regularly, at least 20 minutes 3 to 4 times per week.  If you develop chest pain or shortness of breath seek  medical attention.  You need to lose weight.  Consider a lower calorie diet and regular exercise.   Health Maintenance for Postmenopausal Women Menopause is a normal process in which your reproductive ability comes to an end. This process happens gradually over a span of months to years, usually between the ages of 17 and 44. Menopause is complete when you have missed 12 consecutive menstrual periods. It is important to talk with your health care provider about some of the most common conditions that affect postmenopausal women, such as heart disease, cancer, and bone loss (osteoporosis). Adopting a healthy lifestyle and getting preventive care can help to promote your health and wellness. Those actions can also lower your chances of developing some of these common conditions. What should I know about menopause? During menopause, you may experience a number of symptoms, such as:  Moderate-to-severe hot flashes.  Night sweats.  Decrease in sex drive.  Mood swings.  Headaches.  Tiredness.  Irritability.  Memory problems.  Insomnia.  Choosing to treat or not to treat menopausal changes is an individual decision that you make with your health care provider. What should I know about hormone replacement therapy and supplements? Hormone therapy products are effective for treating symptoms that are associated with menopause, such as hot flashes and night sweats. Hormone replacement carries certain risks, especially as you become older. If you are thinking about using estrogen or estrogen with progestin treatments, discuss the benefits and risks with your health care provider. What  should I know about heart disease and stroke? Heart disease, heart attack, and stroke become more likely as you age. This may be due, in part, to the hormonal changes that your body experiences during menopause. These can affect how your body processes dietary fats, triglycerides, and cholesterol. Heart attack and stroke are both medical emergencies. There are many things that you can do to help prevent heart disease and stroke:  Have your blood pressure checked at least every 1-2 years. High blood pressure causes heart disease and increases the risk of stroke.  If you are 102-61 years old, ask your health care provider if you should take aspirin to prevent a heart attack or a stroke.  Do not use any tobacco products, including cigarettes, chewing tobacco, or electronic cigarettes. If you need help quitting, ask your health care provider.  It is important to eat a healthy diet and maintain a healthy weight. ? Be sure to include plenty of vegetables, fruits, low-fat dairy products, and lean protein. ? Avoid eating foods that are high in solid fats, added sugars, or salt (sodium).  Get regular exercise. This is one of the most important things that you can do for your health. ? Try to exercise for at least 150 minutes each week. The type of exercise that you do should increase your heart rate and make you sweat. This is known as moderate-intensity exercise. ? Try to do strengthening exercises at least twice each week. Do these in addition to the moderate-intensity exercise.  Know your numbers.Ask your health care provider to check your cholesterol and your blood glucose. Continue to have your blood tested as directed by  your health care provider.  What should I know about cancer screening? There are several types of cancer. Take the following steps to reduce your risk and to catch any cancer development as early as possible. Breast Cancer  Practice breast self-awareness. ? This means  understanding how your breasts normally appear and feel. ? It also means doing regular breast self-exams. Let your health care provider know about any changes, no matter how small.  If you are 13 or older, have a clinician do a breast exam (clinical breast exam or CBE) every year. Depending on your age, family history, and medical history, it may be recommended that you also have a yearly breast X-ray (mammogram).  If you have a family history of breast cancer, talk with your health care provider about genetic screening.  If you are at high risk for breast cancer, talk with your health care provider about having an MRI and a mammogram every year.  Breast cancer (BRCA) gene test is recommended for women who have family members with BRCA-related cancers. Results of the assessment will determine the need for genetic counseling and BRCA1 and for BRCA2 testing. BRCA-related cancers include these types: ? Breast. This occurs in males or females. ? Ovarian. ? Tubal. This may also be called fallopian tube cancer. ? Cancer of the abdominal or pelvic lining (peritoneal cancer). ? Prostate. ? Pancreatic.  Cervical, Uterine, and Ovarian Cancer Your health care provider may recommend that you be screened regularly for cancer of the pelvic organs. These include your ovaries, uterus, and vagina. This screening involves a pelvic exam, which includes checking for microscopic changes to the surface of your cervix (Pap test).  For women ages 21-65, health care providers may recommend a pelvic exam and a Pap test every three years. For women ages 65-65, they may recommend the Pap test and pelvic exam, combined with testing for human papilloma virus (HPV), every five years. Some types of HPV increase your risk of cervical cancer. Testing for HPV may also be done on women of any age who have unclear Pap test results.  Other health care providers may not recommend any screening for nonpregnant women who are  considered low risk for pelvic cancer and have no symptoms. Ask your health care provider if a screening pelvic exam is right for you.  If you have had past treatment for cervical cancer or a condition that could lead to cancer, you need Pap tests and screening for cancer for at least 20 years after your treatment. If Pap tests have been discontinued for you, your risk factors (such as having a new sexual partner) need to be reassessed to determine if you should start having screenings again. Some women have medical problems that increase the chance of getting cervical cancer. In these cases, your health care provider may recommend that you have screening and Pap tests more often.  If you have a family history of uterine cancer or ovarian cancer, talk with your health care provider about genetic screening.  If you have vaginal bleeding after reaching menopause, tell your health care provider.  There are currently no reliable tests available to screen for ovarian cancer.  Lung Cancer Lung cancer screening is recommended for adults 59-11 years old who are at high risk for lung cancer because of a history of smoking. A yearly low-dose CT scan of the lungs is recommended if you:  Currently smoke.  Have a history of at least 30 pack-years of smoking and you currently smoke or  have quit within the past 15 years. A pack-year is smoking an average of one pack of cigarettes per day for one year.  Yearly screening should:  Continue until it has been 15 years since you quit.  Stop if you develop a health problem that would prevent you from having lung cancer treatment.  Colorectal Cancer  This type of cancer can be detected and can often be prevented.  Routine colorectal cancer screening usually begins at age 8 and continues through age 30.  If you have risk factors for colon cancer, your health care provider may recommend that you be screened at an earlier age.  If you have a family history of  colorectal cancer, talk with your health care provider about genetic screening.  Your health care provider may also recommend using home test kits to check for hidden blood in your stool.  A small camera at the end of a tube can be used to examine your colon directly (sigmoidoscopy or colonoscopy). This is done to check for the earliest forms of colorectal cancer.  Direct examination of the colon should be repeated every 5-10 years until age 27. However, if early forms of precancerous polyps or small growths are found or if you have a family history or genetic risk for colorectal cancer, you may need to be screened more often.  Skin Cancer  Check your skin from head to toe regularly.  Monitor any moles. Be sure to tell your health care provider: ? About any new moles or changes in moles, especially if there is a change in a mole's shape or color. ? If you have a mole that is larger than the size of a pencil eraser.  If any of your family members has a history of skin cancer, especially at a young age, talk with your health care provider about genetic screening.  Always use sunscreen. Apply sunscreen liberally and repeatedly throughout the day.  Whenever you are outside, protect yourself by wearing long sleeves, pants, a wide-brimmed hat, and sunglasses.  What should I know about osteoporosis? Osteoporosis is a condition in which bone destruction happens more quickly than new bone creation. After menopause, you may be at an increased risk for osteoporosis. To help prevent osteoporosis or the bone fractures that can happen because of osteoporosis, the following is recommended:  If you are 49-38 years old, get at least 1,000 mg of calcium and at least 600 mg of vitamin D per day.  If you are older than age 37 but younger than age 64, get at least 1,200 mg of calcium and at least 600 mg of vitamin D per day.  If you are older than age 101, get at least 1,200 mg of calcium and at least 800 mg  of vitamin D per day.  Smoking and excessive alcohol intake increase the risk of osteoporosis. Eat foods that are rich in calcium and vitamin D, and do weight-bearing exercises several times each week as directed by your health care provider. What should I know about how menopause affects my mental health? Depression may occur at any age, but it is more common as you become older. Common symptoms of depression include:  Low or sad mood.  Changes in sleep patterns.  Changes in appetite or eating patterns.  Feeling an overall lack of motivation or enjoyment of activities that you previously enjoyed.  Frequent crying spells.  Talk with your health care provider if you think that you are experiencing depression. What should I know  about immunizations? It is important that you get and maintain your immunizations. These include:  Tetanus, diphtheria, and pertussis (Tdap) booster vaccine.  Influenza every year before the flu season begins.  Pneumonia vaccine.  Shingles vaccine.  Your health care provider may also recommend other immunizations. This information is not intended to replace advice given to you by your health care provider. Make sure you discuss any questions you have with your health care provider. Document Released: 08/14/2005 Document Revised: 01/10/2016 Document Reviewed: 03/26/2015 Elsevier Interactive Patient Education  2018 [employer].

## 2017-02-17 ENCOUNTER — Other Ambulatory Visit: Payer: Self-pay | Admitting: Family Medicine

## 2017-02-17 DIAGNOSIS — Z760 Encounter for issue of repeat prescription: Secondary | ICD-10-CM

## 2017-08-19 ENCOUNTER — Other Ambulatory Visit: Payer: Self-pay | Admitting: Internal Medicine

## 2017-08-19 DIAGNOSIS — Z760 Encounter for issue of repeat prescription: Secondary | ICD-10-CM

## 2018-06-06 ENCOUNTER — Other Ambulatory Visit: Payer: Self-pay | Admitting: Internal Medicine

## 2018-06-06 DIAGNOSIS — Z760 Encounter for issue of repeat prescription: Secondary | ICD-10-CM

## 2018-06-13 ENCOUNTER — Encounter: Payer: Self-pay | Admitting: Family Medicine

## 2018-06-13 ENCOUNTER — Ambulatory Visit (INDEPENDENT_AMBULATORY_CARE_PROVIDER_SITE_OTHER): Payer: 59 | Admitting: Family Medicine

## 2018-06-13 VITALS — BP 128/76 | HR 67 | Ht 64.0 in | Wt 244.1 lb

## 2018-06-13 DIAGNOSIS — I10 Essential (primary) hypertension: Secondary | ICD-10-CM | POA: Diagnosis not present

## 2018-06-13 DIAGNOSIS — Z0000 Encounter for general adult medical examination without abnormal findings: Secondary | ICD-10-CM | POA: Diagnosis not present

## 2018-06-13 DIAGNOSIS — Z760 Encounter for issue of repeat prescription: Secondary | ICD-10-CM | POA: Diagnosis not present

## 2018-06-13 MED ORDER — CHLORTHALIDONE 25 MG PO TABS: 25.0000 mg | ORAL_TABLET | Freq: Every day | ORAL | 1 refills | Status: DC

## 2018-06-13 NOTE — Patient Instructions (Signed)
DASH Eating Plan DASH stands for "Dietary Approaches to Stop Hypertension." The DASH eating plan is a healthy eating plan that has been shown to reduce high blood pressure (hypertension). It may also reduce your risk for type 2 diabetes, heart disease, and stroke. The DASH eating plan may also help with weight loss. What are tips for following this plan? General guidelines  Avoid eating more than 2,300 mg (milligrams) of salt (sodium) a day. If you have hypertension, you may need to reduce your sodium intake to 1,500 mg a day.  Limit alcohol intake to no more than 1 drink a day for nonpregnant women and 2 drinks a day for men. One drink equals 12 oz of beer, 5 oz of wine, or 1 oz of hard liquor.  Work with your health care provider to maintain a healthy body weight or to lose weight. Ask what an ideal weight is for you.  Get at least 30 minutes of exercise that causes your heart to beat faster (aerobic exercise) most days of the week. Activities may include walking, swimming, or biking.  Work with your health care provider or diet and nutrition specialist (dietitian) to adjust your eating plan to your individual calorie needs. Reading food labels  Check food labels for the amount of sodium per serving. Choose foods with less than 5 percent of the Daily Value of sodium. Generally, foods with less than 300 mg of sodium per serving fit into this eating plan.  To find whole grains, look for the word "whole" as the first word in the ingredient list. Shopping  Buy products labeled as "low-sodium" or "no salt added."  Buy fresh foods. Avoid canned foods and premade or frozen meals. Cooking  Avoid adding salt when cooking. Use salt-free seasonings or herbs instead of table salt or sea salt. Check with your health care provider or pharmacist before using salt substitutes.  Do not fry foods. Cook foods using healthy methods such as baking, boiling, grilling, and broiling instead.  Cook with  heart-healthy oils, such as olive, canola, soybean, or sunflower oil. Meal planning   Eat a balanced diet that includes: ? 5 or more servings of fruits and vegetables each day. At each meal, try to fill half of your plate with fruits and vegetables. ? Up to 6-8 servings of whole grains each day. ? Less than 6 oz of lean meat, poultry, or fish each day. A 3-oz serving of meat is about the same size as a deck of cards. One egg equals 1 oz. ? 2 servings of low-fat dairy each day. ? A serving of nuts, seeds, or beans 5 times each week. ? Heart-healthy fats. Healthy fats called Omega-3 fatty acids are found in foods such as flaxseeds and coldwater fish, like sardines, salmon, and mackerel.  Limit how much you eat of the following: ? Canned or prepackaged foods. ? Food that is high in trans fat, such as fried foods. ? Food that is high in saturated fat, such as fatty meat. ? Sweets, desserts, sugary drinks, and other foods with added sugar. ? Full-fat dairy products.  Do not salt foods before eating.  Try to eat at least 2 vegetarian meals each week.  Eat more home-cooked food and less restaurant, buffet, and fast food.  When eating at a restaurant, ask that your food be prepared with less salt or no salt, if possible. What foods are recommended? The items listed may not be a complete list. Talk with your dietitian about what   dietary choices are best for you. Grains Whole-grain or whole-wheat bread. Whole-grain or whole-wheat pasta. Brown rice. Oatmeal. Quinoa. Bulgur. Whole-grain and low-sodium cereals. Pita bread. Low-fat, low-sodium crackers. Whole-wheat flour tortillas. Vegetables Fresh or frozen vegetables (raw, steamed, roasted, or grilled). Low-sodium or reduced-sodium tomato and vegetable juice. Low-sodium or reduced-sodium tomato sauce and tomato paste. Low-sodium or reduced-sodium canned vegetables. Fruits All fresh, dried, or frozen fruit. Canned fruit in natural juice (without  added sugar). Meat and other protein foods Skinless chicken or turkey. Ground chicken or turkey. Pork with fat trimmed off. Fish and seafood. Egg whites. Dried beans, peas, or lentils. Unsalted nuts, nut butters, and seeds. Unsalted canned beans. Lean cuts of beef with fat trimmed off. Low-sodium, lean deli meat. Dairy Low-fat (1%) or fat-free (skim) milk. Fat-free, low-fat, or reduced-fat cheeses. Nonfat, low-sodium ricotta or cottage cheese. Low-fat or nonfat yogurt. Low-fat, low-sodium cheese. Fats and oils Soft margarine without trans fats. Vegetable oil. Low-fat, reduced-fat, or light mayonnaise and salad dressings (reduced-sodium). Canola, safflower, olive, soybean, and sunflower oils. Avocado. Seasoning and other foods Herbs. Spices. Seasoning mixes without salt. Unsalted popcorn and pretzels. Fat-free sweets. What foods are not recommended? The items listed may not be a complete list. Talk with your dietitian about what dietary choices are best for you. Grains Baked goods made with fat, such as croissants, muffins, or some breads. Dry pasta or rice meal packs. Vegetables Creamed or fried vegetables. Vegetables in a cheese sauce. Regular canned vegetables (not low-sodium or reduced-sodium). Regular canned tomato sauce and paste (not low-sodium or reduced-sodium). Regular tomato and vegetable juice (not low-sodium or reduced-sodium). Pickles. Olives. Fruits Canned fruit in a light or heavy syrup. Fried fruit. Fruit in cream or butter sauce. Meat and other protein foods Fatty cuts of meat. Ribs. Fried meat. Bacon. Sausage. Bologna and other processed lunch meats. Salami. Fatback. Hotdogs. Bratwurst. Salted nuts and seeds. Canned beans with added salt. Canned or smoked fish. Whole eggs or egg yolks. Chicken or turkey with skin. Dairy Whole or 2% milk, cream, and half-and-half. Whole or full-fat cream cheese. Whole-fat or sweetened yogurt. Full-fat cheese. Nondairy creamers. Whipped toppings.  Processed cheese and cheese spreads. Fats and oils Butter. Stick margarine. Lard. Shortening. Ghee. Bacon fat. Tropical oils, such as coconut, palm kernel, or palm oil. Seasoning and other foods Salted popcorn and pretzels. Onion salt, garlic salt, seasoned salt, table salt, and sea salt. Worcestershire sauce. Tartar sauce. Barbecue sauce. Teriyaki sauce. Soy sauce, including reduced-sodium. Steak sauce. Canned and packaged gravies. Fish sauce. Oyster sauce. Cocktail sauce. Horseradish that you find on the shelf. Ketchup. Mustard. Meat flavorings and tenderizers. Bouillon cubes. Hot sauce and Tabasco sauce. Premade or packaged marinades. Premade or packaged taco seasonings. Relishes. Regular salad dressings. Where to find more information:  National Heart, Lung, and Blood Institute: www.nhlbi.nih.gov  American Heart Association: www.heart.org Summary  The DASH eating plan is a healthy eating plan that has been shown to reduce high blood pressure (hypertension). It may also reduce your risk for type 2 diabetes, heart disease, and stroke.  With the DASH eating plan, you should limit salt (sodium) intake to 2,300 mg a day. If you have hypertension, you may need to reduce your sodium intake to 1,500 mg a day.  When on the DASH eating plan, aim to eat more fresh fruits and vegetables, whole grains, lean proteins, low-fat dairy, and heart-healthy fats.  Work with your health care provider or diet and nutrition specialist (dietitian) to adjust your eating plan to your individual   calorie needs. This information is not intended to replace advice given to you by your health care provider. Make sure you discuss any questions you have with your health care provider. Document Released: 06/11/2011 Document Revised: 06/15/2016 Document Reviewed: 06/15/2016 Elsevier Interactive Patient Education  2018 [employer].  Exercising to [employer] Exercising can help you to lose weight. In order to lose weight  through exercise, you need to do vigorous-intensity exercise. You can tell that you are exercising with vigorous intensity if you are breathing very hard and fast and cannot hold a conversation while exercising. Moderate-intensity exercise helps to maintain your current weight. You can tell that you are exercising at a moderate level if you have a higher heart rate and faster breathing, but you are still able to hold a conversation. How often should I exercise? Choose an activity that you enjoy and set realistic goals. Your health care provider can help you to make an activity plan that works for you. Exercise regularly as directed by your health care provider. This may include:  Doing resistance training twice each week, such as: ? Push-ups. ? Sit-ups. ? Lifting weights. ? Using resistance bands.  Doing a given intensity of exercise for a given amount of time. Choose from these options: ? 150 minutes of moderate-intensity exercise every week. ? 75 minutes of vigorous-intensity exercise every week. ? A mix of moderate-intensity and vigorous-intensity exercise every week.  Children, pregnant women, people who are out of shape, people who are overweight, and older adults may need to consult a health care provider for individual recommendations. If you have any sort of medical condition, be sure to consult your health care provider before starting a new exercise program. What are some activities that can help me to lose weight?  Walking at a rate of at least 4.5 miles an hour.  Jogging or running at a rate of 5 miles per hour.  Biking at a rate of at least 10 miles per hour.  Lap swimming.  Roller-skating or in-line skating.  Cross-country skiing.  Vigorous competitive sports, such as football, basketball, and soccer.  Jumping rope.  Aerobic dancing. How can I be more active in my day-to-day activities?  Use the stairs instead of the elevator.  Take a walk during your lunch  break.  If you drive, park your car farther away from work or school.  If you take public transportation, get off one stop early and walk the rest of the way.  Make all of your phone calls while standing up and walking around.  Get up, stretch, and walk around every 30 minutes throughout the day. What guidelines should I follow while exercising?  Do not exercise so much that you hurt yourself, feel dizzy, or get very short of breath.  Consult your health care provider prior to starting a new exercise program.  Wear comfortable clothes and shoes with good support.  Drink plenty of water while you exercise to prevent dehydration or heat stroke. Body water is lost during exercise and must be replaced.  Work out until you breathe faster and your heart beats faster. This information is not intended to replace advice given to you by your health care provider. Make sure you discuss any questions you have with your health care provider. Document Released: 07/25/2010 Document Revised: 11/28/2015 Document Reviewed: 11/23/2013 Elsevier Interactive Patient Education  [employer].

## 2018-06-13 NOTE — Progress Notes (Signed)
Established Patient Office Visit  Subjective:  Patient ID: Tonya Bowers, female    DOB: July 14, 1950  Age: 67 y.o. MRN: [id number]  CC:  Chief Complaint  Patient presents with  . Establish Care    HPI Tonya Bowers presents for establishment of care. She is taking her medications as directed. She received her flu vaccine back in October.  Here today for follow-up of her blood pressure that is been well controlled with the chlorthalidone.  She takes it every morning and is having no side effects from taking this medicine.  It has agreed with her over the years.  She is seeing GYN for her female care.  She is on low-dose estrogen status post hysterectomy some years ago.  She tried to come off of estrogen supplement patient did not tolerate it.  She does not smoke.  She had a mammogram that was normal back in July.  She is not fasting today.  She plans on retiring next year.  She works for [employer].  Is with her husband and is also a patient of mine.  She does not smoke and rarely drinks alcohol.  Patient will return fasting for above ordered blood work.  Past Medical History:  Diagnosis Date  . Arthritis   . Cataract   . Diverticulosis   . GERD (gastroesophageal reflux disease)   . Hyperplastic colon polyp 09/09/06    Past Surgical History:  Procedure Laterality Date  . ABDOMINAL HYSTERECTOMY    . CHOLECYSTECTOMY    . COLONOSCOPY    . POLYPECTOMY    . TUBAL LIGATION      Family History  Problem Relation Age of Onset  . Hypertension Mother   . Hypertension Father   . Kidney disease Brother        kidney transplant   . Hypertension Brother   . Colon cancer Neg Hx     Social History   Socioeconomic History  . Marital status: Married    Spouse name: Not on file  . Number of children: Not on file  . Years of education: Not on file  . Highest education level: Not on file  Occupational History  . Not on file  Social Needs  . Financial resource strain: Not on file  . Food  insecurity:    Worry: Not on file    Inability: Not on file  . Transportation needs:    Medical: Not on file    Non-medical: Not on file  Tobacco Use  . Smoking status: Never Smoker  . Smokeless tobacco: Never Used  Substance and Sexual Activity  . Alcohol use: Yes    Comment: maybe occasionally; twice a year  . Drug use: No  . Sexual activity: Not on file  Lifestyle  . Physical activity:    Days per week: Not on file    Minutes per session: Not on file  . Stress: Not on file  Relationships  . Social connections:    Talks on phone: Not on file    Gets together: Not on file    Attends religious service: Not on file    Active member of club or organization: Not on file    Attends meetings of clubs or organizations: Not on file    Relationship status: Not on file  . Intimate partner violence:    Fear of current or ex partner: Not on file    Emotionally abused: Not on file    Physically abused: Not on file  Forced sexual activity: Not on file  Other Topics Concern  . Not on file  Social History Narrative  . Not on file    Outpatient Medications Prior to Visit  Medication Sig Dispense Refill  . estradiol (ESTRACE) 0.5 MG tablet Take 0.5 mg by mouth daily.  2  . OVER THE COUNTER MEDICATION     . OVER THE COUNTER MEDICATION     . chlorthalidone (HYGROTON) 25 MG tablet TAKE 1 TABLET BY MOUTH EVERY DAY 90 tablet 1  . estradiol (ESTRACE) 1 MG tablet Take 1 mg by mouth daily.      Marland Kitchen 0.9 %  sodium chloride infusion      No facility-administered medications prior to visit.     Allergies  Allergen Reactions  . Lisinopril Cough  . Penicillins     ROS Review of Systems  Constitutional: Negative for diaphoresis, fatigue, fever and unexpected weight change.  HENT: Negative.   Eyes: Negative for photophobia and visual disturbance.  Respiratory: Negative for cough, shortness of breath and wheezing.   Cardiovascular: Negative.   Gastrointestinal: Negative.   Endocrine:  Negative for polyphagia and polyuria.  Genitourinary: Negative.   Musculoskeletal: Negative for gait problem and joint swelling.  Skin: Negative for pallor.  Allergic/Immunologic: Negative for immunocompromised state.  Neurological: Negative for speech difficulty, light-headedness and numbness.  Hematological: Does not bruise/bleed easily.  Psychiatric/Behavioral: Negative.       Objective:    Physical Exam  Constitutional: She is oriented to person, place, and time. She appears well-developed and well-nourished. No distress.  HENT:  Head: Normocephalic and atraumatic.  Right Ear: External ear normal.  Left Ear: External ear normal.  Mouth/Throat: Oropharynx is clear and moist. No oropharyngeal exudate.  Eyes: Pupils are equal, round, and reactive to light. Conjunctivae are normal. Right eye exhibits no discharge. Left eye exhibits no discharge. No scleral icterus.  Neck: Neck supple. No JVD present. No tracheal deviation present. No thyromegaly present.  Cardiovascular: Normal rate, regular rhythm and normal heart sounds.  Pulmonary/Chest: Effort normal and breath sounds normal. No stridor.  Abdominal: Bowel sounds are normal.  Musculoskeletal: She exhibits no edema.  Lymphadenopathy:    She has no cervical adenopathy.  Neurological: She is alert and oriented to person, place, and time.  Skin: Skin is warm and dry. She is not diaphoretic.  Psychiatric: She has a normal mood and affect. Her behavior is normal.    BP 128/76   Pulse 67   Ht 5\' 4"  (1.626 m)   Wt 244 lb 2 oz (110.7 kg)   LMP 07/07/1995   SpO2 99%   BMI 41.90 kg/m  Wt Readings from Last 3 Encounters:  06/13/18 244 lb 2 oz (110.7 kg)  12/18/16 237 lb 9.6 oz (107.8 kg)  10/30/16 240 lb (108.9 kg)   BP Readings from Last 3 Encounters:  06/13/18 128/76  12/18/16 128/84  10/30/16 134/66   Health Maintenance Due  Topic Date Due  . MAMMOGRAM  12/12/2016  . DEXA SCAN  01/03/2017  . PNA vac Low Risk Adult (1  of 2 - PCV13) 01/03/2017    There are no preventive care reminders to display for this patient.  Lab Results  Component Value Date   TSH 1.15 12/18/2016   Lab Results  Component Value Date   WBC 10.2 12/18/2016   HGB 12.5 12/18/2016   HCT 38.2 12/18/2016   MCV 81.9 12/18/2016   PLT 287.0 12/18/2016   Lab Results  Component Value Date  NA 139 12/18/2016   K 3.6 12/18/2016   CO2 31 12/18/2016   GLUCOSE 101 (H) 12/18/2016   BUN 16 12/18/2016   CREATININE 0.91 12/18/2016   BILITOT 0.7 12/18/2016   ALKPHOS 62 12/18/2016   AST 11 12/18/2016   ALT 11 12/18/2016   PROT 7.0 12/18/2016   ALBUMIN 3.9 12/18/2016   CALCIUM 9.6 12/18/2016   GFR 79.80 12/18/2016   Lab Results  Component Value Date   CHOL 201 (H) 12/18/2016   Lab Results  Component Value Date   HDL 53.10 12/18/2016   Lab Results  Component Value Date   LDLCALC 128 (H) 12/18/2016   Lab Results  Component Value Date   TRIG 104.0 12/18/2016   Lab Results  Component Value Date   CHOLHDL 4 12/18/2016   No results found for: HGBA1C    Assessment & Plan:   Problem List Items Addressed This Visit      Cardiovascular and Mediastinum   Essential hypertension - Primary   Relevant Medications   chlorthalidone (HYGROTON) 25 MG tablet   Other Relevant Orders   CBC   Comprehensive metabolic panel   Urinalysis, Routine w reflex microscopic     Other   Healthcare maintenance   Relevant Orders   LDL cholesterol, direct   Lipid panel    Other Visit Diagnoses    Medication refill       Relevant Medications   chlorthalidone (HYGROTON) 25 MG tablet      Meds ordered this encounter  Medications  . chlorthalidone (HYGROTON) 25 MG tablet    Sig: Take 1 tablet (25 mg total) by mouth daily.    Dispense:  90 tablet    Refill:  1    Follow-up: Return in about 6 months (around 12/13/2018), or if symptoms worsen or fail to improve.   Patient will return fasting for blood work ordered blood work.  She was  given information on the DASH diet and exercising to lose weight.  Follow-up in 6 months.

## 2018-07-21 LAB — LIPID PANEL
Cholesterol: 198 (ref 0–200)
HDL: 50 (ref 35–70)
LDL Cholesterol: 125
Triglycerides: 116 (ref 40–160)

## 2018-07-21 LAB — BASIC METABOLIC PANEL: Glucose: 112

## 2018-07-22 ENCOUNTER — Encounter: Payer: Self-pay | Admitting: Family Medicine

## 2018-12-06 ENCOUNTER — Other Ambulatory Visit: Payer: Self-pay | Admitting: Family Medicine

## 2018-12-06 DIAGNOSIS — Z760 Encounter for issue of repeat prescription: Secondary | ICD-10-CM

## 2018-12-06 NOTE — Telephone Encounter (Signed)
Needs ov

## 2018-12-13 ENCOUNTER — Ambulatory Visit: Payer: 59 | Admitting: Family Medicine

## 2018-12-15 ENCOUNTER — Other Ambulatory Visit: Payer: Self-pay

## 2018-12-15 ENCOUNTER — Ambulatory Visit (INDEPENDENT_AMBULATORY_CARE_PROVIDER_SITE_OTHER): Payer: 59 | Admitting: Family Medicine

## 2018-12-15 ENCOUNTER — Ambulatory Visit (INDEPENDENT_AMBULATORY_CARE_PROVIDER_SITE_OTHER): Payer: 59

## 2018-12-15 ENCOUNTER — Encounter: Payer: Self-pay | Admitting: Family Medicine

## 2018-12-15 VITALS — BP 132/80 | HR 68 | Temp 97.8°F | Ht 64.0 in | Wt 240.4 lb

## 2018-12-15 DIAGNOSIS — Z0000 Encounter for general adult medical examination without abnormal findings: Secondary | ICD-10-CM

## 2018-12-15 DIAGNOSIS — E876 Hypokalemia: Secondary | ICD-10-CM | POA: Diagnosis not present

## 2018-12-15 DIAGNOSIS — M549 Dorsalgia, unspecified: Secondary | ICD-10-CM | POA: Diagnosis not present

## 2018-12-15 DIAGNOSIS — Z760 Encounter for issue of repeat prescription: Secondary | ICD-10-CM | POA: Diagnosis not present

## 2018-12-15 DIAGNOSIS — I10 Essential (primary) hypertension: Secondary | ICD-10-CM

## 2018-12-15 LAB — COMPREHENSIVE METABOLIC PANEL
ALT: 17 U/L (ref 0–35)
AST: 18 U/L (ref 0–37)
Albumin: 3.8 g/dL (ref 3.5–5.2)
Alkaline Phosphatase: 53 U/L (ref 39–117)
BUN: 13 mg/dL (ref 6–23)
CO2: 33 mEq/L — ABNORMAL HIGH (ref 19–32)
Calcium: 9.4 mg/dL (ref 8.4–10.5)
Chloride: 99 mEq/L (ref 96–112)
Creatinine, Ser: 0.89 mg/dL (ref 0.40–1.20)
GFR: 76.56 mL/min (ref >60.00)
Glucose, Bld: 127 mg/dL — ABNORMAL HIGH (ref 70–99)
Potassium: 3.1 mEq/L — ABNORMAL LOW (ref 3.5–5.1)
Sodium: 140 mEq/L (ref 135–145)
Total Bilirubin: 0.8 mg/dL (ref 0.2–1.2)
Total Protein: 7 g/dL (ref 6.0–8.3)

## 2018-12-15 LAB — URINALYSIS, ROUTINE W REFLEX MICROSCOPIC
Bilirubin Urine: NEGATIVE
Hgb urine dipstick: NEGATIVE
Ketones, ur: NEGATIVE
Leukocytes,Ua: NEGATIVE
Nitrite: NEGATIVE
Specific Gravity, Urine: 1.02 (ref 1.000–1.030)
Total Protein, Urine: NEGATIVE
Urine Glucose: NEGATIVE
Urobilinogen, UA: 0.2 (ref 0.0–1.0)
pH: 6 (ref 5.0–8.0)

## 2018-12-15 LAB — CBC
HCT: 38.2 % (ref 36.0–46.0)
Hemoglobin: 12.5 g/dL (ref 12.0–15.0)
MCHC: 32.8 g/dL (ref 30.0–36.0)
MCV: 82.9 fl (ref 78.0–100.0)
Platelets: 253 10*3/uL (ref 150.0–400.0)
RBC: 4.61 Mil/uL (ref 3.87–5.11)
RDW: 13.6 % (ref 11.5–15.5)
WBC: 8.5 10*3/uL (ref 4.0–10.5)

## 2018-12-15 LAB — LIPID PANEL
Cholesterol: 185 mg/dL (ref 0–200)
HDL: 49.5 mg/dL (ref >39.00)
LDL Cholesterol: 114 mg/dL — ABNORMAL HIGH (ref 0–99)
NonHDL: 135.01
Total CHOL/HDL Ratio: 4
Triglycerides: 107 mg/dL (ref 0.0–149.0)
VLDL: 21.4 mg/dL (ref 0.0–40.0)

## 2018-12-15 LAB — TSH: TSH: 2.18 u[IU]/mL (ref 0.35–4.50)

## 2018-12-15 IMAGING — DX THORACIC SPINE - 3 VIEWS
2 series · 3 of 3 positions shown · non-contrast
Comparison: None.

CLINICAL DATA: Upper back pain, no known injury, initial encounter

EXAM:
THORACIC SPINE - 3 VIEWS

[Series 1: thoracic spine ap · 2 of 2 slices shown]
[im 1/2]
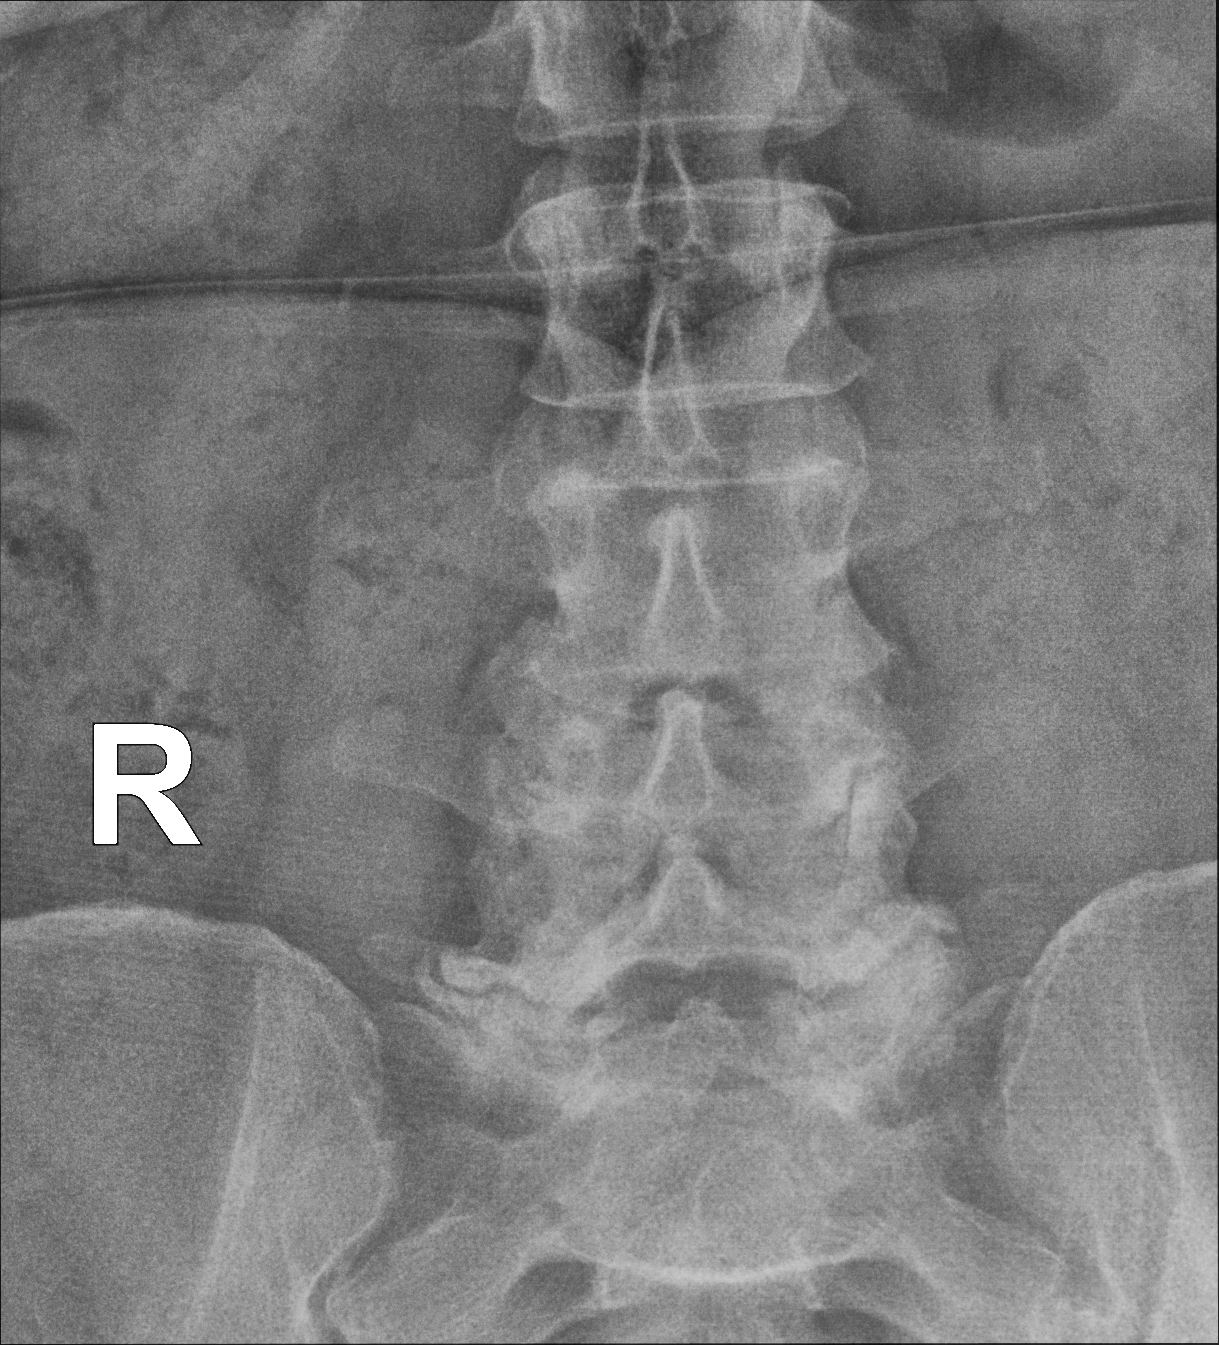
[im 2/2]
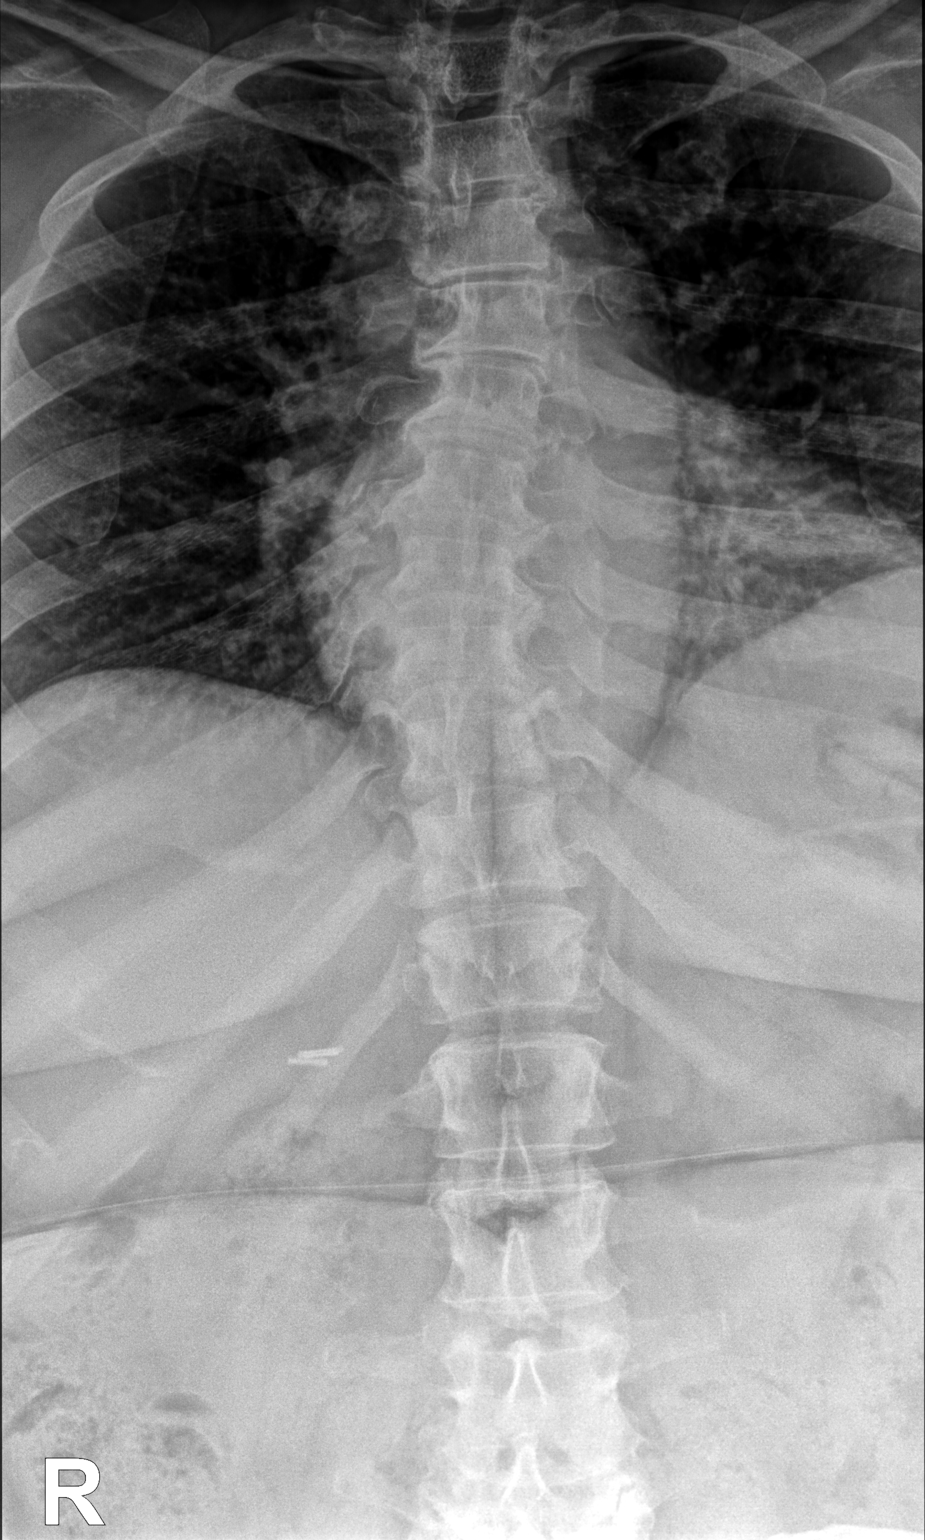

[thoracic spine lat]
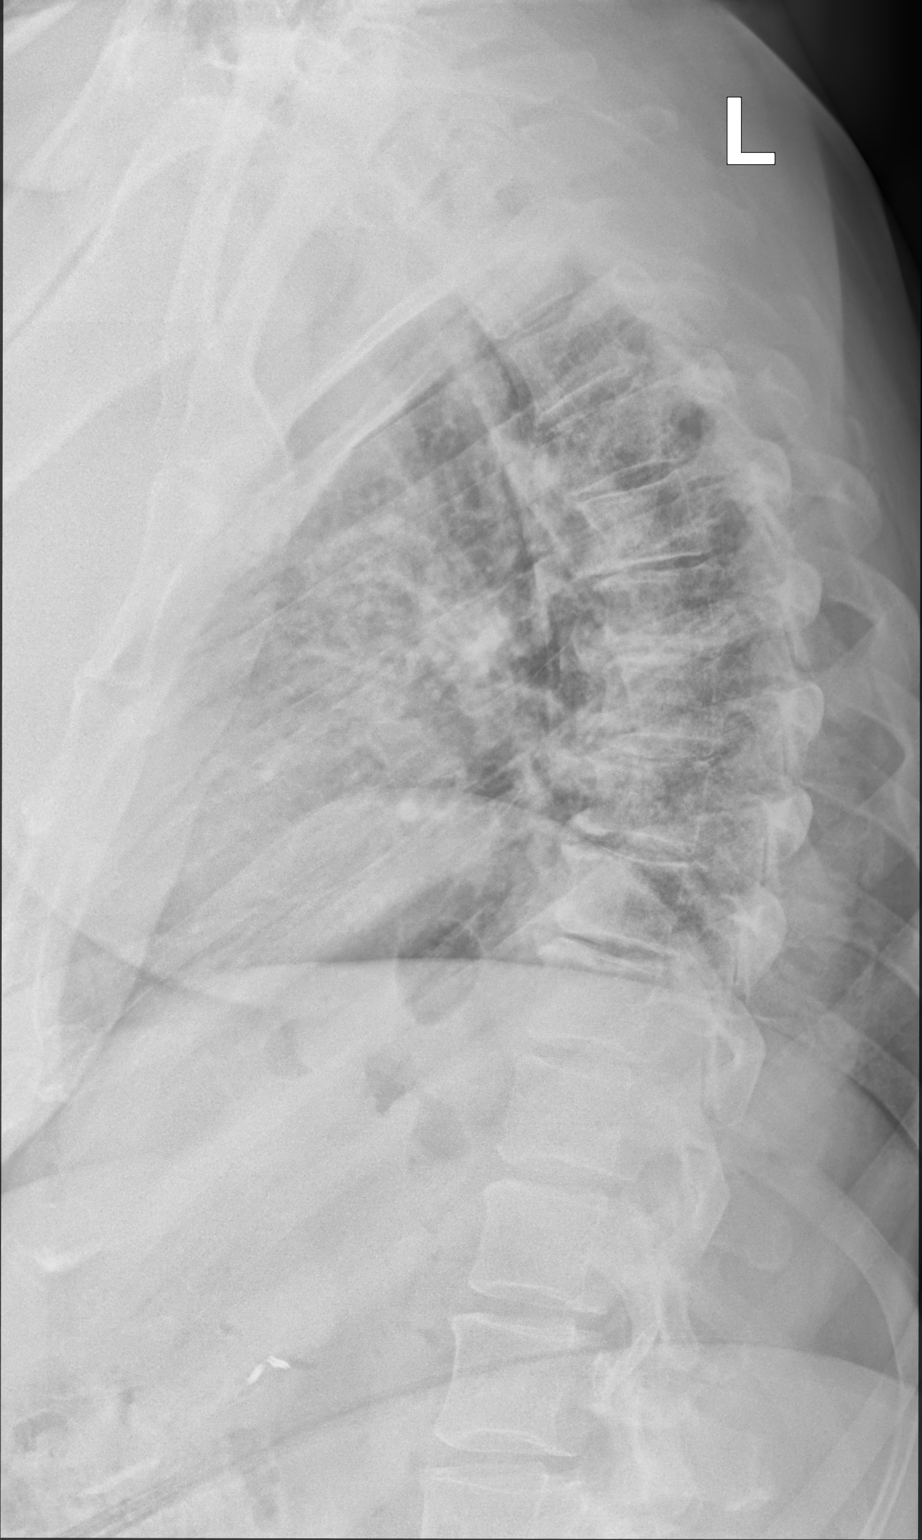

[3 of 3 positions shown; findings below may reference images not displayed]

FINDINGS: Vertebral body height is well maintained. Mild osteophytic changes
are noted. Mild S-shaped scoliosis in the midthoracic spine is
noted. No paraspinal mass is seen.
IMPRESSION: Degenerative change and mild scoliosis.

## 2018-12-15 MED ORDER — CHLORTHALIDONE 25 MG PO TABS: 25.0000 mg | ORAL_TABLET | Freq: Every day | ORAL | 1 refills | Status: DC

## 2018-12-15 MED ORDER — POTASSIUM CHLORIDE CRYS ER 20 MEQ PO TBCR: 20.0000 meq | EXTENDED_RELEASE_TABLET | Freq: Every day | ORAL | 5 refills | Status: DC

## 2018-12-15 NOTE — Addendum Note (Signed)
Addended by: Jon Billings on: 12/15/2018 02:11 PM   Modules accepted: Orders

## 2018-12-15 NOTE — Progress Notes (Addendum)
Established Patient Office Visit  Subjective:  Patient ID: Tonya Bowers, female    DOB: 11/19/1950  Age: 68 y.o. MRN: [id number]  CC:  Chief Complaint  Patient presents with  . Follow-up    HPI Tonya Bowers presents for follow-up of her blood pressure.  Blood pressures been well controlled with the chlorthalidone and she continues to tolerate the medicine well.  She has been having an ongoing issue with back pain in the chest area of her back.  Pain sometimes moves around to the left.  There is been no rash.  No recent injury.  He fell off playground slide as a child.  Scheduled for mammogram in August..  Pap and pelvic through GYN.  To date on colonoscopy.  She has had a bone density scan.  Planning on retiring in October.  Past Medical History:  Diagnosis Date  . Arthritis   . Cataract   . Diverticulosis   . GERD (gastroesophageal reflux disease)   . Hyperplastic colon polyp 09/09/06    Past Surgical History:  Procedure Laterality Date  . ABDOMINAL HYSTERECTOMY    . CHOLECYSTECTOMY    . COLONOSCOPY    . POLYPECTOMY    . TUBAL LIGATION      Family History  Problem Relation Age of Onset  . Hypertension Mother   . Hypertension Father   . Kidney disease Brother        kidney transplant   . Hypertension Brother   . Colon cancer Neg Hx     Social History   Socioeconomic History  . Marital status: Married    Spouse name: Not on file  . Number of children: Not on file  . Years of education: Not on file  . Highest education level: Not on file  Occupational History  . Not on file  Social Needs  . Financial resource strain: Not on file  . Food insecurity    Worry: Not on file    Inability: Not on file  . Transportation needs    Medical: Not on file    Non-medical: Not on file  Tobacco Use  . Smoking status: Never Smoker  . Smokeless tobacco: Never Used  Substance and Sexual Activity  . Alcohol use: Yes    Comment: maybe occasionally; twice a year  . Drug use:  No  . Sexual activity: Not on file  Lifestyle  . Physical activity    Days per week: Not on file    Minutes per session: Not on file  . Stress: Not on file  Relationships  . Social Herbalist on phone: Not on file    Gets together: Not on file    Attends religious service: Not on file    Active member of club or organization: Not on file    Attends meetings of clubs or organizations: Not on file    Relationship status: Not on file  . Intimate partner violence    Fear of current or ex partner: Not on file    Emotionally abused: Not on file    Physically abused: Not on file    Forced sexual activity: Not on file  Other Topics Concern  . Not on file  Social History Narrative  . Not on file    Outpatient Medications Prior to Visit  Medication Sig Dispense Refill  . estradiol (ESTRACE) 0.5 MG tablet Take 0.5 mg by mouth daily.  2  . OVER THE COUNTER MEDICATION     .  OVER THE COUNTER MEDICATION     . chlorthalidone (HYGROTON) 25 MG tablet TAKE 1 TABLET BY MOUTH EVERY DAY 90 tablet 0   No facility-administered medications prior to visit.     Allergies  Allergen Reactions  . Lisinopril Cough  . Penicillins     ROS Review of Systems  Constitutional: Negative for chills, diaphoresis, fatigue, fever and unexpected weight change.  HENT: Negative.   Eyes: Negative for photophobia and visual disturbance.  Respiratory: Negative.   Cardiovascular: Negative.   Gastrointestinal: Negative.   Endocrine: Negative for polyphagia and polyuria.  Genitourinary: Negative.   Musculoskeletal: Positive for arthralgias and back pain. Negative for gait problem and myalgias.  Skin: Negative for pallor and wound.  Allergic/Immunologic: Negative for immunocompromised state.  Hematological: Does not bruise/bleed easily.  Psychiatric/Behavioral: Negative.       Objective:    Physical Exam  Constitutional: She is oriented to person, place, and time. She appears well-developed and  well-nourished. No distress.  HENT:  Head: Normocephalic and atraumatic.  Right Ear: External ear normal.  Left Ear: External ear normal.  Mouth/Throat: Oropharynx is clear and moist. No oropharyngeal exudate.  Eyes: Pupils are equal, round, and reactive to light. Conjunctivae are normal. Right eye exhibits no discharge. Left eye exhibits no discharge. No scleral icterus.  Neck: No JVD present. No tracheal deviation present. No thyromegaly present.  Cardiovascular: Normal rate, regular rhythm and normal heart sounds.  Pulmonary/Chest: Effort normal and breath sounds normal. No stridor.  Abdominal: Bowel sounds are normal.  Musculoskeletal:        General: No edema.     Thoracic back: She exhibits normal range of motion, no tenderness, no bony tenderness and no spasm.  Lymphadenopathy:    She has no cervical adenopathy.  Neurological: She is alert and oriented to person, place, and time.  Skin: She is not diaphoretic.  Psychiatric: She has a normal mood and affect. Her behavior is normal.    BP 132/80   Pulse 68   Temp 97.8 F (36.6 C) (Oral)   Ht 5\' 4"  (1.626 m)   Wt 240 lb 6 oz (109 kg)   LMP 07/07/1995   SpO2 97%   BMI 41.26 kg/m  Wt Readings from Last 3 Encounters:  12/15/18 240 lb 6 oz (109 kg)  06/13/18 244 lb 2 oz (110.7 kg)  12/18/16 237 lb 9.6 oz (107.8 kg)   BP Readings from Last 3 Encounters:  12/15/18 132/80  06/13/18 128/76  12/18/16 128/84   Guideline developer:  UpToDate (see UpToDate for funding source) Date Released: June 2014  Health Maintenance Due  Topic Date Due  . MAMMOGRAM  12/12/2016  . DEXA SCAN  01/03/2017  . PNA vac Low Risk Adult (1 of 2 - PCV13) 01/03/2017    There are no preventive care reminders to display for this patient.  Lab Results  Component Value Date   TSH 2.18 12/15/2018   Lab Results  Component Value Date   WBC 8.5 12/15/2018   HGB 12.5 12/15/2018   HCT 38.2 12/15/2018   MCV 82.9 12/15/2018   PLT 253.0 12/15/2018    Lab Results  Component Value Date   NA 140 12/15/2018   K 3.1 (L) 12/15/2018   CO2 33 (H) 12/15/2018   GLUCOSE 127 (H) 12/15/2018   BUN 13 12/15/2018   CREATININE 0.89 12/15/2018   BILITOT 0.8 12/15/2018   ALKPHOS 53 12/15/2018   AST 18 12/15/2018   ALT 17 12/15/2018   PROT 7.0  12/15/2018   ALBUMIN 3.8 12/15/2018   CALCIUM 9.4 12/15/2018   GFR 76.56 12/15/2018   Lab Results  Component Value Date   CHOL 185 12/15/2018   Lab Results  Component Value Date   HDL 49.50 12/15/2018   Lab Results  Component Value Date   LDLCALC 114 (H) 12/15/2018   Lab Results  Component Value Date   TRIG 107.0 12/15/2018   Lab Results  Component Value Date   CHOLHDL 4 12/15/2018   No results found for: HGBA1C    Assessment & Plan:   Problem List Items Addressed This Visit      Cardiovascular and Mediastinum   Essential hypertension - Primary   Relevant Medications   chlorthalidone (HYGROTON) 25 MG tablet   Other Relevant Orders   CBC (Completed)   Comprehensive metabolic panel (Completed)   TSH (Completed)   Urinalysis, Routine w reflex microscopic (Completed)     Other   Healthcare maintenance   Relevant Orders   CBC (Completed)   Comprehensive metabolic panel (Completed)   Lipid panel (Completed)   Mid back pain on left side   Relevant Orders   DG Thoracic Spine W/Swimmers (Completed)   Medication refill   Relevant Medications   chlorthalidone (HYGROTON) 25 MG tablet   Hypokalemia   Relevant Medications   potassium chloride SA (K-DUR) 20 MEQ tablet      Meds ordered this encounter  Medications  . chlorthalidone (HYGROTON) 25 MG tablet    Sig: Take 1 tablet (25 mg total) by mouth daily.    Dispense:  90 tablet    Refill:  1  . potassium chloride SA (K-DUR) 20 MEQ tablet    Sig: Take 1 tablet (20 mEq total) by mouth daily.    Dispense:  30 tablet    Refill:  5    Follow-up: Return in about 6 months (around 06/16/2019), or if symptoms worsen or fail to  improve.

## 2019-04-04 ENCOUNTER — Encounter: Payer: Self-pay | Admitting: Family Medicine

## 2019-04-04 DIAGNOSIS — Z760 Encounter for issue of repeat prescription: Secondary | ICD-10-CM

## 2019-04-05 MED ORDER — CHLORTHALIDONE 25 MG PO TABS: 25.0000 mg | ORAL_TABLET | Freq: Every day | ORAL | 1 refills | Status: DC

## 2019-04-11 ENCOUNTER — Telehealth: Payer: Self-pay

## 2019-04-11 NOTE — Telephone Encounter (Signed)

## 2019-04-12 ENCOUNTER — Ambulatory Visit (INDEPENDENT_AMBULATORY_CARE_PROVIDER_SITE_OTHER): Payer: Medicare Other

## 2019-04-12 ENCOUNTER — Encounter: Payer: Self-pay | Admitting: Family Medicine

## 2019-04-12 DIAGNOSIS — Z23 Encounter for immunization: Secondary | ICD-10-CM | POA: Diagnosis not present

## 2019-04-12 NOTE — Progress Notes (Signed)
Pt came into the office today to receive her flu vaccine. Vaccine given in the right deltoid. Pt tolerated injection well. No signs/symptoms of a reaction prior to leaving the exam room. VIS given to pt.

## 2019-07-21 DIAGNOSIS — Z03818 Encounter for observation for suspected exposure to other biological agents ruled out: Secondary | ICD-10-CM | POA: Diagnosis not present

## 2019-08-03 ENCOUNTER — Ambulatory Visit: Payer: Medicare Other

## 2019-08-11 ENCOUNTER — Ambulatory Visit: Payer: Medicare Other | Attending: Internal Medicine

## 2019-08-11 DIAGNOSIS — Z23 Encounter for immunization: Secondary | ICD-10-CM

## 2019-08-11 NOTE — Progress Notes (Signed)
   Covid-19 Vaccination Clinic  Name:  Tonya Bowers    MRN: [id number] DOB: December 20, 1950  08/11/2019  [name] was observed post Covid-19 immunization for 15 minutes without incidence. She was provided with Vaccine Information Sheet and instruction to access the V-Safe system.   [name] was instructed to call 911 with any severe reactions post vaccine: Marland Kitchen Difficulty breathing  . Swelling of your face and throat  . A fast heartbeat  . A bad rash all over your body  . Dizziness and weakness    Immunizations Administered    Name Date Dose VIS Date Route   Pfizer COVID-19 Vaccine 08/11/2019  4:02 PM 0.3 mL 06/16/2019 Intramuscular   Manufacturer: [place]   Lot: [id number]   [place]: [id number]

## 2019-08-14 ENCOUNTER — Ambulatory Visit: Payer: Medicare Other

## 2019-08-20 ENCOUNTER — Other Ambulatory Visit: Payer: Self-pay | Admitting: Family Medicine

## 2019-08-20 DIAGNOSIS — Z760 Encounter for issue of repeat prescription: Secondary | ICD-10-CM

## 2019-08-23 NOTE — Telephone Encounter (Signed)
Appt. Scheduled.

## 2019-08-23 NOTE — Telephone Encounter (Signed)
Called pt to schedule an appointment for follow up on medications. No answer LMTCM

## 2019-09-05 ENCOUNTER — Ambulatory Visit: Payer: Medicare Other | Attending: Internal Medicine

## 2019-09-05 ENCOUNTER — Ambulatory Visit: Payer: Medicare Other

## 2019-09-05 DIAGNOSIS — Z23 Encounter for immunization: Secondary | ICD-10-CM | POA: Insufficient documentation

## 2019-09-05 NOTE — Progress Notes (Signed)
   Covid-19 Vaccination Clinic  Name:  ANELL MEEGAN    MRN: [id number] DOB: 1950-11-02  09/05/2019  Ms. Knuckles was observed post Covid-19 immunization for 15 minutes without incident. She was provided with Vaccine Information Sheet and instruction to access the V-Safe system.   Ms. Delucia was instructed to call 911 with any severe reactions post vaccine: Marland Kitchen Difficulty breathing  . Swelling of face and throat  . A fast heartbeat  . A bad rash all over body  . Dizziness and weakness   Immunizations Administered    Name Date Dose VIS Date Route   Pfizer COVID-19 Vaccine 09/05/2019  1:12 PM 0.3 mL 06/16/2019 Intramuscular   Manufacturer: [place]   Lot: [id number]   [place]: [id number]

## 2019-09-11 ENCOUNTER — Telehealth: Payer: Self-pay

## 2019-09-11 NOTE — Telephone Encounter (Signed)
Housing call for patient states that was seen with blood work and A 1C was 6.2. Patients next appointment with you is 10/20/19.

## 2019-09-20 ENCOUNTER — Telehealth: Payer: Self-pay | Admitting: Family Medicine

## 2019-09-20 NOTE — Telephone Encounter (Signed)
Left message for patient to schedule Annual Wellness Visit.  Please schedule with Nurse Health Advisor Victoria Britt, RN at [facility]  

## 2019-10-20 ENCOUNTER — Ambulatory Visit: Payer: Medicare Other | Admitting: Family Medicine

## 2019-11-01 ENCOUNTER — Other Ambulatory Visit: Payer: Self-pay

## 2019-11-02 ENCOUNTER — Ambulatory Visit (INDEPENDENT_AMBULATORY_CARE_PROVIDER_SITE_OTHER): Payer: Medicare Other

## 2019-11-02 ENCOUNTER — Ambulatory Visit: Payer: Medicare Other

## 2019-11-02 ENCOUNTER — Ambulatory Visit (INDEPENDENT_AMBULATORY_CARE_PROVIDER_SITE_OTHER): Payer: Medicare Other | Admitting: Family Medicine

## 2019-11-02 ENCOUNTER — Encounter: Payer: Self-pay | Admitting: Family Medicine

## 2019-11-02 VITALS — BP 140/72 | HR 62 | Temp 97.1°F | Ht 64.0 in | Wt 239.8 lb

## 2019-11-02 DIAGNOSIS — I10 Essential (primary) hypertension: Secondary | ICD-10-CM

## 2019-11-02 DIAGNOSIS — Z0000 Encounter for general adult medical examination without abnormal findings: Secondary | ICD-10-CM | POA: Diagnosis not present

## 2019-11-02 DIAGNOSIS — E876 Hypokalemia: Secondary | ICD-10-CM

## 2019-11-02 DIAGNOSIS — K760 Fatty (change of) liver, not elsewhere classified: Secondary | ICD-10-CM

## 2019-11-02 DIAGNOSIS — R109 Unspecified abdominal pain: Secondary | ICD-10-CM | POA: Diagnosis not present

## 2019-11-02 DIAGNOSIS — M549 Dorsalgia, unspecified: Secondary | ICD-10-CM

## 2019-11-02 DIAGNOSIS — K862 Cyst of pancreas: Secondary | ICD-10-CM

## 2019-11-02 DIAGNOSIS — R7309 Other abnormal glucose: Secondary | ICD-10-CM

## 2019-11-02 LAB — URINALYSIS, ROUTINE W REFLEX MICROSCOPIC
Bilirubin Urine: NEGATIVE
Hgb urine dipstick: NEGATIVE
Ketones, ur: NEGATIVE
Leukocytes,Ua: NEGATIVE
Nitrite: NEGATIVE
RBC / HPF: NONE SEEN (ref 0–?)
Specific Gravity, Urine: 1.02 (ref 1.000–1.030)
Total Protein, Urine: NEGATIVE
Urine Glucose: NEGATIVE
Urobilinogen, UA: 0.2 (ref 0.0–1.0)
pH: 7.5 (ref 5.0–8.0)

## 2019-11-02 LAB — CBC
HCT: 38 % (ref 36.0–46.0)
Hemoglobin: 12.5 g/dL (ref 12.0–15.0)
MCHC: 32.9 g/dL (ref 30.0–36.0)
MCV: 82.9 fl (ref 78.0–100.0)
Platelets: 250 10*3/uL (ref 150.0–400.0)
RBC: 4.59 Mil/uL (ref 3.87–5.11)
RDW: 13.5 % (ref 11.5–15.5)
WBC: 9.3 10*3/uL (ref 4.0–10.5)

## 2019-11-02 LAB — LIPID PANEL
Cholesterol: 189 mg/dL (ref 0–200)
HDL: 43.9 mg/dL (ref >39.00)
LDL Cholesterol: 120 mg/dL — ABNORMAL HIGH (ref 0–99)
NonHDL: 145.45
Total CHOL/HDL Ratio: 4
Triglycerides: 127 mg/dL (ref 0.0–149.0)
VLDL: 25.4 mg/dL (ref 0.0–40.0)

## 2019-11-02 LAB — COMPREHENSIVE METABOLIC PANEL
ALT: 16 U/L (ref 0–35)
AST: 16 U/L (ref 0–37)
Albumin: 3.9 g/dL (ref 3.5–5.2)
Alkaline Phosphatase: 62 U/L (ref 39–117)
BUN: 13 mg/dL (ref 6–23)
CO2: 34 mEq/L — ABNORMAL HIGH (ref 19–32)
Calcium: 9.1 mg/dL (ref 8.4–10.5)
Chloride: 100 mEq/L (ref 96–112)
Creatinine, Ser: 0.88 mg/dL (ref 0.40–1.20)
GFR: 77.36 mL/min (ref >60.00)
Glucose, Bld: 113 mg/dL — ABNORMAL HIGH (ref 70–99)
Potassium: 3.8 mEq/L (ref 3.5–5.1)
Sodium: 140 mEq/L (ref 135–145)
Total Bilirubin: 0.7 mg/dL (ref 0.2–1.2)
Total Protein: 7 g/dL (ref 6.0–8.3)

## 2019-11-02 LAB — HEMOGLOBIN A1C: Hgb A1c MFr Bld: 6 % (ref 4.6–6.5)

## 2019-11-02 IMAGING — DX DG RIBS 2V*L*
3 series · 3 of 3 positions shown · non-contrast
Comparison: None.

CLINICAL DATA: Left flank pain

EXAM:
LEFT RIBS - 2 VIEW

[chest pa]
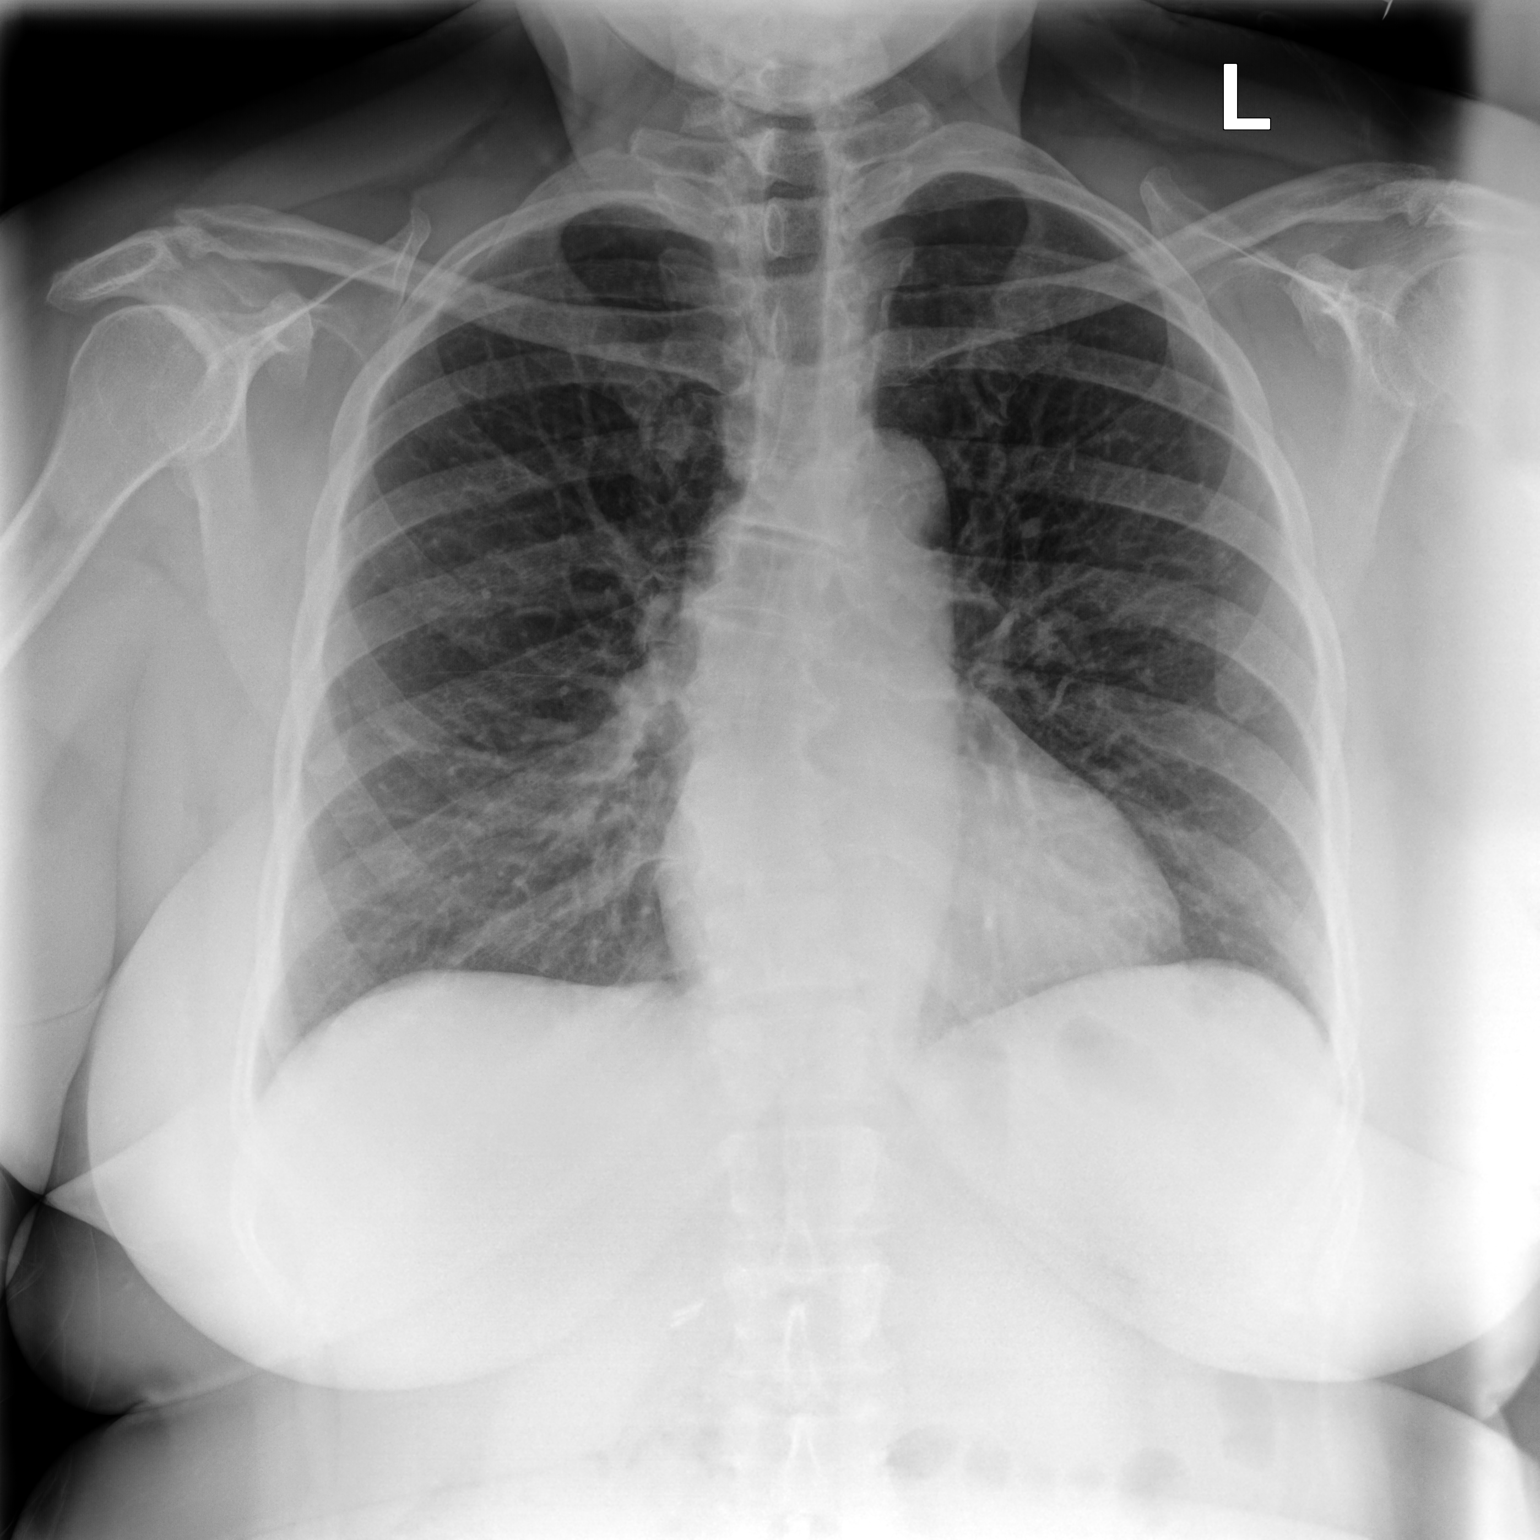

[hemithorax (ribs) mlo]
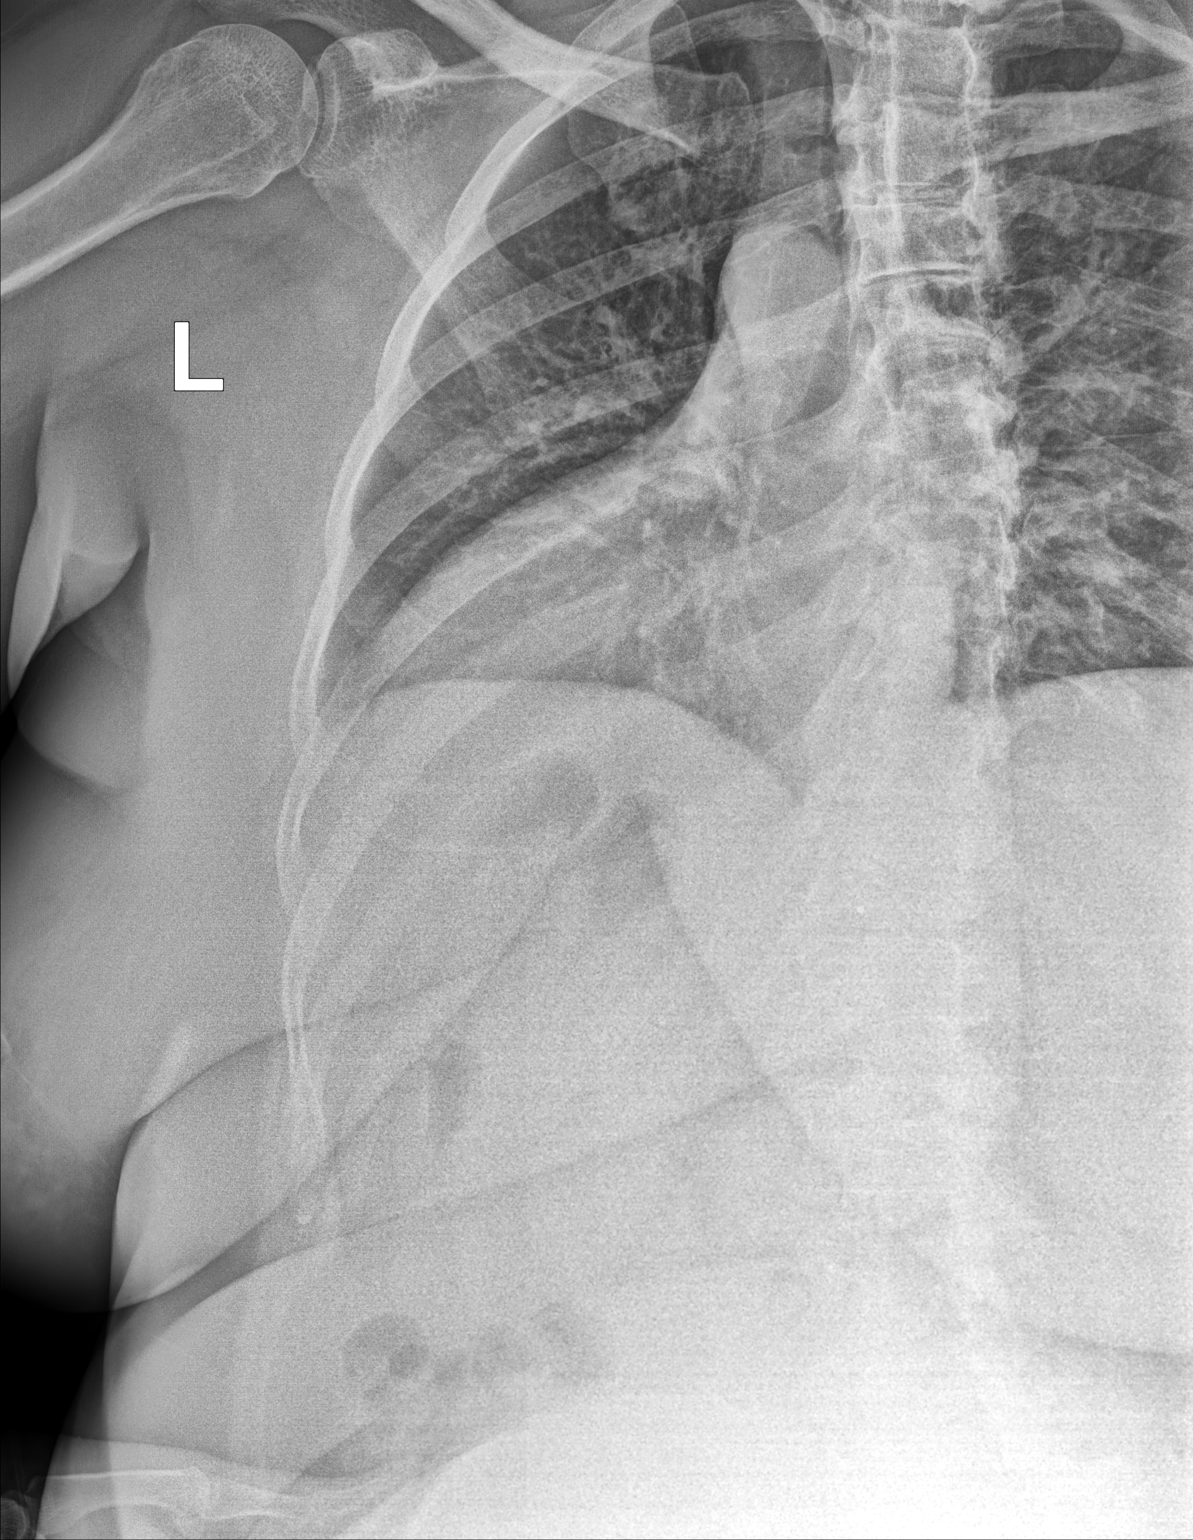

[hemithorax (ribs) pa]
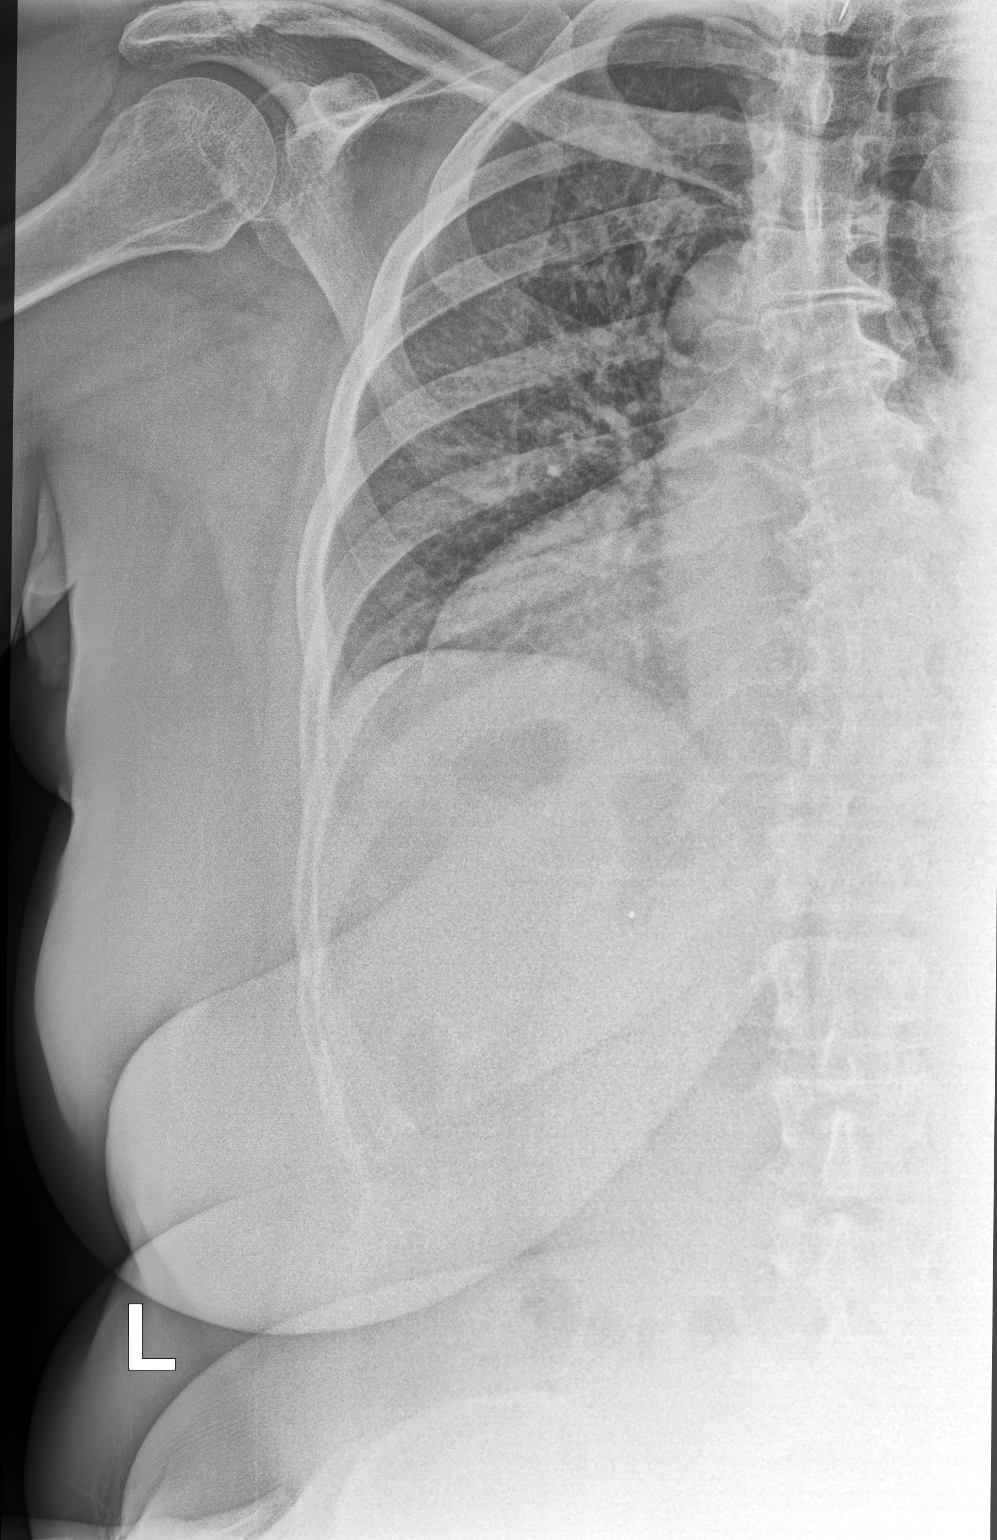

[3 of 3 positions shown; findings below may reference images not displayed]

FINDINGS: No fracture or other bone lesions are seen involving the ribs.
IMPRESSION: No displaced rib fractures or other radiographic abnormality to
explain left flank pain.

## 2019-11-02 NOTE — Progress Notes (Addendum)
Established Patient Office Visit  Subjective:  Patient ID: Tonya Bowers, female    DOB: 10/31/1950  Age: 69 y.o. MRN: [id number]  CC:  Chief Complaint  Patient presents with  . Follow-up    refill/follow up on medications. C/O left side pains that come and go.     HPI [name] presents for follow-up of her hypertension and hypokalemia.  She has been lost to follow-up.  Continues to take her chlorthalidone but decided to discontinue her potassium.  However she is increased the potassium in her diet.  Patient today did Ms. Izola Price do a PHQ-9 she did retire in October.  Staying busy around her house with her husband.  GYN care and mammogram planned for July.  She did receive the Covid vaccine.  Continues to have intermittent discomfort in her left flank area.  Distant history of a childhood playground injury to this area.  Denies changes in her bowel or bladder function.  Denies hematuria.  Past Medical History:  Diagnosis Date  . Arthritis   . Cataract   . Diverticulosis   . GERD (gastroesophageal reflux disease)   . Hyperplastic colon polyp 09/09/06    Past Surgical History:  Procedure Laterality Date  . ABDOMINAL HYSTERECTOMY    . CHOLECYSTECTOMY    . COLONOSCOPY    . POLYPECTOMY    . TUBAL LIGATION      Family History  Problem Relation Age of Onset  . Hypertension Mother   . Hypertension Father   . Kidney disease Brother        kidney transplant   . Hypertension Brother   . Colon cancer Neg Hx     Social History   Socioeconomic History  . Marital status: Married    Spouse name: Not on file  . Number of children: Not on file  . Years of education: Not on file  . Highest education level: Not on file  Occupational History  . Not on file  Tobacco Use  . Smoking status: Never Smoker  . Smokeless tobacco: Never Used  Substance and Sexual Activity  . Alcohol use: Yes    Comment: maybe occasionally; twice a year  . Drug use: No  . Sexual activity: Not on file    Other Topics Concern  . Not on file  Social History Narrative  . Not on file   Social Determinants of Health   Financial Resource Strain:   . Difficulty of Paying Living Expenses:   Food Insecurity:   . Worried About Charity fundraiser in the Last Year:   . Arboriculturist in the Last Year:   Transportation Needs:   . Film/video editor (Medical):   Marland Kitchen Lack of Transportation (Non-Medical):   Physical Activity:   . Days of Exercise per Week:   . Minutes of Exercise per Session:   Stress:   . Feeling of Stress :   Social Connections:   . Frequency of Communication with Friends and Family:   . Frequency of Social Gatherings with Friends and Family:   . Attends Religious Services:   . Active Member of Clubs or Organizations:   . Attends Archivist Meetings:   Marland Kitchen Marital Status:   Intimate Partner Violence:   . Fear of Current or Ex-Partner:   . Emotionally Abused:   Marland Kitchen Physically Abused:   . Sexually Abused:     Outpatient Medications Prior to Visit  Medication Sig Dispense Refill  . chlorthalidone (HYGROTON)  25 MG tablet TAKE 1 TABLET BY MOUTH  DAILY 90 tablet 0  . estradiol (ESTRACE) 0.5 MG tablet Take 0.5 mg by mouth daily.  2  . OVER THE COUNTER MEDICATION     . OVER THE COUNTER MEDICATION     . potassium chloride SA (K-DUR) 20 MEQ tablet Take 1 tablet (20 mEq total) by mouth daily. (Patient not taking: Reported on 11/02/2019) 30 tablet 5   No facility-administered medications prior to visit.    Allergies  Allergen Reactions  . Lisinopril Cough  . Penicillins     ROS Review of Systems  Constitutional: Negative.   HENT: Negative.   Eyes: Negative for photophobia and visual disturbance.  Respiratory: Negative.   Cardiovascular: Negative.   Gastrointestinal: Negative.   Endocrine: Negative for polyphagia and polyuria.  Genitourinary: Positive for flank pain. Negative for difficulty urinating, hematuria and urgency.  Musculoskeletal: Negative for  gait problem and joint swelling.  Skin: Negative for pallor and rash.  Allergic/Immunologic: Negative for immunocompromised state.  Neurological: Negative for light-headedness and headaches.  Hematological: Does not bruise/bleed easily.  Psychiatric/Behavioral: Negative.      Depression screen [facility] 2/9 11/02/2019 03/25/2015  Decreased Interest 0 0  Down, Depressed, Hopeless 0 0  PHQ - 2 Score 0 0      Objective:    Physical Exam  Constitutional: She is oriented to person, place, and time. She appears well-developed and well-nourished. No distress.  HENT:  Head: Normocephalic and atraumatic.  Right Ear: External ear normal.  Left Ear: External ear normal.  Eyes: Conjunctivae are normal. Right eye exhibits no discharge. Left eye exhibits no discharge. No scleral icterus.  Neck: No JVD present. No tracheal deviation present. No thyromegaly present.  Cardiovascular: Normal rate, regular rhythm and normal heart sounds.  Pulmonary/Chest: Effort normal and breath sounds normal. No stridor.  Abdominal: Soft. Bowel sounds are normal. She exhibits no distension. There is no abdominal tenderness. There is no rebound and no guarding.  Musculoskeletal:        General: No edema.  Lymphadenopathy:    She has no cervical adenopathy.  Neurological: She is alert and oriented to person, place, and time.  Skin: Skin is warm and dry. She is not diaphoretic.  Psychiatric: She has a normal mood and affect. Her behavior is normal.    BP 140/72   Pulse 62   Temp (!) 97.1 F (36.2 C) (Tympanic)   Ht 5\' 4"  (1.626 m)   Wt 239 lb 12.8 oz (108.8 kg)   LMP 07/07/1995   SpO2 96%   BMI 41.16 kg/m  Wt Readings from Last 3 Encounters:  11/02/19 239 lb 12.8 oz (108.8 kg)  12/15/18 240 lb 6 oz (109 kg)  06/13/18 244 lb 2 oz (110.7 kg)     Health Maintenance Due  Topic Date Due  . MAMMOGRAM  12/12/2016  . DEXA SCAN  Never done  . PNA vac Low Risk Adult (1 of 2 - PCV13) Never done    There are no  preventive care reminders to display for this patient.  Lab Results  Component Value Date   TSH 2.18 12/15/2018   Lab Results  Component Value Date   WBC 8.5 12/15/2018   HGB 12.5 12/15/2018   HCT 38.2 12/15/2018   MCV 82.9 12/15/2018   PLT 253.0 12/15/2018   Lab Results  Component Value Date   NA 140 12/15/2018   K 3.1 (L) 12/15/2018   CO2 33 (H) 12/15/2018   GLUCOSE  127 (H) 12/15/2018   BUN 13 12/15/2018   CREATININE 0.89 12/15/2018   BILITOT 0.8 12/15/2018   ALKPHOS 53 12/15/2018   AST 18 12/15/2018   ALT 17 12/15/2018   PROT 7.0 12/15/2018   ALBUMIN 3.8 12/15/2018   CALCIUM 9.4 12/15/2018   GFR 76.56 12/15/2018   Lab Results  Component Value Date   CHOL 185 12/15/2018   Lab Results  Component Value Date   HDL 49.50 12/15/2018   Lab Results  Component Value Date   LDLCALC 114 (H) 12/15/2018   Lab Results  Component Value Date   TRIG 107.0 12/15/2018   Lab Results  Component Value Date   CHOLHDL 4 12/15/2018   No results found for: HGBA1C    Assessment & Plan:   Problem List Items Addressed This Visit      Cardiovascular and Mediastinum   Essential hypertension - Primary   Relevant Orders   CBC   Comprehensive metabolic panel   Urinalysis, Routine w reflex microscopic     Other   Healthcare maintenance   Relevant Orders   Comprehensive metabolic panel   Lipid panel   Urinalysis, Routine w reflex microscopic   Mid back pain on left side   Relevant Orders   DG Ribs Unilateral Right   US Abdomen Complete   DG Ribs Unilateral Left   Hypokalemia   Relevant Orders   Comprehensive metabolic panel    Other Visit Diagnoses    Elevated glucose       Relevant Orders   Hemoglobin A1c      No orders of the defined types were placed in this encounter.   Follow-up: Return in about 3 months (around 02/01/2020).   Patient was given information on health maintenance and disease prevention as well as hypokalemia.  We may need to use a  different agent for her blood pressure control.  Advised her to exercise and try to lose some weight.  Also advised for more frequent follow-up.  Will discuss steatosis and pancreatic cysts next visit. Libby Maw, MD

## 2019-11-02 NOTE — Patient Instructions (Signed)
Health Maintenance After Age 68 After age 39, you are at a higher risk for certain long-term diseases and infections as well as injuries from falls. Falls are a major cause of broken bones and head injuries in people who are older than age 66. Getting regular preventive care can help to keep you healthy and well. Preventive care includes getting regular testing and making lifestyle changes as recommended by your health care provider. Talk with your health care provider about:  Which screenings and tests you should have. A screening is a test that checks for a disease when you have no symptoms.  A diet and exercise plan that is right for you. What should I know about screenings and tests to prevent falls? Screening and testing are the best ways to find a health problem early. Early diagnosis and treatment give you the best chance of managing medical conditions that are common after age 8. Certain conditions and lifestyle choices may make you more likely to have a fall. Your health care provider may recommend:  Regular vision checks. Poor vision and conditions such as cataracts can make you more likely to have a fall. If you wear glasses, make sure to get your prescription updated if your vision changes.  Medicine review. Work with your health care provider to regularly review all of the medicines you are taking, including over-the-counter medicines. Ask your health care provider about any side effects that may make you more likely to have a fall. Tell your health care provider if any medicines that you take make you feel dizzy or sleepy.  Osteoporosis screening. Osteoporosis is a condition that causes the bones to get weaker. This can make the bones weak and cause them to break more easily.  Blood pressure screening. Blood pressure changes and medicines to control blood pressure can make you feel dizzy.  Strength and balance checks. Your health care provider may recommend certain tests to check your  strength and balance while standing, walking, or changing positions.  Foot health exam. Foot pain and numbness, as well as not wearing proper footwear, can make you more likely to have a fall.  Depression screening. You may be more likely to have a fall if you have a fear of falling, feel emotionally low, or feel unable to do activities that you used to do.  Alcohol use screening. Using too much alcohol can affect your balance and may make you more likely to have a fall. What actions can I take to lower my risk of falls? General instructions  Talk with your health care provider about your risks for falling. Tell your health care provider if: ? You fall. Be sure to tell your health care provider about all falls, even ones that seem minor. ? You feel dizzy, sleepy, or off-balance.  Take over-the-counter and prescription medicines only as told by your health care provider. These include any supplements.  Eat a healthy diet and maintain a healthy weight. A healthy diet includes low-fat dairy products, low-fat (lean) meats, and fiber from whole grains, beans, and lots of fruits and vegetables. Home safety  Remove any tripping hazards, such as rugs, cords, and clutter.  Install safety equipment such as grab bars in bathrooms and safety rails on stairs.  Keep rooms and walkways well-lit. Activity   Follow a regular exercise program to stay fit. This will help you maintain your balance. Ask your health care provider what types of exercise are appropriate for you.  If you need a cane or  walker, use it as recommended by your health care provider.  Wear supportive shoes that have nonskid soles. Lifestyle  Do not drink alcohol if your health care provider tells you not to drink.  If you drink alcohol, limit how much you have: ? 0-1 drink a day for women. ? 0-2 drinks a day for men.  Be aware of how much alcohol is in your drink. In the [place] one drink equals one typical bottle of beer (12  oz), one-half glass of wine (5 oz), or one shot of hard liquor (1 oz).  Do not use any products that contain nicotine or tobacco, such as cigarettes and e-cigarettes. If you need help quitting, ask your health care provider. Summary  Having a healthy lifestyle and getting preventive care can help to protect your health and wellness after age 36.  Screening and testing are the best way to find a health problem early and help you avoid having a fall. Early diagnosis and treatment give you the best chance for managing medical conditions that are more common for people who are older than age 30.  Falls are a major cause of broken bones and head injuries in people who are older than age 50. Take precautions to prevent a fall at home.  Work with your health care provider to learn what changes you can make to improve your health and wellness and to prevent falls. This information is not intended to replace advice given to you by your health care provider. Make sure you discuss any questions you have with your health care provider. Document Revised: 10/13/2018 Document Reviewed: 05/05/2017 Elsevier Patient Education  2020 [place] 65 Years and Older, Female Preventive care refers to lifestyle choices and visits with your health care provider that can promote health and wellness. This includes:  A yearly physical exam. This is also called an annual well check.  Regular dental and eye exams.  Immunizations.  Screening for certain conditions.  Healthy lifestyle choices, such as diet and exercise. What can I expect for my preventive care visit? Physical exam Your health care provider will check:  Height and weight. These may be used to calculate body mass index (BMI), which is a measurement that tells if you are at a healthy weight.  Heart rate and blood pressure.  Your skin for abnormal spots. Counseling Your health care provider may ask you questions  about:  Alcohol, tobacco, and drug use.  Emotional well-being.  Home and relationship well-being.  Sexual activity.  Eating habits.  History of falls.  Memory and ability to understand (cognition).  Work and work Statistician.  Pregnancy and menstrual history. What immunizations do I need?  Influenza (flu) vaccine  This is recommended every year. Tetanus, diphtheria, and pertussis (Tdap) vaccine  You may need a Td booster every 10 years. Varicella (chickenpox) vaccine  You may need this vaccine if you have not already been vaccinated. Zoster (shingles) vaccine  You may need this after age 9. Pneumococcal conjugate (PCV13) vaccine  One dose is recommended after age 30. Pneumococcal polysaccharide (PPSV23) vaccine  One dose is recommended after age 63. Measles, mumps, and rubella (MMR) vaccine  You may need at least one dose of MMR if you were born in 1957 or later. You may also need a second dose. Meningococcal conjugate (MenACWY) vaccine  You may need this if you have certain conditions. Hepatitis A vaccine  You may need this if you have certain conditions or if you travel  or work in places where you may be exposed to hepatitis A. Hepatitis B vaccine  You may need this if you have certain conditions or if you travel or work in places where you may be exposed to hepatitis B. Haemophilus influenzae type b (Hib) vaccine  You may need this if you have certain conditions. You may receive vaccines as individual doses or as more than one vaccine together in one shot (combination vaccines). Talk with your health care provider about the risks and benefits of combination vaccines. What tests do I need? Blood tests  Lipid and cholesterol levels. These may be checked every 5 years, or more frequently depending on your overall health.  Hepatitis C test.  Hepatitis B test. Screening  Lung cancer screening. You may have this screening every year starting at age 72 if  you have a 30-pack-year history of smoking and currently smoke or have quit within the past 15 years.  Colorectal cancer screening. All adults should have this screening starting at age 86 and continuing until age 79. Your health care provider may recommend screening at age 77 if you are at increased risk. You will have tests every 1-10 years, depending on your results and the type of screening test.  Diabetes screening. This is done by checking your blood sugar (glucose) after you have not eaten for a while (fasting). You may have this done every 1-3 years.  Mammogram. This may be done every 1-2 years. Talk with your health care provider about how often you should have regular mammograms.  BRCA-related cancer screening. This may be done if you have a family history of breast, ovarian, tubal, or peritoneal cancers. Other tests  Sexually transmitted disease (STD) testing.  Bone density scan. This is done to screen for osteoporosis. You may have this done starting at age 63. Follow these instructions at home: Eating and drinking  Eat a diet that includes fresh fruits and vegetables, whole grains, lean protein, and low-fat dairy products. Limit your intake of foods with high amounts of sugar, saturated fats, and salt.  Take vitamin and mineral supplements as recommended by your health care provider.  Do not drink alcohol if your health care provider tells you not to drink.  If you drink alcohol: ? Limit how much you have to 0-1 drink a day. ? Be aware of how much alcohol is in your drink. In the [place] one drink equals one 12 oz bottle of beer (355 mL), one 5 oz glass of wine (148 mL), or one 1 oz glass of hard liquor (44 mL). Lifestyle  Take daily care of your teeth and gums.  Stay active. Exercise for at least 30 minutes on 5 or more days each week.  Do not use any products that contain nicotine or tobacco, such as cigarettes, e-cigarettes, and chewing tobacco. If you need help  quitting, ask your health care provider.  If you are sexually active, practice safe sex. Use a condom or other form of protection in order to prevent STIs (sexually transmitted infections).  Talk with your health care provider about taking a low-dose aspirin or statin. What's next?  Go to your health care provider once a year for a well check visit.  Ask your health care provider how often you should have your eyes and teeth checked.  Stay up to date on all vaccines. This information is not intended to replace advice given to you by your health care provider. Make sure you discuss any questions you have  with your health care provider. Document Revised: 06/16/2018 Document Reviewed: 06/16/2018 Elsevier Patient Education  [place].  Hypokalemia Hypokalemia means that the amount of potassium in the blood is lower than normal. Potassium is a chemical (electrolyte) that helps regulate the amount of fluid in the body. It also stimulates muscle tightening (contraction) and helps nerves work properly. Normally, most of the body's potassium is inside cells, and only a very small amount is in the blood. Because the amount in the blood is so small, minor changes to potassium levels in the blood can be life-threatening. What are the causes? This condition may be caused by:  Antibiotic medicine.  Diarrhea or vomiting. Taking too much of a medicine that helps you have a bowel movement (laxative) can cause diarrhea and lead to hypokalemia.  Chronic kidney disease (CKD).  Medicines that help the body get rid of excess fluid (diuretics).  Eating disorders, such as bulimia.  Low magnesium levels in the body.  Sweating a lot. What are the signs or symptoms? Symptoms of this condition include:  Weakness.  Constipation.  Fatigue.  Muscle cramps.  Mental confusion.  Skipped heartbeats or irregular heartbeat (palpitations).  Tingling or numbness. How is this diagnosed? This  condition is diagnosed with a blood test. How is this treated? This condition may be treated by:  Taking potassium supplements by mouth.  Adjusting the medicines that you take.  Eating more foods that contain a lot of potassium. If your potassium level is very low, you may need to get potassium through an IV and be monitored in the hospital. Follow these instructions at home:   Take over-the-counter and prescription medicines only as told by your health care provider. This includes vitamins and supplements.  Eat a healthy diet. A healthy diet includes fresh fruits and vegetables, whole grains, healthy fats, and lean proteins.  If instructed, eat more foods that contain a lot of potassium. This includes: ? Nuts, such as peanuts and pistachios. ? Seeds, such as sunflower seeds and pumpkin seeds. ? Peas, lentils, and lima beans. ? Whole grain and bran cereals and breads. ? Fresh fruits and vegetables, such as apricots, avocado, bananas, cantaloupe, kiwi, oranges, tomatoes, asparagus, and potatoes. ? Orange juice. ? Tomato juice. ? Red meats. ? Yogurt.  Keep all follow-up visits as told by your health care provider. This is important. Contact a health care provider if you:  Have weakness that gets worse.  Feel your heart pounding or racing.  Vomit.  Have diarrhea.  Have diabetes (diabetes mellitus) and you have trouble keeping your blood sugar (glucose) in your target range. Get help right away if you:  Have chest pain.  Have shortness of breath.  Have vomiting or diarrhea that lasts for more than 2 days.  Faint. Summary  Hypokalemia means that the amount of potassium in the blood is lower than normal.  This condition is diagnosed with a blood test.  Hypokalemia may be treated by taking potassium supplements, adjusting the medicines that you take, or eating more foods that are high in potassium.  If your potassium level is very low, you may need to get potassium  through an IV and be monitored in the hospital. This information is not intended to replace advice given to you by your health care provider. Make sure you discuss any questions you have with your health care provider. Document Revised: 02/02/2018 Document Reviewed: 02/02/2018 Elsevier Patient Education  [place].  Managing Your Hypertension Hypertension is commonly called  high blood pressure. This is when the force of your blood pressing against the walls of your arteries is too strong. Arteries are blood vessels that carry blood from your heart throughout your body. Hypertension forces the heart to work harder to pump blood, and may cause the arteries to become narrow or stiff. Having untreated or uncontrolled hypertension can cause heart attack, stroke, kidney disease, and other problems. What are blood pressure readings? A blood pressure reading consists of a higher number over a lower number. Ideally, your blood pressure should be below 120/80. The first ("top") number is called the systolic pressure. It is a measure of the pressure in your arteries as your heart beats. The second ("bottom") number is called the diastolic pressure. It is a measure of the pressure in your arteries as the heart relaxes. What does my blood pressure reading mean? Blood pressure is classified into four stages. Based on your blood pressure reading, your health care provider may use the following stages to determine what type of treatment you need, if any. Systolic pressure and diastolic pressure are measured in a unit called mm Hg. Normal  Systolic pressure: below 893.  Diastolic pressure: below 80. Elevated  Systolic pressure: 734-287.  Diastolic pressure: below 80. Hypertension stage 1  Systolic pressure: 681-157.  Diastolic pressure: 26-20. Hypertension stage 2  Systolic pressure: 355 or above.  Diastolic pressure: 90 or above. What health risks are associated with hypertension? Managing  your hypertension is an important responsibility. Uncontrolled hypertension can lead to:  A heart attack.  A stroke.  A weakened blood vessel (aneurysm).  Heart failure.  Kidney damage.  Eye damage.  Metabolic syndrome.  Memory and concentration problems. What changes can I make to manage my hypertension? Hypertension can be managed by making lifestyle changes and possibly by taking medicines. Your health care provider will help you make a plan to bring your blood pressure within a normal range. Eating and drinking   Eat a diet that is high in fiber and potassium, and low in salt (sodium), added sugar, and fat. An example eating plan is called the DASH (Dietary Approaches to Stop Hypertension) diet. To eat this way: ? Eat plenty of fresh fruits and vegetables. Try to fill half of your plate at each meal with fruits and vegetables. ? Eat whole grains, such as whole wheat pasta, brown rice, or whole grain bread. Fill about one quarter of your plate with whole grains. ? Eat low-fat diary products. ? Avoid fatty cuts of meat, processed or cured meats, and poultry with skin. Fill about one quarter of your plate with lean proteins such as fish, chicken without skin, beans, eggs, and tofu. ? Avoid premade and processed foods. These tend to be higher in sodium, added sugar, and fat.  Reduce your daily sodium intake. Most people with hypertension should eat less than 1,500 mg of sodium a day.  Limit alcohol intake to no more than 1 drink a day for nonpregnant women and 2 drinks a day for men. One drink equals 12 oz of beer, 5 oz of wine, or 1 oz of hard liquor. Lifestyle  Work with your health care provider to maintain a healthy body weight, or to lose weight. Ask what an ideal weight is for you.  Get at least 30 minutes of exercise that causes your heart to beat faster (aerobic exercise) most days of the week. Activities may include walking, swimming, or biking.  Include exercise to  strengthen your muscles (resistance exercise),  such as weight lifting, as part of your weekly exercise routine. Try to do these types of exercises for 30 minutes at least 3 days a week.  Do not use any products that contain nicotine or tobacco, such as cigarettes and e-cigarettes. If you need help quitting, ask your health care provider.  Control any long-term (chronic) conditions you have, such as high cholesterol or diabetes. Monitoring  Monitor your blood pressure at home as told by your health care provider. Your personal target blood pressure may vary depending on your medical conditions, your age, and other factors.  Have your blood pressure checked regularly, as often as told by your health care provider. Working with your health care provider  Review all the medicines you take with your health care provider because there may be side effects or interactions.  Talk with your health care provider about your diet, exercise habits, and other lifestyle factors that may be contributing to hypertension.  Visit your health care provider regularly. Your health care provider can help you create and adjust your plan for managing hypertension. Will I need medicine to control my blood pressure? Your health care provider may prescribe medicine if lifestyle changes are not enough to get your blood pressure under control, and if:  Your systolic blood pressure is 130 or higher.  Your diastolic blood pressure is 80 or higher. Take medicines only as told by your health care provider. Follow the directions carefully. Blood pressure medicines must be taken as prescribed. The medicine does not work as well when you skip doses. Skipping doses also puts you at risk for problems. Contact a health care provider if:  You think you are having a reaction to medicines you have taken.  You have repeated (recurrent) headaches.  You feel dizzy.  You have swelling in your ankles.  You have trouble with your  vision. Get help right away if:  You develop a severe headache or confusion.  You have unusual weakness or numbness, or you feel faint.  You have severe pain in your chest or abdomen.  You vomit repeatedly.  You have trouble breathing. Summary  Hypertension is when the force of blood pumping through your arteries is too strong. If this condition is not controlled, it may put you at risk for serious complications.  Your personal target blood pressure may vary depending on your medical conditions, your age, and other factors. For most people, a normal blood pressure is less than 120/80.  Hypertension is managed by lifestyle changes, medicines, or both. Lifestyle changes include weight loss, eating a healthy, low-sodium diet, exercising more, and limiting alcohol. This information is not intended to replace advice given to you by your health care provider. Make sure you discuss any questions you have with your health care provider. Document Revised: 10/14/2018 Document Reviewed: 05/20/2016 Elsevier Patient Education  [place].

## 2019-11-14 ENCOUNTER — Ambulatory Visit
Admission: RE | Admit: 2019-11-14 | Discharge: 2019-11-14 | Disposition: A | Discharge status: Home / Self Care | Payer: Medicare Other | Source: Ambulatory Visit | Attending: Family Medicine | Admitting: Family Medicine

## 2019-11-14 DIAGNOSIS — R109 Unspecified abdominal pain: Secondary | ICD-10-CM | POA: Diagnosis not present

## 2019-11-14 DIAGNOSIS — M549 Dorsalgia, unspecified: Secondary | ICD-10-CM

## 2019-11-14 IMAGING — US US ABDOMEN COMPLETE
1 series · 13 of 25 positions shown · non-contrast
Comparison: Ultrasound dated [DATE].  CT dated [DATE].

CLINICAL DATA: Ongoing left flank pain.

EXAM:
ABDOMEN ULTRASOUND COMPLETE

[Series 1: us abdomen complete · 0.22mm/px · 13 of 96 slices shown]
[im 1/96]
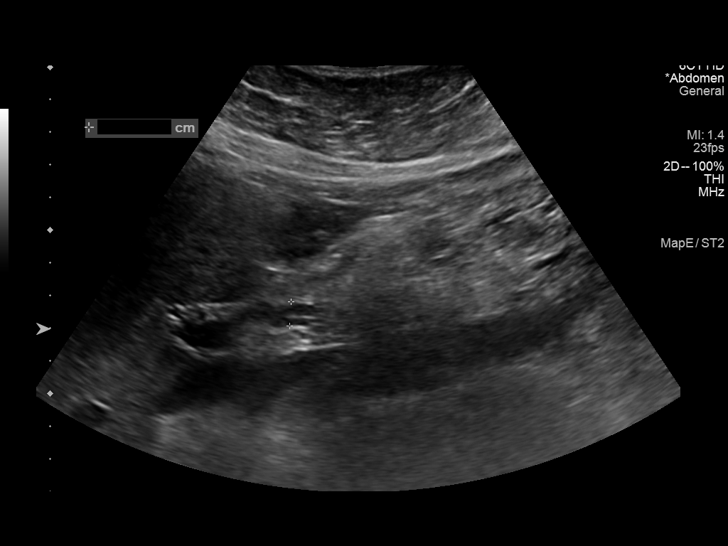
[im 8/96]
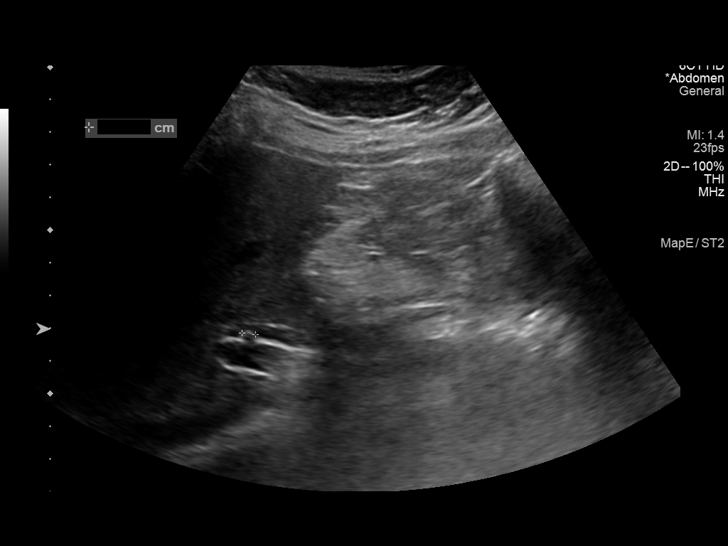
[im 16/96]
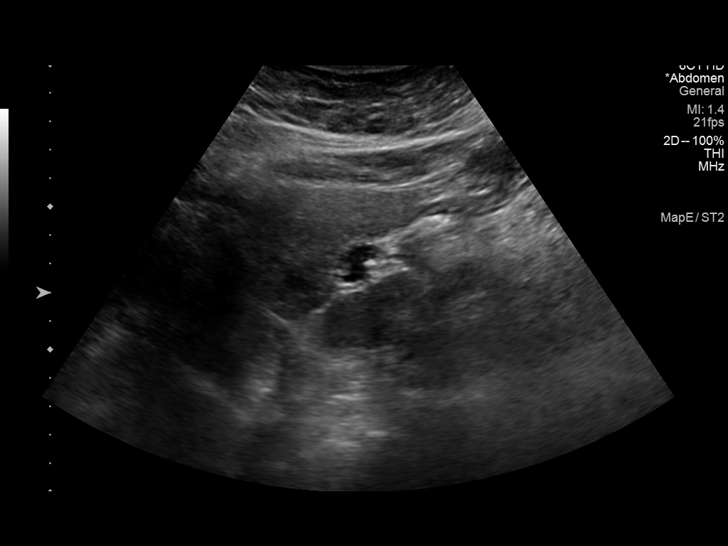
[im 24/96]
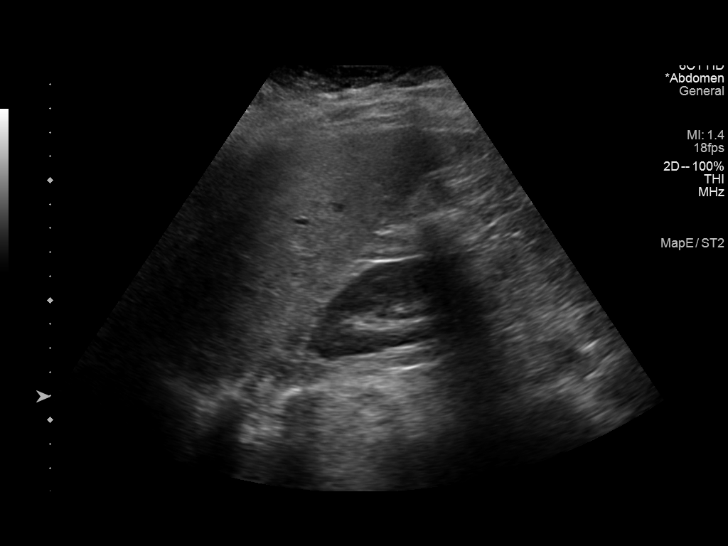
[im 32/96]
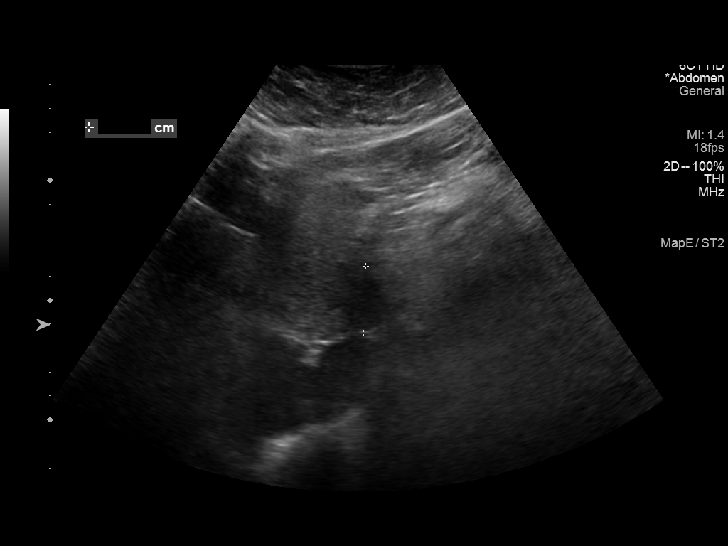
[im 40/96]
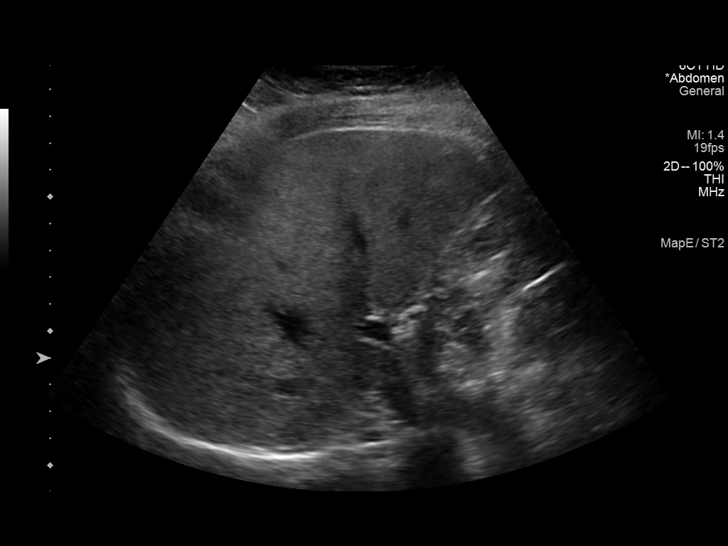
[im 48/96]
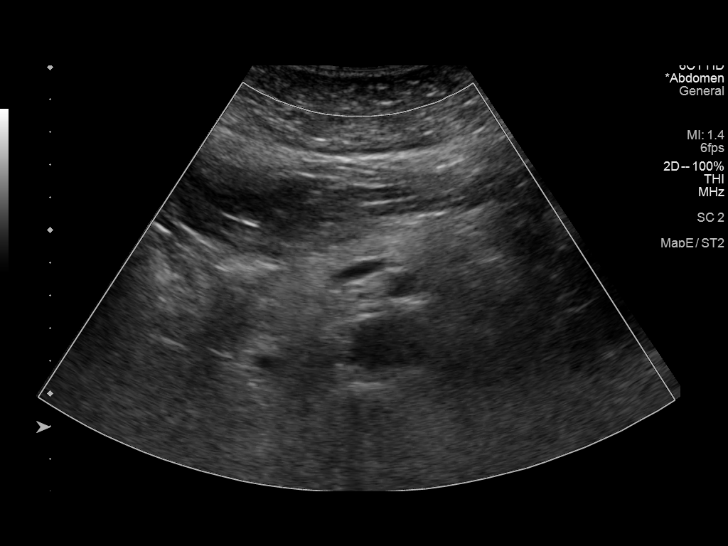
[im 56/96]
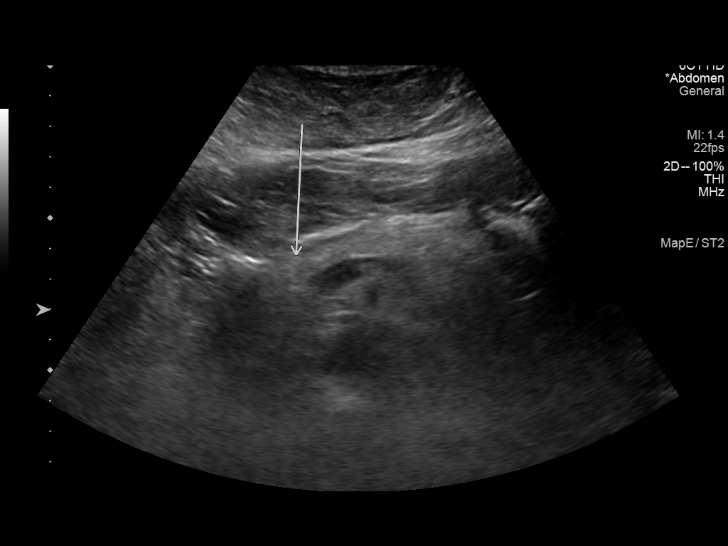
[im 64/96]
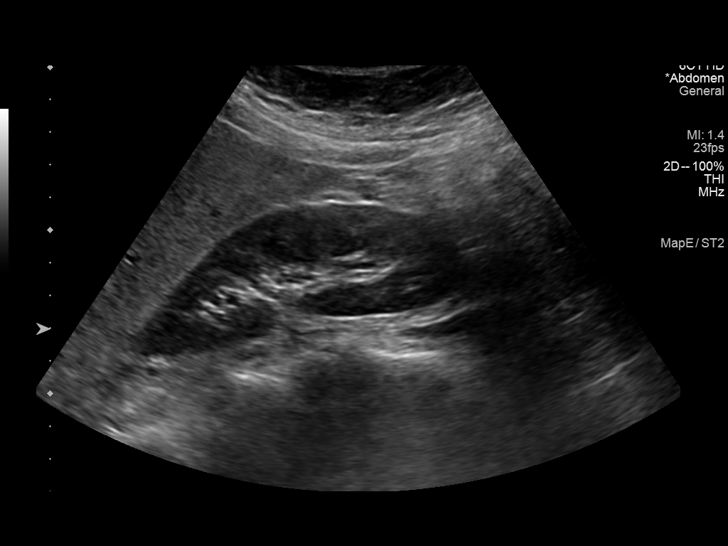
[im 72/96]
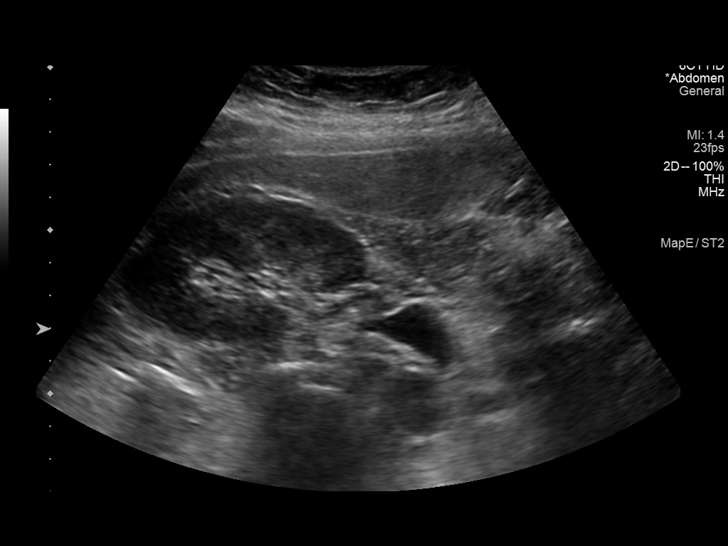
[im 80/96]
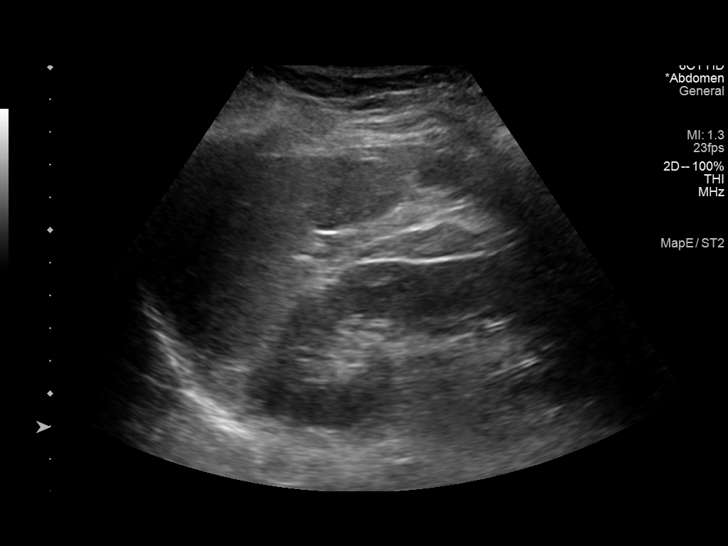
[im 88/96]
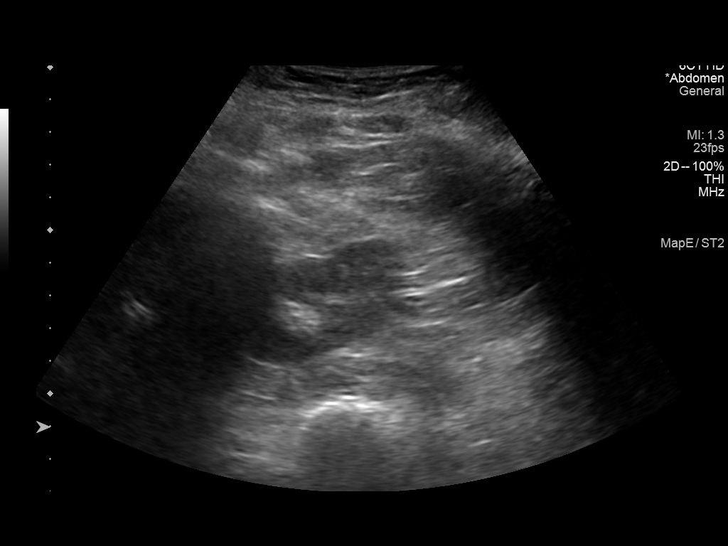
[im 96/96]
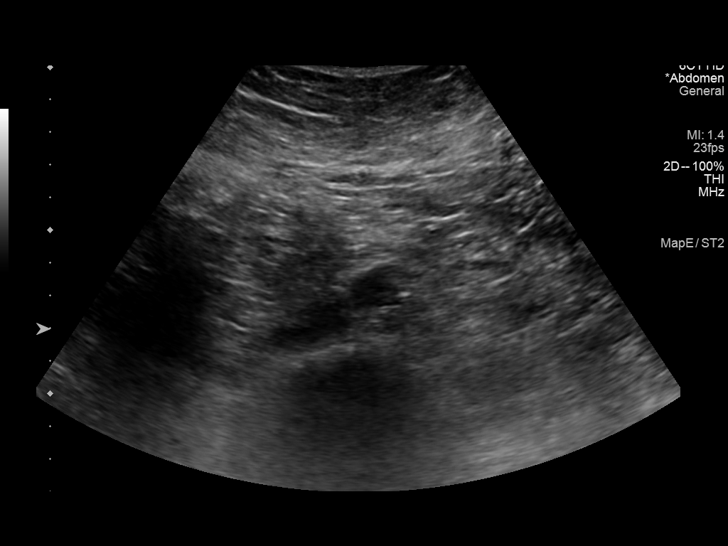

[13 of 25 positions shown; findings below may reference images not displayed]

FINDINGS: Gallbladder: Surgically absent

Common bile duct: Diameter: 7 mm. Echogenic focus is noted along 1
margin of the duct, distal intrahepatic portion could be artifact
from a cholecystectomy clip. An intraductal stone is not excluded.
No intrahepatic bile duct dilation.

Liver: Coarsened echotexture. Overall increased echogenicity. Area
of relative decreased echogenicity noted in the posterior left lobe,
measuring approximately 2.8 x 2.5 x 2.6 cm. No other discrete
lesions. Portal vein is patent on color Doppler imaging with normal
direction of blood flow towards the liver.

IVC: No abnormality visualized.

Pancreas: Mild prominence of the pancreatic duct. Portions of the
head and distal tail were obscured by bowel gas. No defined mass.

Spleen: Size and appearance within normal limits.

Right Kidney: Length: 10.7 cm. Echogenicity within normal limits. No
mass or hydronephrosis visualized.

Left Kidney: Length: 10.8 cm. Echogenicity within normal limits. No
mass or hydronephrosis visualized.

Abdominal aorta: No aneurysm visualized.

Other findings: None.
IMPRESSION: 1. Echogenic focus projects along the margin of the common bile
duct. This could be artifact or an indentation related to a post
cholecystectomy clip. An intraductal stone is possible. There is no
sonographic evidence of intrahepatic bile duct dilation.
2. Coarsened, generalized increased liver parenchymal echogenicity
suggests hepatic steatosis. There is a 2.8 cm hypoechoic lesion
along the posterior left lobe which could reflect focal fatty
sparing. However, true mass is possible. Recommend further
assessment with liver MRI without and with contrast, including MRCP
to assess the bile ducts.
3. Mild dilation of the pancreatic duct. Incomplete imaging of the
pancreatic head and tail. Liver MRI will also allow a more complete
assessment of the pancreas.
4. No other abnormalities. The cause of the patient's left mid back
pain is not evident on this exam.

## 2019-11-17 ENCOUNTER — Other Ambulatory Visit: Payer: Self-pay | Admitting: Family Medicine

## 2019-11-17 DIAGNOSIS — Z760 Encounter for issue of repeat prescription: Secondary | ICD-10-CM

## 2019-11-20 ENCOUNTER — Telehealth: Payer: Self-pay | Admitting: Family Medicine

## 2019-11-20 DIAGNOSIS — R933 Abnormal findings on diagnostic imaging of other parts of digestive tract: Secondary | ICD-10-CM

## 2019-11-20 NOTE — Telephone Encounter (Signed)
Patient received lab results via mychart and wants to know if all is okay or if she needs to do anything.

## 2019-11-20 NOTE — Telephone Encounter (Signed)
Called patient regarding lab results and possible concerns. Per patient she was calling in regards to U/S results she viewed them via her mychart. Patient verbally understood Dr. Ethelene Hal will view U/S then I will give her a call with recommendations.

## 2019-11-20 NOTE — Telephone Encounter (Signed)
Tonya Bowers spoke with her on 4/29.

## 2019-11-21 NOTE — Telephone Encounter (Signed)
Spoke with patient who verbally understood message below. No questions at this time.

## 2019-12-05 DIAGNOSIS — Z13820 Encounter for screening for osteoporosis: Secondary | ICD-10-CM | POA: Diagnosis not present

## 2019-12-21 ENCOUNTER — Ambulatory Visit
Admission: RE | Admit: 2019-12-21 | Discharge: 2019-12-21 | Disposition: A | Discharge status: Home / Self Care | Payer: Medicare Other | Source: Ambulatory Visit | Attending: Family Medicine | Admitting: Family Medicine

## 2019-12-21 DIAGNOSIS — R933 Abnormal findings on diagnostic imaging of other parts of digestive tract: Secondary | ICD-10-CM

## 2019-12-21 DIAGNOSIS — K862 Cyst of pancreas: Secondary | ICD-10-CM | POA: Diagnosis not present

## 2019-12-21 IMAGING — MR MR ABDOMEN WO/W CM
12 of 20 series · 25 of 48 positions shown · IV contrast (20ml Multihance)
Comparison: 20 mL MultiHance

CLINICAL DATA: Left-sided abdominal pain for 6 months.
Indeterminate hepatic lesion on recent ultrasound.

EXAM:
MRI ABDOMEN WITHOUT AND WITH CONTRAST
TECHNIQUE: Multiplanar multisequence MR imaging of the abdomen was performed
both before and after the administration of intravenous contrast.
CONTRAST:  20mL MULTIHANCE GADOBENATE DIMEGLUMINE 529 MG/ML IV SOLN

[Series 3: cor haste · coronal · 5.0mm · 0.74mm/px · 2 of 34 slices shown]
[im 1/34]
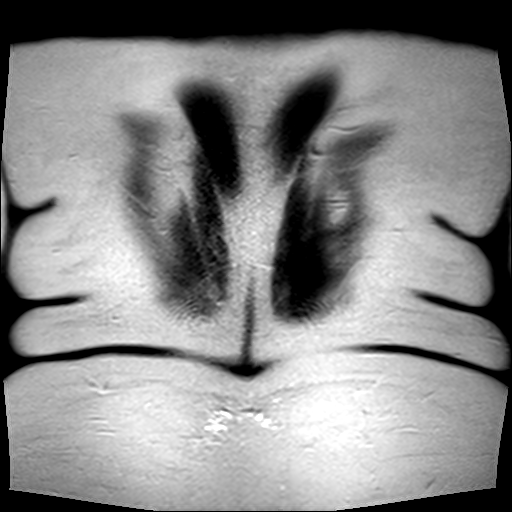
[im 34/34]
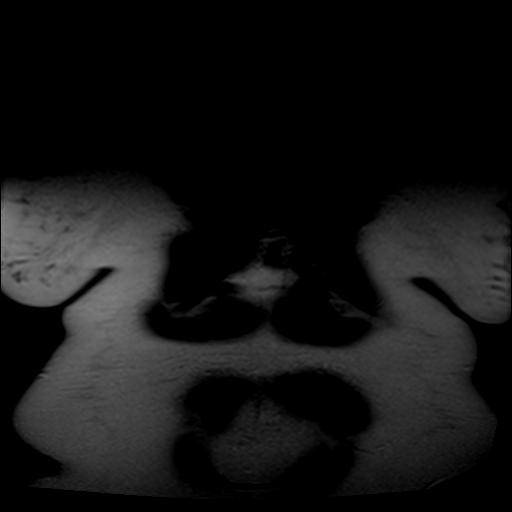

[Series 4: bSSFP · coronal · 5.0mm · 0.78mm/px · 1 of 30 slices shown (1 of 2)]
[im 1/30]
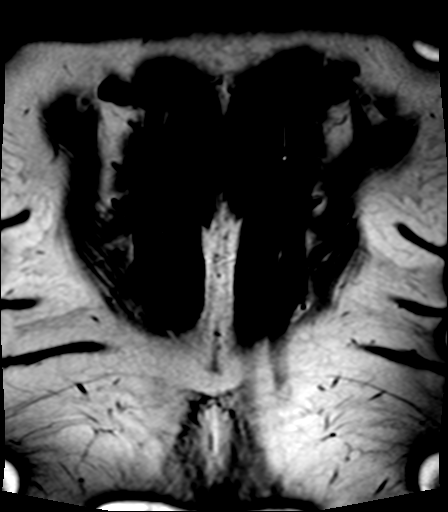

[Series 5: T2 · coronal · 3.0mm · 0.70mm/px · 2 of 56 slices shown (1 of 2)]
[im 1/56]
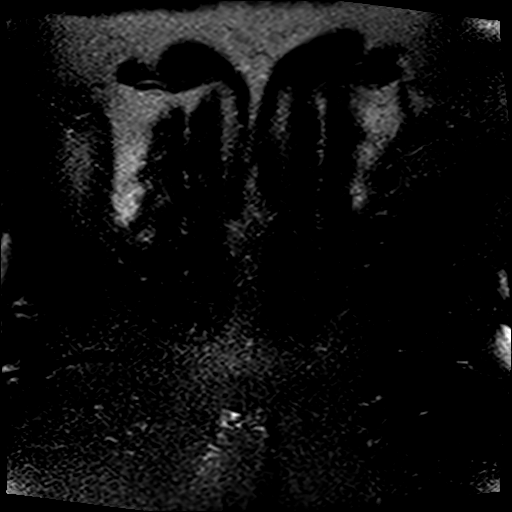
[im 56/56]
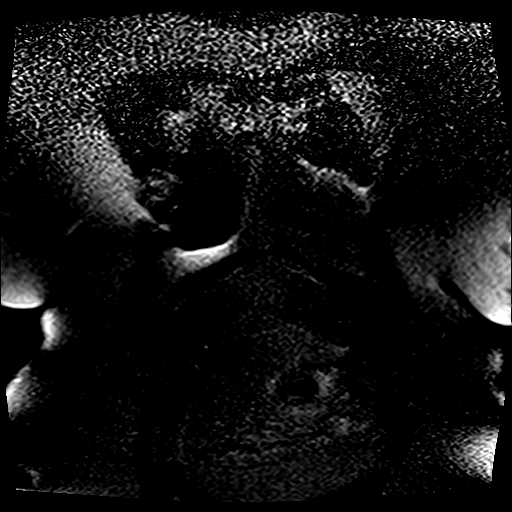

[Series 6: T1 · axial · 6.0mm · 0.74mm/px · z∈[-30,+181]mm · 3 of 66 slices shown]
[im 1/66]
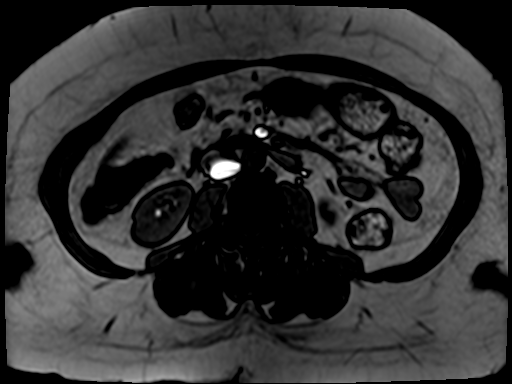
[im 33/66]
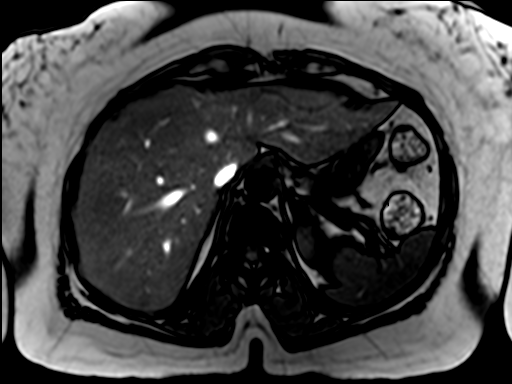
[im 66/66]
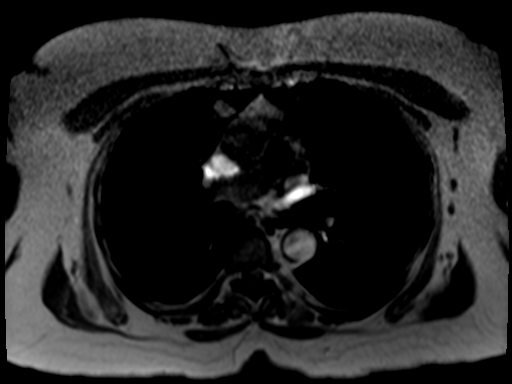

[Series 9: axial haste · axial · 6.0mm · 0.74mm/px · 1 of 33 slices shown]
[im 1/33]
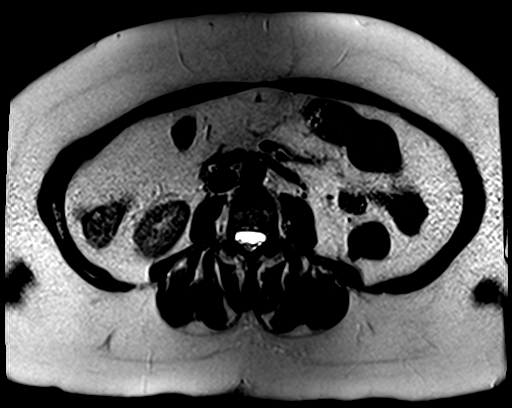

[Series 10: T2 · axial · 6.0mm · 1.12mm/px · 1 of 30 slices shown (2 of 2)]
[im 1/30]
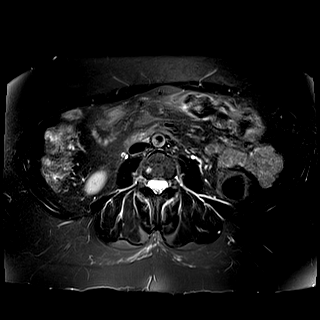

[Series 15: ep2d_[id number]_500_[id number]_trig · axial · 6.0mm · 1.98mm/px · z∈[-34,+175]mm · 3 of 90 slices shown]
[im 1/90]
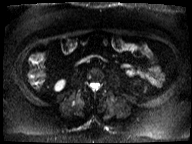
[im 45/90]
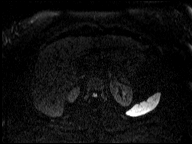
[im 90/90]
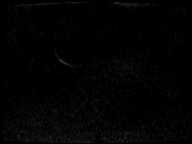

[Series 16: ep2d_[id number]_800_p2_trig_adc · axial · 6.0mm · 1.98mm/px · 1 of 30 slices shown]
[im 1/30]
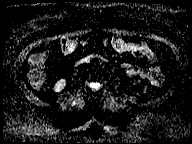

[Series 17: bSSFP · axial · 4.0mm · 0.74mm/px · z∈[-50,+126]mm · 2 of 45 slices shown (2 of 2)]
[im 1/45]
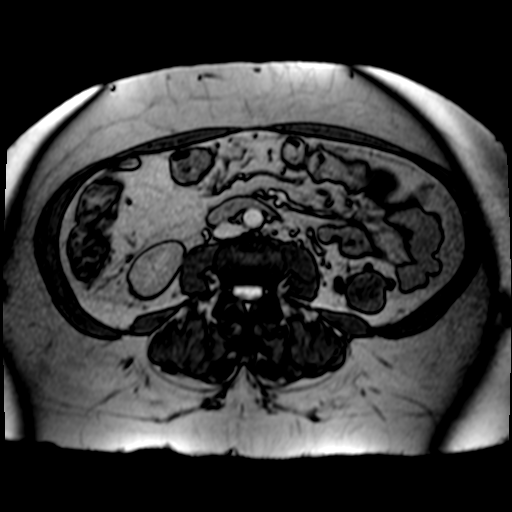
[im 45/45]
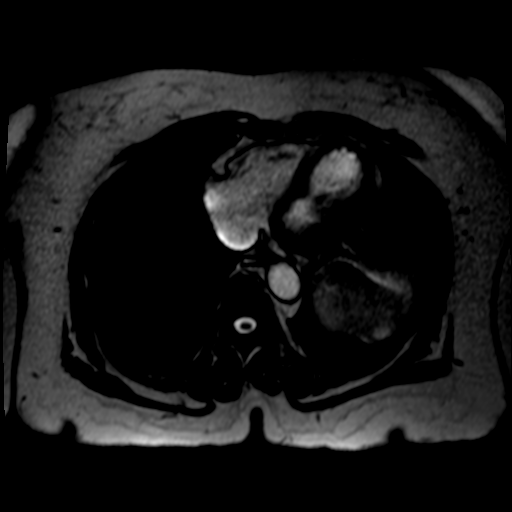

[Series 18: T1 dynamic · axial · non-contrast · 2.5mm · 0.74mm/px · z∈[-64,+133]mm · 3 of 80 slices shown]
[im 1/80]
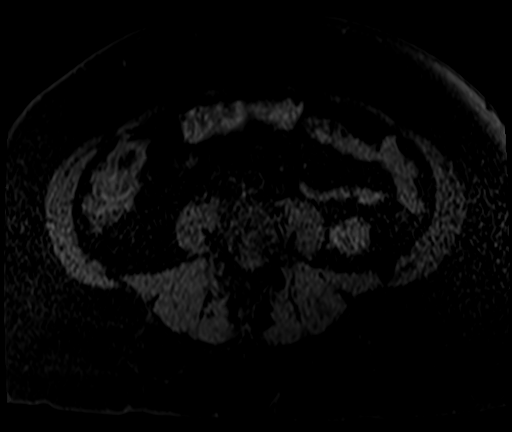
[im 40/80]
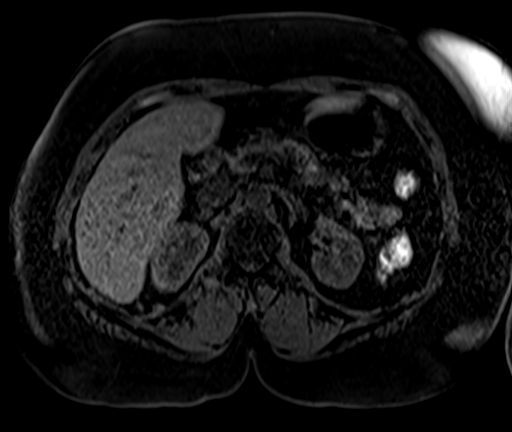
[im 80/80]
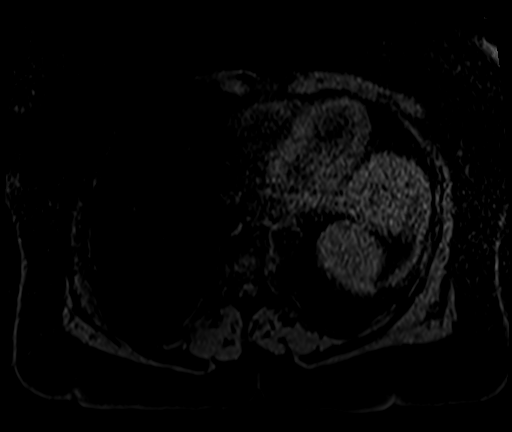

[Series 19: T1 dynamic post-contrast · axial · 2.5mm · 0.74mm/px · z∈[-64,+133]mm · 3 of 80 slices shown (1 of 2)]
[im 1/80]
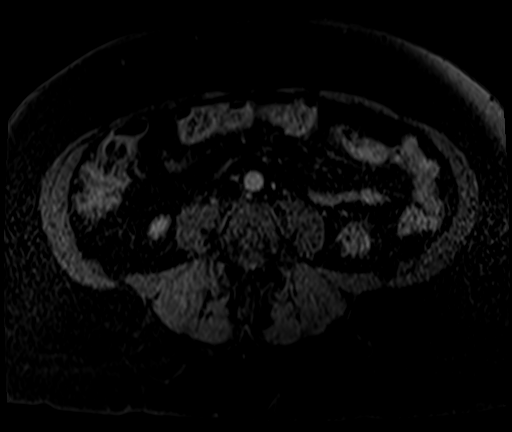
[im 40/80]
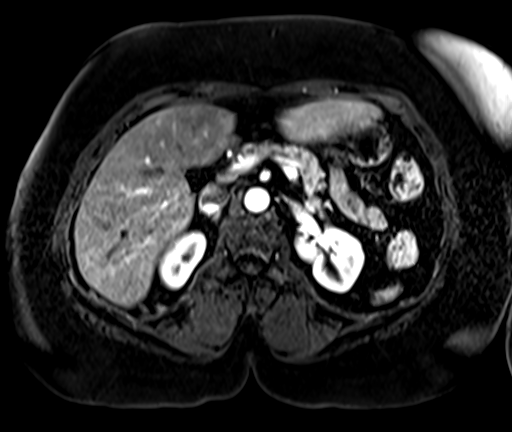
[im 80/80]
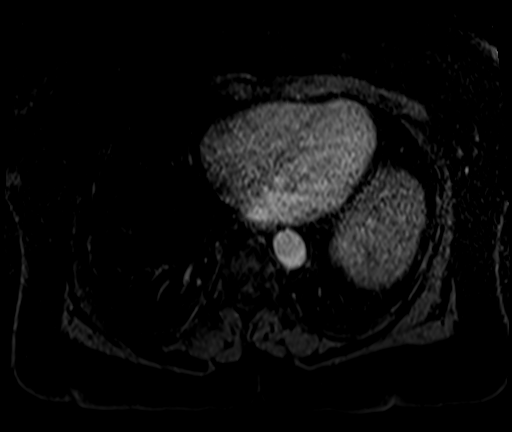

[Series 20: T1 dynamic post-contrast · axial · 2.5mm · 0.74mm/px · z∈[-64,+133]mm · 3 of 80 slices shown (2 of 2)]
[im 1/80]
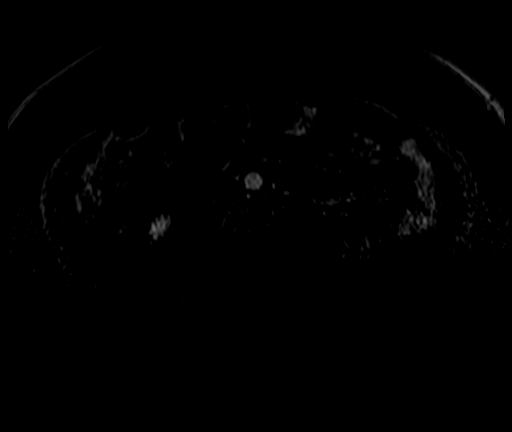
[im 40/80]
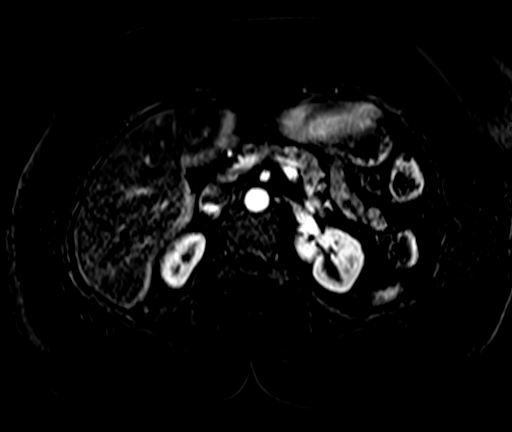
[im 80/80]
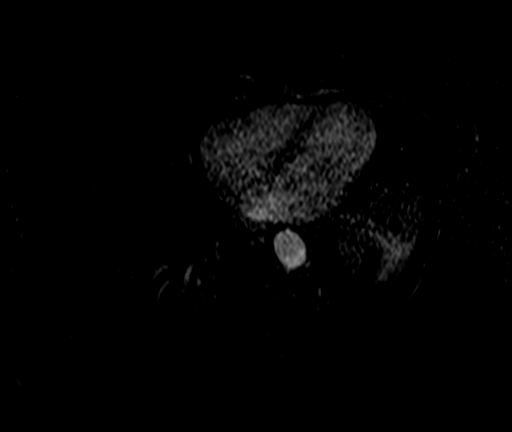

[25 of 48 positions shown; findings below may reference images not displayed]

FINDINGS: Lower chest: No acute findings.

Hepatobiliary: No hepatic masses identified. Prior cholecystectomy.
No evidence of biliary ductal dilatation or choledocholithiasis.

Pancreas: No evidence of pancreatic ductal dilatation or pancreas
divisum. Several tiny cystic foci are seen in the pancreatic body
measuring up to 6 mm. No evidence of solid pancreatic mass. No
evidence of peripancreatic inflammatory changes or fluid
collections.

Spleen:  Within normal limits in size and appearance.

Adrenals/Urinary Tract: No masses identified. No evidence of
hydronephrosis.

Stomach/Bowel: Visualized portion unremarkable.

Vascular/Lymphatic: No pathologically enlarged lymph nodes
identified. No abdominal aortic aneurysm.

Other:  None.

Musculoskeletal:  No suspicious bone lesions identified.
IMPRESSION: 1. Several tiny cystic foci in the pancreatic body measuring up to 6
mm. These likely represent indolent lesions such as tiny side-branch
IPMNs. Recommend continued follow-up by MRI in 2 years. This
recommendation follows ACR consensus guidelines: Management of
Incidental Pancreatic Cysts: A White Paper of the ACR Incidental
Findings Committee. [HOSPITAL] [TJ];[DATE].
2. No evidence of hepatic mass.
3. No evidence of biliary or pancreatic ductal dilatation.

## 2019-12-21 MED ORDER — GADOBENATE DIMEGLUMINE 529 MG/ML IV SOLN
20.0000 mL | Freq: Once | INTRAVENOUS | Status: AC | PRN
  Administered 2019-12-21: 20 mL via INTRAVENOUS

## 2019-12-22 DIAGNOSIS — K862 Cyst of pancreas: Secondary | ICD-10-CM | POA: Insufficient documentation

## 2019-12-22 DIAGNOSIS — K760 Fatty (change of) liver, not elsewhere classified: Secondary | ICD-10-CM | POA: Insufficient documentation

## 2019-12-22 DIAGNOSIS — R7309 Other abnormal glucose: Secondary | ICD-10-CM | POA: Insufficient documentation

## 2020-01-25 ENCOUNTER — Other Ambulatory Visit: Payer: Self-pay | Admitting: Family Medicine

## 2020-01-25 DIAGNOSIS — Z760 Encounter for issue of repeat prescription: Secondary | ICD-10-CM

## 2020-02-01 ENCOUNTER — Ambulatory Visit: Payer: Medicare Other | Admitting: Family Medicine

## 2020-02-05 ENCOUNTER — Ambulatory Visit (INDEPENDENT_AMBULATORY_CARE_PROVIDER_SITE_OTHER): Payer: Medicare Other | Admitting: Family Medicine

## 2020-02-05 ENCOUNTER — Other Ambulatory Visit: Payer: Self-pay

## 2020-02-05 ENCOUNTER — Encounter: Payer: Self-pay | Admitting: Family Medicine

## 2020-02-05 VITALS — BP 132/74 | HR 64 | Temp 97.0°F | Ht 64.0 in | Wt 237.0 lb

## 2020-02-05 DIAGNOSIS — Z760 Encounter for issue of repeat prescription: Secondary | ICD-10-CM

## 2020-02-05 DIAGNOSIS — K862 Cyst of pancreas: Secondary | ICD-10-CM | POA: Diagnosis not present

## 2020-02-05 DIAGNOSIS — I10 Essential (primary) hypertension: Secondary | ICD-10-CM | POA: Diagnosis not present

## 2020-02-05 LAB — BASIC METABOLIC PANEL
BUN: 11 mg/dL (ref 6–23)
CO2: 27 mEq/L (ref 19–32)
Calcium: 9.3 mg/dL (ref 8.4–10.5)
Chloride: 108 mEq/L (ref 96–112)
Creatinine, Ser: 0.75 mg/dL (ref 0.40–1.20)
GFR: 92.96 mL/min (ref >60.00)
Glucose, Bld: 93 mg/dL (ref 70–99)
Potassium: 4.9 mEq/L (ref 3.5–5.1)
Sodium: 140 mEq/L (ref 135–145)

## 2020-02-05 MED ORDER — CHLORTHALIDONE 25 MG PO TABS: 25.0000 mg | ORAL_TABLET | Freq: Every day | ORAL | 1 refills | Status: DC

## 2020-02-05 NOTE — Progress Notes (Signed)
Established Patient Office Visit  Subjective:  Patient ID: Tonya Bowers, female    DOB: 10/22/1950  Age: 69 y.o. MRN: [id number]  CC:  Chief Complaint  Patient presents with  . Follow-up    3 month follow up on BP, no concerns.     HPI Tonya Bowers presents for follow-up of hypertension. Blood pressure at home has been running in the 130s over 70s. She is consuming a potassium rich diet she tells me. No issues taking the medication.  Past Medical History:  Diagnosis Date  . Arthritis   . Cataract   . Diverticulosis   . GERD (gastroesophageal reflux disease)   . Hyperplastic colon polyp 09/09/06    Past Surgical History:  Procedure Laterality Date  . ABDOMINAL HYSTERECTOMY    . CHOLECYSTECTOMY    . COLONOSCOPY    . POLYPECTOMY    . TUBAL LIGATION      Family History  Problem Relation Age of Onset  . Hypertension Mother   . Hypertension Father   . Kidney disease Brother        kidney transplant   . Hypertension Brother   . Colon cancer Neg Hx     Social History   Socioeconomic History  . Marital status: Married    Spouse name: Not on file  . Number of children: Not on file  . Years of education: Not on file  . Highest education level: Not on file  Occupational History  . Not on file  Tobacco Use  . Smoking status: Never Smoker  . Smokeless tobacco: Never Used  Substance and Sexual Activity  . Alcohol use: Yes    Comment: maybe occasionally; twice a year  . Drug use: No  . Sexual activity: Not on file  Other Topics Concern  . Not on file  Social History Narrative  . Not on file   Social Determinants of Health   Financial Resource Strain:   . Difficulty of Paying Living Expenses:   Food Insecurity:   . Worried About Charity fundraiser in the Last Year:   . Arboriculturist in the Last Year:   Transportation Needs:   . Film/video editor (Medical):   Marland Kitchen Lack of Transportation (Non-Medical):   Physical Activity:   . Days of Exercise per Week:    . Minutes of Exercise per Session:   Stress:   . Feeling of Stress :   Social Connections:   . Frequency of Communication with Friends and Family:   . Frequency of Social Gatherings with Friends and Family:   . Attends Religious Services:   . Active Member of Clubs or Organizations:   . Attends Archivist Meetings:   Marland Kitchen Marital Status:   Intimate Partner Violence:   . Fear of Current or Ex-Partner:   . Emotionally Abused:   Marland Kitchen Physically Abused:   . Sexually Abused:     Outpatient Medications Prior to Visit  Medication Sig Dispense Refill  . calcium carbonate (OSCAL) 1500 (600 Ca) MG TABS tablet Take 600 mg of elemental calcium by mouth 2 (two) times daily with a meal.    . estradiol (ESTRACE) 0.5 MG tablet Take 0.5 mg by mouth daily.  2  . chlorthalidone (HYGROTON) 25 MG tablet TAKE 1 TABLET BY MOUTH  DAILY 90 tablet 0  . OVER THE COUNTER MEDICATION  (Patient not taking: Reported on 02/05/2020)    . OVER THE COUNTER MEDICATION  (Patient not taking: Reported  on 02/05/2020)    . potassium chloride SA (K-DUR) 20 MEQ tablet Take 1 tablet (20 mEq total) by mouth daily. (Patient not taking: Reported on 11/02/2019) 30 tablet 5   No facility-administered medications prior to visit.    Allergies  Allergen Reactions  . Lisinopril Cough  . Penicillins     ROS Review of Systems  Constitutional: Negative.   HENT: Negative.   Eyes: Negative for photophobia and visual disturbance.  Respiratory: Negative.   Cardiovascular: Negative.   Gastrointestinal: Negative.   Endocrine: Negative for polyphagia and polyuria.  Genitourinary: Negative.   Musculoskeletal: Negative for gait problem and joint swelling.  Neurological: Negative for tremors and speech difficulty.  Hematological: Does not bruise/bleed easily.  Psychiatric/Behavioral: Negative.       Objective:    Physical Exam Vitals and nursing note reviewed.  Constitutional:      General: She is not in acute distress.     Appearance: Normal appearance. She is not ill-appearing, toxic-appearing or diaphoretic.  HENT:     Head: Normocephalic and atraumatic.     Right Ear: External ear normal.     Left Ear: External ear normal.  Eyes:     General:        Right eye: No discharge.        Left eye: No discharge.     Conjunctiva/sclera: Conjunctivae normal.     Pupils: Pupils are equal, round, and reactive to light.  Cardiovascular:     Rate and Rhythm: Normal rate and regular rhythm.  Pulmonary:     Effort: Pulmonary effort is normal.     Breath sounds: Normal breath sounds.  Abdominal:     General: Bowel sounds are normal.  Musculoskeletal:     Cervical back: No rigidity or tenderness.     Right lower leg: No edema.     Left lower leg: No edema.  Lymphadenopathy:     Cervical: No cervical adenopathy.  Skin:    General: Skin is warm and dry.  Neurological:     Mental Status: She is alert and oriented to person, place, and time.  Psychiatric:        Mood and Affect: Mood normal.        Behavior: Behavior normal.     BP (!) 132/74   Pulse 64   Temp (!) 97 F (36.1 C) (Tympanic)   Ht 5\' 4"  (1.626 m)   Wt (!) 237 lb (107.5 kg)   LMP 07/07/1995   SpO2 97%   BMI 40.68 kg/m  Wt Readings from Last 3 Encounters:  02/05/20 (!) 237 lb (107.5 kg)  11/02/19 239 lb 12.8 oz (108.8 kg)  12/15/18 240 lb 6 oz (109 kg)     Health Maintenance Due  Topic Date Due  . MAMMOGRAM  12/12/2016  . DEXA SCAN  Never done  . PNA vac Low Risk Adult (1 of 2 - PCV13) Never done  . INFLUENZA VACCINE  02/04/2020    There are no preventive care reminders to display for this patient.  Lab Results  Component Value Date   TSH 2.18 12/15/2018   Lab Results  Component Value Date   WBC 9.3 11/02/2019   HGB 12.5 11/02/2019   HCT 38.0 11/02/2019   MCV 82.9 11/02/2019   PLT 250.0 11/02/2019   Lab Results  Component Value Date   NA 140 11/02/2019   K 3.8 11/02/2019   CO2 34 (H) 11/02/2019   GLUCOSE 113 (H)  11/02/2019   BUN  13 11/02/2019   CREATININE 0.88 11/02/2019   BILITOT 0.7 11/02/2019   ALKPHOS 62 11/02/2019   AST 16 11/02/2019   ALT 16 11/02/2019   PROT 7.0 11/02/2019   ALBUMIN 3.9 11/02/2019   CALCIUM 9.1 11/02/2019   GFR 77.36 11/02/2019   Lab Results  Component Value Date   CHOL 189 11/02/2019   Lab Results  Component Value Date   HDL 43.90 11/02/2019   Lab Results  Component Value Date   LDLCALC 120 (H) 11/02/2019   Lab Results  Component Value Date   TRIG 127.0 11/02/2019   Lab Results  Component Value Date   CHOLHDL 4 11/02/2019   Lab Results  Component Value Date   HGBA1C 6.0 11/02/2019      Assessment & Plan:   Problem List Items Addressed This Visit      Cardiovascular and Mediastinum   Essential hypertension - Primary   Relevant Medications   chlorthalidone (HYGROTON) 25 MG tablet   Other Relevant Orders   Basic metabolic panel     Other   Medication refill   Relevant Medications   chlorthalidone (HYGROTON) 25 MG tablet      Meds ordered this encounter  Medications  . chlorthalidone (HYGROTON) 25 MG tablet    Sig: Take 1 tablet (25 mg total) by mouth daily.    Dispense:  90 tablet    Refill:  1    Requesting 1 year supply    Follow-up: Return in about 6 months (around 08/07/2020), or May need to see you sooner if potassium is low.. May consider alternative blood pressure medicine with persistent hypokalemia.   Libby Maw, MD

## 2020-02-05 NOTE — Patient Instructions (Signed)
Managing Your Hypertension Hypertension is commonly called high blood pressure. This is when the force of your blood pressing against the walls of your arteries is too strong. Arteries are blood vessels that carry blood from your heart throughout your body. Hypertension forces the heart to work harder to pump blood, and may cause the arteries to become narrow or stiff. Having untreated or uncontrolled hypertension can cause heart attack, stroke, kidney disease, and other problems. What are blood pressure readings? A blood pressure reading consists of a higher number over a lower number. Ideally, your blood pressure should be below 120/80. The first ("top") number is called the systolic pressure. It is a measure of the pressure in your arteries as your heart beats. The second ("bottom") number is called the diastolic pressure. It is a measure of the pressure in your arteries as the heart relaxes. What does my blood pressure reading mean? Blood pressure is classified into four stages. Based on your blood pressure reading, your health care provider may use the following stages to determine what type of treatment you need, if any. Systolic pressure and diastolic pressure are measured in a unit called mm Hg. Normal  Systolic pressure: below 120.  Diastolic pressure: below 80. Elevated  Systolic pressure: 120-129.  Diastolic pressure: below 80. Hypertension stage 1  Systolic pressure: 130-139.  Diastolic pressure: 80-89. Hypertension stage 2  Systolic pressure: 140 or above.  Diastolic pressure: 90 or above. What health risks are associated with hypertension? Managing your hypertension is an important responsibility. Uncontrolled hypertension can lead to:  A heart attack.  A stroke.  A weakened blood vessel (aneurysm).  Heart failure.  Kidney damage.  Eye damage.  Metabolic syndrome.  Memory and concentration problems. What changes can I make to manage my  hypertension? Hypertension can be managed by making lifestyle changes and possibly by taking medicines. Your health care provider will help you make a plan to bring your blood pressure within a normal range. Eating and drinking   Eat a diet that is high in fiber and potassium, and low in salt (sodium), added sugar, and fat. An example eating plan is called the DASH (Dietary Approaches to Stop Hypertension) diet. To eat this way: ? Eat plenty of fresh fruits and vegetables. Try to fill half of your plate at each meal with fruits and vegetables. ? Eat whole grains, such as whole wheat pasta, brown rice, or whole grain bread. Fill about one quarter of your plate with whole grains. ? Eat low-fat diary products. ? Avoid fatty cuts of meat, processed or cured meats, and poultry with skin. Fill about one quarter of your plate with lean proteins such as fish, chicken without skin, beans, eggs, and tofu. ? Avoid premade and processed foods. These tend to be higher in sodium, added sugar, and fat.  Reduce your daily sodium intake. Most people with hypertension should eat less than 1,500 mg of sodium a day.  Limit alcohol intake to no more than 1 drink a day for nonpregnant women and 2 drinks a day for men. One drink equals 12 oz of beer, 5 oz of wine, or 1 oz of hard liquor. Lifestyle  Work with your health care provider to maintain a healthy body weight, or to lose weight. Ask what an ideal weight is for you.  Get at least 30 minutes of exercise that causes your heart to beat faster (aerobic exercise) most days of the week. Activities may include walking, swimming, or biking.  Include exercise   to strengthen your muscles (resistance exercise), such as weight lifting, as part of your weekly exercise routine. Try to do these types of exercises for 30 minutes at least 3 days a week.  Do not use any products that contain nicotine or tobacco, such as cigarettes and e-cigarettes. If you need help quitting,  ask your health care provider.  Control any long-term (chronic) conditions you have, such as high cholesterol or diabetes. Monitoring  Monitor your blood pressure at home as told by your health care provider. Your personal target blood pressure may vary depending on your medical conditions, your age, and other factors.  Have your blood pressure checked regularly, as often as told by your health care provider. Working with your health care provider  Review all the medicines you take with your health care provider because there may be side effects or interactions.  Talk with your health care provider about your diet, exercise habits, and other lifestyle factors that may be contributing to hypertension.  Visit your health care provider regularly. Your health care provider can help you create and adjust your plan for managing hypertension. Will I need medicine to control my blood pressure? Your health care provider may prescribe medicine if lifestyle changes are not enough to get your blood pressure under control, and if:  Your systolic blood pressure is 130 or higher.  Your diastolic blood pressure is 80 or higher. Take medicines only as told by your health care provider. Follow the directions carefully. Blood pressure medicines must be taken as prescribed. The medicine does not work as well when you skip doses. Skipping doses also puts you at risk for problems. Contact a health care provider if:  You think you are having a reaction to medicines you have taken.  You have repeated (recurrent) headaches.  You feel dizzy.  You have swelling in your ankles.  You have trouble with your vision. Get help right away if:  You develop a severe headache or confusion.  You have unusual weakness or numbness, or you feel faint.  You have severe pain in your chest or abdomen.  You vomit repeatedly.  You have trouble breathing. Summary  Hypertension is when the force of blood pumping  through your arteries is too strong. If this condition is not controlled, it may put you at risk for serious complications.  Your personal target blood pressure may vary depending on your medical conditions, your age, and other factors. For most people, a normal blood pressure is less than 120/80.  Hypertension is managed by lifestyle changes, medicines, or both. Lifestyle changes include weight loss, eating a healthy, low-sodium diet, exercising more, and limiting alcohol. This information is not intended to replace advice given to you by your health care provider. Make sure you discuss any questions you have with your health care provider. Document Revised: 10/14/2018 Document Reviewed: 05/20/2016 Elsevier Patient Education  [place].  Hypokalemia Hypokalemia means that the amount of potassium in the blood is lower than normal. Potassium is a chemical (electrolyte) that helps regulate the amount of fluid in the body. It also stimulates muscle tightening (contraction) and helps nerves work properly. Normally, most of the body's potassium is inside cells, and only a very small amount is in the blood. Because the amount in the blood is so small, minor changes to potassium levels in the blood can be life-threatening. What are the causes? This condition may be caused by:  Antibiotic medicine.  Diarrhea or vomiting. Taking too much of a  medicine that helps you have a bowel movement (laxative) can cause diarrhea and lead to hypokalemia.  Chronic kidney disease (CKD).  Medicines that help the body get rid of excess fluid (diuretics).  Eating disorders, such as bulimia.  Low magnesium levels in the body.  Sweating a lot. What are the signs or symptoms? Symptoms of this condition include:  Weakness.  Constipation.  Fatigue.  Muscle cramps.  Mental confusion.  Skipped heartbeats or irregular heartbeat (palpitations).  Tingling or numbness. How is this diagnosed? This  condition is diagnosed with a blood test. How is this treated? This condition may be treated by:  Taking potassium supplements by mouth.  Adjusting the medicines that you take.  Eating more foods that contain a lot of potassium. If your potassium level is very low, you may need to get potassium through an IV and be monitored in the hospital. Follow these instructions at home:   Take over-the-counter and prescription medicines only as told by your health care provider. This includes vitamins and supplements.  Eat a healthy diet. A healthy diet includes fresh fruits and vegetables, whole grains, healthy fats, and lean proteins.  If instructed, eat more foods that contain a lot of potassium. This includes: ? Nuts, such as peanuts and pistachios. ? Seeds, such as sunflower seeds and pumpkin seeds. ? Peas, lentils, and lima beans. ? Whole grain and bran cereals and breads. ? Fresh fruits and vegetables, such as apricots, avocado, bananas, cantaloupe, kiwi, oranges, tomatoes, asparagus, and potatoes. ? Orange juice. ? Tomato juice. ? Red meats. ? Yogurt.  Keep all follow-up visits as told by your health care provider. This is important. Contact a health care provider if you:  Have weakness that gets worse.  Feel your heart pounding or racing.  Vomit.  Have diarrhea.  Have diabetes (diabetes mellitus) and you have trouble keeping your blood sugar (glucose) in your target range. Get help right away if you:  Have chest pain.  Have shortness of breath.  Have vomiting or diarrhea that lasts for more than 2 days.  Faint. Summary  Hypokalemia means that the amount of potassium in the blood is lower than normal.  This condition is diagnosed with a blood test.  Hypokalemia may be treated by taking potassium supplements, adjusting the medicines that you take, or eating more foods that are high in potassium.  If your potassium level is very low, you may need to get potassium  through an IV and be monitored in the hospital. This information is not intended to replace advice given to you by your health care provider. Make sure you discuss any questions you have with your health care provider. Document Revised: 02/02/2018 Document Reviewed: 02/02/2018 Elsevier Patient Education  [place].

## 2020-02-23 ENCOUNTER — Encounter: Payer: Self-pay | Admitting: Family Medicine

## 2020-03-01 DIAGNOSIS — Z1231 Encounter for screening mammogram for malignant neoplasm of breast: Secondary | ICD-10-CM | POA: Diagnosis not present

## 2020-03-06 ENCOUNTER — Ambulatory Visit (INDEPENDENT_AMBULATORY_CARE_PROVIDER_SITE_OTHER): Payer: Medicare Other

## 2020-03-06 VITALS — Ht 64.0 in | Wt 237.0 lb

## 2020-03-06 DIAGNOSIS — Z0000 Encounter for general adult medical examination without abnormal findings: Secondary | ICD-10-CM | POA: Diagnosis not present

## 2020-03-06 NOTE — Progress Notes (Signed)
Subjective:   Tonya Bowers is a 69 y.o. female who presents for an Initial Medicare Annual Wellness Visit.  I connected with Denice today by telephone and verified that I am speaking with the correct person using two identifiers. Location patient: home Location provider: work Persons participating in the virtual visit: patient, Marine scientist.    I discussed the limitations, risks, security and privacy concerns of performing an evaluation and management service by telephone and the availability of in person appointments. I also discussed with the patient that there may be a patient responsible charge related to this service. The patient expressed understanding and verbally consented to this telephonic visit.    Interactive audio and video telecommunications were attempted between this provider and patient, however failed, due to patient having technical difficulties OR patient did not have access to video capability.  We continued and completed visit with audio only.  Some vital signs may be absent or patient reported.   Time Spent with patient on telephone encounter: 20 minutes  Review of Systems     Cardiac Risk Factors include: advanced age (>32men, >32 women);hypertension;sedentary lifestyle;obesity (BMI >30kg/m2)     Objective:    Today's Vitals   03/06/20 1516  Weight: 237 lb (107.5 kg)  Height: 5\' 4"  (1.626 m)   Body mass index is 40.68 kg/m.  Advanced Directives 03/06/2020 10/30/2016 10/16/2016  Does Patient Have a Medical Advance Directive? No No No  Would patient like information on creating a medical advance directive? Yes (MAU/Ambulatory/Procedural Areas - Information given) - -    Current Medications (verified) Outpatient Encounter Medications as of 03/06/2020  Medication Sig  . calcium carbonate (OSCAL) 1500 (600 Ca) MG TABS tablet Take 600 mg of elemental calcium by mouth 2 (two) times daily with a meal.  . chlorthalidone (HYGROTON) 25 MG tablet Take 1 tablet (25 mg total)  by mouth daily.  . cholecalciferol (VITAMIN D3) 25 MCG (1000 UNIT) tablet Take 1,000 Units by mouth daily.  Marland Kitchen estradiol (ESTRACE) 0.5 MG tablet Take 0.5 mg by mouth daily.  . Multiple Vitamins-Minerals (MULTIVITAMIN WITH MINERALS) tablet Take 1 tablet by mouth daily.   No facility-administered encounter medications on file as of 03/06/2020.    Allergies (verified) Lisinopril and Penicillins   History: Past Medical History:  Diagnosis Date  . Arthritis   . Cataract   . Diverticulosis   . GERD (gastroesophageal reflux disease)   . Hyperplastic colon polyp 09/09/06   Past Surgical History:  Procedure Laterality Date  . ABDOMINAL HYSTERECTOMY    . CHOLECYSTECTOMY    . COLONOSCOPY    . POLYPECTOMY    . TUBAL LIGATION     Family History  Problem Relation Age of Onset  . Hypertension Mother   . Hypertension Father   . Kidney disease Brother        kidney transplant   . Hypertension Brother   . Colon cancer Neg Hx    Social History   Socioeconomic History  . Marital status: Married    Spouse name: Not on file  . Number of children: Not on file  . Years of education: Not on file  . Highest education level: Not on file  Occupational History  . Occupation: retired  Tobacco Use  . Smoking status: Never Smoker  . Smokeless tobacco: Never Used  Substance and Sexual Activity  . Alcohol use: Yes    Comment: maybe occasionally; twice a year  . Drug use: No  . Sexual activity: Not on file  Other Topics Concern  . Not on file  Social History Narrative  . Not on file   Social Determinants of Health   Financial Resource Strain: Low Risk   . Difficulty of Paying Living Expenses: Not hard at all  Food Insecurity: No Food Insecurity  . Worried About Charity fundraiser in the Last Year: Never true  . Ran Out of Food in the Last Year: Never true  Transportation Needs: No Transportation Needs  . Lack of Transportation (Medical): No  . Lack of Transportation (Non-Medical): No    Physical Activity: Inactive  . Days of Exercise per Week: 0 days  . Minutes of Exercise per Session: 0 min  Stress: No Stress Concern Present  . Feeling of Stress : Not at all  Social Connections: Socially Integrated  . Frequency of Communication with Friends and Family: More than three times a week  . Frequency of Social Gatherings with Friends and Family: Once a week  . Attends Religious Services: 1 to 4 times per year  . Active Member of Clubs or Organizations: Yes  . Attends Archivist Meetings: 1 to 4 times per year  . Marital Status: Married    Tobacco Counseling Counseling given: Not Answered   Clinical Intake:  Pre-visit preparation completed: Yes  Pain : No/denies pain     Nutritional Status: BMI > 30  Obese Nutritional Risks: None Diabetes: No  How often do you need to have someone help you when you read instructions, pamphlets, or other written materials from your doctor or pharmacy?: 1 - Never What is the last grade level you completed in school?: 3 yrs of college  Diabetic?No  Interpreter Needed?: No  Information entered by :: Caroleen Hamman LPN   Activities of Daily Living In your present state of health, do you have any difficulty performing the following activities: 03/06/2020  Hearing? N  Vision? N  Difficulty concentrating or making decisions? N  Walking or climbing stairs? N  Dressing or bathing? N  Doing errands, shopping? N  Preparing Food and eating ? N  Using the Toilet? N  In the past six months, have you accidently leaked urine? N  Do you have problems with loss of bowel control? N  Managing your Medications? N  Managing your Finances? N  Some recent data might be hidden    Patient Care Team: Libby Maw, MD as PCP - General (Family Medicine)  Indicate any recent Medical Services you may have received from other than [facility] providers in the past year (date may be approximate).     Assessment:   This is a  routine wellness examination for Tonya Bowers.  Hearing/Vision screen  Hearing Screening   125Hz  250Hz  500Hz  1000Hz  2000Hz  3000Hz  4000Hz  6000Hz  8000Hz   Right ear:           Left ear:           Comments: No issues  Vision Screening Comments: Wears glasses Last eye exam-2020-Dr. Clydene Laming  Dietary issues and exercise activities discussed: Current Exercise Habits: The patient does not participate in regular exercise at present, Exercise limited by: None identified  Goals    . Patient Stated     Eating healthier, drinking more water  & being more active      Depression Screen PHQ 2/9 Scores 03/06/2020 11/02/2019 03/25/2015  PHQ - 2 Score 0 0 0    Fall Risk Fall Risk  03/06/2020 11/02/2019  Falls in the past year? 0 0  Number  falls in past yr: 0 -  Injury with Fall? 0 -  Follow up Falls prevention discussed -    Any stairs in or around the home? Yes  If so, are there any without handrails? No  Home free of loose throw rugs in walkways, pet beds, electrical cords, etc? Yes  Adequate lighting in your home to reduce risk of falls? Yes   ASSISTIVE DEVICES UTILIZED TO PREVENT FALLS:  Life alert? No  Use of a cane, walker or w/c? No  Grab bars in the bathroom? Yes  Shower chair or bench in shower? No  Elevated toilet seat or a handicapped toilet? No   TIMED UP AND GO:  Was the test performed? No . Phone visit  Cognitive Function:No cognitive impairment noted. Patient does word search puzzles for brain health.        Immunizations Immunization History  Administered Date(s) Administered  . Fluad Quad(high Dose 65+) 04/12/2019  . Influenza-Unspecified 05/03/2015, 04/11/2016, 04/08/2018  . PFIZER SARS-COV-2 Vaccination 08/11/2019, 09/05/2019  . Tdap 06/10/2016    TDAP status: Up to date   Flu Vaccine status: Declined, Education has been provided regarding the importance of this vaccine but patient still declined. Advised may receive this vaccine at local pharmacy or Health Dept. Aware  to provide a copy of the vaccination record if obtained from local pharmacy or Health Dept. Verbalized acceptance and understanding.  Phone visit.   Pneumococcal vaccine status: Declined,  Education has been provided regarding the importance of this vaccine but patient still declined. Advised may receive this vaccine at local pharmacy or Health Dept. Aware to provide a copy of the vaccination record if obtained from local pharmacy or Health Dept. Verbalized acceptance and understanding.  Phone visit  Covid-19 vaccine status: Completed vaccines  Qualifies for Shingles Vaccine? Yes   Zostavax completed No   Shingrix Completed?: No.    Education has been provided regarding the importance of this vaccine. Patient has been advised to call insurance company to determine out of pocket expense if they have not yet received this vaccine. Advised may also receive vaccine at local pharmacy or Health Dept. Verbalized acceptance and understanding.  Screening Tests Health Maintenance  Topic Date Due  . DEXA SCAN  Never done  . PNA vac Low Risk Adult (1 of 2 - PCV13) Never done  . INFLUENZA VACCINE  02/04/2020  . MAMMOGRAM  03/01/2021  . TETANUS/TDAP  06/10/2026  . COLONOSCOPY  10/31/2026  . COVID-19 Vaccine  Completed  . Hepatitis C Screening  Completed    Health Maintenance  Health Maintenance Due  Topic Date Due  . DEXA SCAN  Never done  . PNA vac Low Risk Adult (1 of 2 - PCV13) Never done  . INFLUENZA VACCINE  02/04/2020    Colorectal cancer screening: Completed 10/30/2016. Repeat every 10 years   Mammogram status: Completed 03/01/2020. Repeat every year Done at GYN office. Requested patient have copy of results sent to PCP.  Bone Density Status: Completed 01/2020 at GYN office. Requested patient have copy of results sent to PCP.  Lung Cancer Screening: (Low Dose CT Chest recommended if Age 8-80 years, 30 pack-year currently smoking OR have quit w/in 15years.) does not qualify.     Additional Screening:  Hepatitis C Screening:  Completed 03/25/2015  Vision Screening: Recommended annual ophthalmology exams for early detection of glaucoma and other disorders of the eye. Is the patient up to date with their annual eye exam?  Yes  Who is the provider  or what is the name of the office in which the patient attends annual eye exams? Dr. Clydene Laming   Dental Screening: Recommended annual dental exams for proper oral hygiene  Community Resource Referral / Chronic Care Management: CRR required this visit?  No   CCM required this visit?  No      Plan:     I have personally reviewed and noted the following in the patient's chart:   . Medical and social history . Use of alcohol, tobacco or illicit drugs  . Current medications and supplements . Functional ability and status . Nutritional status . Physical activity . Advanced directives . List of other physicians . Hospitalizations, surgeries, and ER visits in previous 12 months . Vitals . Screenings to include cognitive, depression, and falls . Referrals and appointments  In addition, I have reviewed and discussed with patient certain preventive protocols, quality metrics, and best practice recommendations. A written personalized care plan for preventive services as well as general preventive health recommendations were provided to patient.    Due to this being a telephonic visit, the after visit summary with patients personalized plan was offered to patient via mail or my-chart.  Patient would like to access on my-chart.   Marta Antu, LPN   [id number]  Nurse Health Advisor  Nurse Notes: None

## 2020-03-06 NOTE — Patient Instructions (Signed)
Tonya Bowers , Thank you for taking time to come for your Medicare Wellness Visit. I appreciate your ongoing commitment to your health goals. Please review the following plan we discussed and let me know if I can assist you in the future.   Screening recommendations/referrals: Colonoscopy: Completed 10/30/2016-Due 10/31/2026 Mammogram: Per our conversation, completed 03/01/2020-Due 03/01/2021 Please have a copy of results sent to Dr. Ethelene Hal. Bone Density: Per our conversation completed 01/2020-Due 01/2022-Please have a copy of results sent to Dr. Ethelene Hal. Recommended yearly ophthalmology/optometry visit for glaucoma screening and checkup Recommended yearly dental visit for hygiene and checkup  Vaccinations: Influenza vaccine: Due now-Discuss at next office visit or at your local pharmacy. Pneumococcal vaccine: Due-Discuss with Dr. Ethelene Hal at your next office visit. Tdap vaccine: Up to Date-Due-06/10/2026 Shingles vaccine: Discuss with pharmacy  Covid-19:Completed vaccines  Advanced directives: Information mailed today.  Conditions/risks identified: See problem list  Next appointment: Follow up in one year for your annual wellness visit 03/13/21/@ 2:15pm   Preventive Care 65 Years and Older, Female Preventive care refers to lifestyle choices and visits with your health care provider that can promote health and wellness. What does preventive care include?  A yearly physical exam. This is also called an annual well check.  Dental exams once or twice a year.  Routine eye exams. Ask your health care provider how often you should have your eyes checked.  Personal lifestyle choices, including:  Daily care of your teeth and gums.  Regular physical activity.  Eating a healthy diet.  Avoiding tobacco and drug use.  Limiting alcohol use.  Practicing safe sex.  Taking low-dose aspirin every day.  Taking vitamin and mineral supplements as recommended by your health care provider. What happens  during an annual well check? The services and screenings done by your health care provider during your annual well check will depend on your age, overall health, lifestyle risk factors, and family history of disease. Counseling  Your health care provider may ask you questions about your:  Alcohol use.  Tobacco use.  Drug use.  Emotional well-being.  Home and relationship well-being.  Sexual activity.  Eating habits.  History of falls.  Memory and ability to understand (cognition).  Work and work Statistician.  Reproductive health. Screening  You may have the following tests or measurements:  Height, weight, and BMI.  Blood pressure.  Lipid and cholesterol levels. These may be checked every 5 years, or more frequently if you are over 38 years old.  Skin check.  Lung cancer screening. You may have this screening every year starting at age 35 if you have a 30-pack-year history of smoking and currently smoke or have quit within the past 15 years.  Fecal occult blood test (FOBT) of the stool. You may have this test every year starting at age 3.  Flexible sigmoidoscopy or colonoscopy. You may have a sigmoidoscopy every 5 years or a colonoscopy every 10 years starting at age 2.  Hepatitis C blood test.  Hepatitis B blood test.  Sexually transmitted disease (STD) testing.  Diabetes screening. This is done by checking your blood sugar (glucose) after you have not eaten for a while (fasting). You may have this done every 1-3 years.  Bone density scan. This is done to screen for osteoporosis. You may have this done starting at age 27.  Mammogram. This may be done every 1-2 years. Talk to your health care provider about how often you should have regular mammograms. Talk with your health care provider  about your test results, treatment options, and if necessary, the need for more tests. Vaccines  Your health care provider may recommend certain vaccines, such  as:  Influenza vaccine. This is recommended every year.  Tetanus, diphtheria, and acellular pertussis (Tdap, Td) vaccine. You may need a Td booster every 10 years.  Zoster vaccine. You may need this after age 56.  Pneumococcal 13-valent conjugate (PCV13) vaccine. One dose is recommended after age 36.  Pneumococcal polysaccharide (PPSV23) vaccine. One dose is recommended after age 18. Talk to your health care provider about which screenings and vaccines you need and how often you need them. This information is not intended to replace advice given to you by your health care provider. Make sure you discuss any questions you have with your health care provider. Document Released: 07/19/2015 Document Revised: 03/11/2016 Document Reviewed: 04/23/2015 Elsevier Interactive Patient Education  2017 [employer] Prevention in the Home Falls can cause injuries. They can happen to people of all ages. There are many things you can do to make your home safe and to help prevent falls. What can I do on the outside of my home?  Regularly fix the edges of walkways and driveways and fix any cracks.  Remove anything that might make you trip as you walk through a door, such as a raised step or threshold.  Trim any bushes or trees on the path to your home.  Use bright outdoor lighting.  Clear any walking paths of anything that might make someone trip, such as rocks or tools.  Regularly check to see if handrails are loose or broken. Make sure that both sides of any steps have handrails.  Any raised decks and porches should have guardrails on the edges.  Have any leaves, snow, or ice cleared regularly.  Use sand or salt on walking paths during winter.  Clean up any spills in your garage right away. This includes oil or grease spills. What can I do in the bathroom?  Use night lights.  Install grab bars by the toilet and in the tub and shower. Do not use towel bars as grab bars.  Use  non-skid mats or decals in the tub or shower.  If you need to sit down in the shower, use a plastic, non-slip stool.  Keep the floor dry. Clean up any water that spills on the floor as soon as it happens.  Remove soap buildup in the tub or shower regularly.  Attach bath mats securely with double-sided non-slip rug tape.  Do not have throw rugs and other things on the floor that can make you trip. What can I do in the bedroom?  Use night lights.  Make sure that you have a light by your bed that is easy to reach.  Do not use any sheets or blankets that are too big for your bed. They should not hang down onto the floor.  Have a firm chair that has side arms. You can use this for support while you get dressed.  Do not have throw rugs and other things on the floor that can make you trip. What can I do in the kitchen?  Clean up any spills right away.  Avoid walking on wet floors.  Keep items that you use a lot in easy-to-reach places.  If you need to reach something above you, use a strong step stool that has a grab bar.  Keep electrical cords out of the way.  Do not use floor polish or  wax that makes floors slippery. If you must use wax, use non-skid floor wax.  Do not have throw rugs and other things on the floor that can make you trip. What can I do with my stairs?  Do not leave any items on the stairs.  Make sure that there are handrails on both sides of the stairs and use them. Fix handrails that are broken or loose. Make sure that handrails are as long as the stairways.  Check any carpeting to make sure that it is firmly attached to the stairs. Fix any carpet that is loose or worn.  Avoid having throw rugs at the top or bottom of the stairs. If you do have throw rugs, attach them to the floor with carpet tape.  Make sure that you have a light switch at the top of the stairs and the bottom of the stairs. If you do not have them, ask someone to add them for you. What  else can I do to help prevent falls?  Wear shoes that:  Do not have high heels.  Have rubber bottoms.  Are comfortable and fit you well.  Are closed at the toe. Do not wear sandals.  If you use a stepladder:  Make sure that it is fully opened. Do not climb a closed stepladder.  Make sure that both sides of the stepladder are locked into place.  Ask someone to hold it for you, if possible.  Clearly mark and make sure that you can see:  Any grab bars or handrails.  First and last steps.  Where the edge of each step is.  Use tools that help you move around (mobility aids) if they are needed. These include:  Canes.  Walkers.  Scooters.  Crutches.  Turn on the lights when you go into a dark area. Replace any light bulbs as soon as they burn out.  Set up your furniture so you have a clear path. Avoid moving your furniture around.  If any of your floors are uneven, fix them.  If there are any pets around you, be aware of where they are.  Review your medicines with your doctor. Some medicines can make you feel dizzy. This can increase your chance of falling. Ask your doctor what other things that you can do to help prevent falls. This information is not intended to replace advice given to you by your health care provider. Make sure you discuss any questions you have with your health care provider. Document Released: 04/18/2009 Document Revised: 11/28/2015 Document Reviewed: 07/27/2014 Elsevier Interactive Patient Education  2017 [employer].

## 2020-05-17 ENCOUNTER — Telehealth: Payer: Self-pay | Admitting: Family Medicine

## 2020-05-17 NOTE — Telephone Encounter (Signed)
Patient sent MyChart request to schedule Flu shot. Left message to give the office a call back to schedule.

## 2020-05-20 ENCOUNTER — Other Ambulatory Visit: Payer: Self-pay

## 2020-05-21 ENCOUNTER — Ambulatory Visit (INDEPENDENT_AMBULATORY_CARE_PROVIDER_SITE_OTHER): Payer: Medicare Other

## 2020-05-21 DIAGNOSIS — Z23 Encounter for immunization: Secondary | ICD-10-CM | POA: Diagnosis not present

## 2020-05-21 NOTE — Progress Notes (Signed)
After obtaining consent, and per orders of Dr. Ethelene Hal, injection of flu vaccine right deltoid given by Lynda Rainwater. Patient instructed to remain in clinic for 20 minutes afterwards, and to report any adverse reaction to me immediately.

## 2020-07-11 ENCOUNTER — Other Ambulatory Visit: Payer: Self-pay | Admitting: Family Medicine

## 2020-07-11 DIAGNOSIS — Z760 Encounter for issue of repeat prescription: Secondary | ICD-10-CM

## 2020-07-19 DIAGNOSIS — B078 Other viral warts: Secondary | ICD-10-CM | POA: Diagnosis not present

## 2020-08-08 ENCOUNTER — Ambulatory Visit: Payer: Medicare Other | Admitting: Family Medicine

## 2020-09-17 ENCOUNTER — Other Ambulatory Visit: Payer: Self-pay

## 2020-09-17 ENCOUNTER — Ambulatory Visit (INDEPENDENT_AMBULATORY_CARE_PROVIDER_SITE_OTHER): Payer: Medicare Other | Admitting: Family Medicine

## 2020-09-17 ENCOUNTER — Encounter: Payer: Self-pay | Admitting: Family Medicine

## 2020-09-17 VITALS — BP 142/78 | HR 64 | Temp 97.1°F | Ht 64.0 in | Wt 236.2 lb

## 2020-09-17 DIAGNOSIS — J301 Allergic rhinitis due to pollen: Secondary | ICD-10-CM | POA: Diagnosis not present

## 2020-09-17 DIAGNOSIS — K862 Cyst of pancreas: Secondary | ICD-10-CM | POA: Diagnosis not present

## 2020-09-17 DIAGNOSIS — I10 Essential (primary) hypertension: Secondary | ICD-10-CM

## 2020-09-17 DIAGNOSIS — E876 Hypokalemia: Secondary | ICD-10-CM | POA: Diagnosis not present

## 2020-09-17 DIAGNOSIS — Z0000 Encounter for general adult medical examination without abnormal findings: Secondary | ICD-10-CM

## 2020-09-17 LAB — LIPID PANEL
Cholesterol: 200 mg/dL (ref 0–200)
HDL: 48.8 mg/dL (ref >39.00)
LDL Cholesterol: 121 mg/dL — ABNORMAL HIGH (ref 0–99)
NonHDL: 151.27
Total CHOL/HDL Ratio: 4
Triglycerides: 153 mg/dL — ABNORMAL HIGH (ref 0.0–149.0)
VLDL: 30.6 mg/dL (ref 0.0–40.0)

## 2020-09-17 LAB — BASIC METABOLIC PANEL
BUN: 15 mg/dL (ref 6–23)
CO2: 33 mEq/L — ABNORMAL HIGH (ref 19–32)
Calcium: 9.4 mg/dL (ref 8.4–10.5)
Chloride: 98 mEq/L (ref 96–112)
Creatinine, Ser: 0.88 mg/dL (ref 0.40–1.20)
GFR: 67.39 mL/min (ref >60.00)
Glucose, Bld: 96 mg/dL (ref 70–99)
Potassium: 3.4 mEq/L — ABNORMAL LOW (ref 3.5–5.1)
Sodium: 140 mEq/L (ref 135–145)

## 2020-09-17 LAB — CBC
HCT: 38 % (ref 36.0–46.0)
Hemoglobin: 12.8 g/dL (ref 12.0–15.0)
MCHC: 33.6 g/dL (ref 30.0–36.0)
MCV: 82.7 fl (ref 78.0–100.0)
Platelets: 231 10*3/uL (ref 150.0–400.0)
RBC: 4.6 Mil/uL (ref 3.87–5.11)
RDW: 13.8 % (ref 11.5–15.5)
WBC: 9.3 10*3/uL (ref 4.0–10.5)

## 2020-09-17 LAB — URINALYSIS, ROUTINE W REFLEX MICROSCOPIC
Bilirubin Urine: NEGATIVE
Hgb urine dipstick: NEGATIVE
Ketones, ur: NEGATIVE
Leukocytes,Ua: NEGATIVE
Nitrite: NEGATIVE
RBC / HPF: NONE SEEN (ref 0–?)
Specific Gravity, Urine: 1.02 (ref 1.000–1.030)
Total Protein, Urine: NEGATIVE
Urine Glucose: NEGATIVE
Urobilinogen, UA: 0.2 (ref 0.0–1.0)
pH: 6 (ref 5.0–8.0)

## 2020-09-17 LAB — AMYLASE: Amylase: 37 U/L (ref 27–131)

## 2020-09-17 NOTE — Progress Notes (Addendum)
Established Patient Office Visit  Subjective:  Patient ID: Tonya Bowers, female    DOB: [birth year]  Age: 70 y.o. MRN: [id number]  CC:  Chief Complaint  Patient presents with   Follow-up    Follow up on BP and labs, patient fasting. No concerns.     HPI Tonya Bowers presents for follow-up of hypertension treated with "Palladone.  Tolerating the medicine well.  She is supplementing her potassium intake by eating bananas.  Blood pressure at home is running in the 130s over 70 range.  She is exercising on a treadmill at home.  Plan follow-up with GYN in July.  She is fasting today.  She is experiencing postnasal drip with occasional sneeze.  There has been no fever facial pressure or teeth pain.  Claritin has been helping.  This usually starts about this time of year.  She uses Tylenol Sinus at times.  Past Medical History:  Diagnosis Date   Arthritis    Cataract    Diverticulosis    GERD (gastroesophageal reflux disease)    Hyperplastic colon polyp 09/09/06    Past Surgical History:  Procedure Laterality Date   ABDOMINAL HYSTERECTOMY     CHOLECYSTECTOMY     COLONOSCOPY     POLYPECTOMY     TUBAL LIGATION      Family History  Problem Relation Age of Onset   Hypertension Mother    Hypertension Father    Kidney disease Brother        kidney transplant    Hypertension Brother    Colon cancer Neg Hx     Social History   Socioeconomic History   Marital status: Married    Spouse name: Not on file   Number of children: Not on file   Years of education: Not on file   Highest education level: Not on file  Occupational History   Occupation: retired  Tobacco Use   Smoking status: Never Smoker   Smokeless tobacco: Never Used  Substance and Sexual Activity   Alcohol use: Yes    Comment: maybe occasionally; twice a year   Drug use: No   Sexual activity: Not on file  Other Topics Concern   Not on file  Social History Narrative   Not on file    Social Determinants of Health   Financial Resource Strain: Low Risk    Difficulty of Paying Living Expenses: Not hard at all  Food Insecurity: No Food Insecurity   Worried About Charity fundraiser in the Last Year: Never true   Ran Out of Food in the Last Year: Never true  Transportation Needs: No Transportation Needs   Lack of Transportation (Medical): No   Lack of Transportation (Non-Medical): No  Physical Activity: Inactive   Days of Exercise per Week: 0 days   Minutes of Exercise per Session: 0 min  Stress: No Stress Concern Present   Feeling of Stress : Not at all  Social Connections: Socially Integrated   Frequency of Communication with Friends and Family: More than three times a week   Frequency of Social Gatherings with Friends and Family: Once a week   Attends Religious Services: 1 to 4 times per year   Active Member of [employer] or Organizations: Yes   Attends Archivist Meetings: 1 to 4 times per year   Marital Status: Married  Human resources officer Violence: Not At Risk   Fear of Current or Ex-Partner: No   Emotionally Abused: No   Physically Abused:  No   Sexually Abused: No    Outpatient Medications Prior to Visit  Medication Sig Dispense Refill   calcium carbonate (OSCAL) 1500 (600 Ca) MG TABS tablet Take 600 mg of elemental calcium by mouth 2 (two) times daily with a meal.     chlorthalidone (HYGROTON) 25 MG tablet TAKE 1 TABLET BY MOUTH  DAILY 90 tablet 3   cholecalciferol (VITAMIN D3) 25 MCG (1000 UNIT) tablet Take 1,000 Units by mouth daily.     estradiol (ESTRACE) 0.5 MG tablet Take 0.5 mg by mouth daily.  2   Multiple Vitamins-Minerals (MULTIVITAMIN WITH MINERALS) tablet Take 1 tablet by mouth daily.     omega-3 acid ethyl esters (LOVAZA) 1 g capsule Take 1 g by mouth daily.     No facility-administered medications prior to visit.    Allergies  Allergen Reactions   Lisinopril Cough   Penicillins     ROS Review of  Systems  Constitutional: Negative.   HENT: Positive for postnasal drip and sneezing.   Eyes: Negative for photophobia and visual disturbance.  Respiratory: Negative for shortness of breath and wheezing.   Cardiovascular: Negative.  Negative for chest pain and palpitations.  Gastrointestinal: Negative.   Endocrine: Negative for polyphagia and polyuria.  Genitourinary: Negative.   Musculoskeletal: Negative for gait problem and joint swelling.  Skin: Negative for color change and pallor.  Allergic/Immunologic: Negative for immunocompromised state.  Neurological: Negative for light-headedness and headaches.  Hematological: Does not bruise/bleed easily.  Psychiatric/Behavioral: Negative.       Objective:    Physical Exam Vitals and nursing note reviewed.  Constitutional:      General: She is not in acute distress.    Appearance: Normal appearance. She is not ill-appearing, toxic-appearing or diaphoretic.  HENT:     Head: Normocephalic and atraumatic.     Right Ear: Tympanic membrane, ear canal and external ear normal.     Left Ear: Tympanic membrane, ear canal and external ear normal.     Mouth/Throat:     Mouth: Mucous membranes are moist.     Pharynx: No oropharyngeal exudate.  Eyes:     General: No scleral icterus.    Extraocular Movements: Extraocular movements intact.     Conjunctiva/sclera: Conjunctivae normal.     Pupils: Pupils are equal, round, and reactive to light.  Cardiovascular:     Rate and Rhythm: Normal rate and regular rhythm.  Pulmonary:     Effort: Pulmonary effort is normal.     Breath sounds: Normal breath sounds. No wheezing or rhonchi.  Abdominal:     General: Bowel sounds are normal.  Musculoskeletal:     Cervical back: No rigidity or tenderness.     Right lower leg: No edema.     Left lower leg: No edema.  Lymphadenopathy:     Cervical: No cervical adenopathy.  Neurological:     Mental Status: She is alert and oriented to person, place, and  time.  Psychiatric:        Mood and Affect: Mood normal.        Behavior: Behavior normal.     BP (!) 142/78    Pulse 64    Temp (!) 97.1 F (36.2 C) (Temporal)    Ht 5\' 4"  (1.626 m)    Wt 236 lb 3.2 oz (107.1 kg)    LMP 07/07/1995    SpO2 98%    BMI 40.54 kg/m  Wt Readings from Last 3 Encounters:  09/17/20 236 lb 3.2 oz (  107.1 kg)  03/06/20 237 lb (107.5 kg)  02/05/20 (!) 237 lb (107.5 kg)     Health Maintenance Due  Topic Date Due   DEXA SCAN  Never done   PNA vac Low Risk Adult (1 of 2 - PCV13) Never done    There are no preventive care reminders to display for this patient.  Lab Results  Component Value Date   TSH 2.18 12/15/2018   Lab Results  Component Value Date   WBC 9.3 09/17/2020   HGB 12.8 09/17/2020   HCT 38.0 09/17/2020   MCV 82.7 09/17/2020   PLT 231.0 09/17/2020   Lab Results  Component Value Date   NA 140 09/17/2020   K 3.4 (L) 09/17/2020   CO2 33 (H) 09/17/2020   GLUCOSE 96 09/17/2020   BUN 15 09/17/2020   CREATININE 0.88 09/17/2020   BILITOT 0.7 11/02/2019   ALKPHOS 62 11/02/2019   AST 16 11/02/2019   ALT 16 11/02/2019   PROT 7.0 11/02/2019   ALBUMIN 3.9 11/02/2019   CALCIUM 9.4 09/17/2020   GFR 67.39 09/17/2020   Lab Results  Component Value Date   CHOL 200 09/17/2020   Lab Results  Component Value Date   HDL 48.80 09/17/2020   Lab Results  Component Value Date   LDLCALC 121 (H) 09/17/2020   Lab Results  Component Value Date   TRIG 153.0 (H) 09/17/2020   Lab Results  Component Value Date   CHOLHDL 4 09/17/2020   Lab Results  Component Value Date   HGBA1C 6.0 11/02/2019      Assessment & Plan:   Problem List Items Addressed This Visit      Cardiovascular and Mediastinum   Essential hypertension - Primary   Relevant Medications   omega-3 acid ethyl esters (LOVAZA) 1 g capsule   potassium chloride SA (KLOR-CON) 20 MEQ tablet   Other Relevant Orders   Basic metabolic panel (Completed)   Urinalysis, Routine w  reflex microscopic (Completed)   CBC (Completed)     Respiratory   Seasonal allergic rhinitis due to pollen     Digestive   Pancreatic cyst   Relevant Orders   Amylase (Completed)     Other   Healthcare maintenance   Relevant Orders   DG Bone Density   Lipid panel (Completed)   Hypokalemia   Relevant Medications   potassium chloride SA (KLOR-CON) 20 MEQ tablet   Other Relevant Orders   Potassium      Meds ordered this encounter  Medications   potassium chloride SA (KLOR-CON) 20 MEQ tablet    Sig: Take 1 tablet (20 mEq total) by mouth daily.    Dispense:  100 tablet    Refill:  1    Follow-up: Return in about 6 months (around 03/20/2021), or follow up in 2 months for recheck of potassium..  Discussed the importance of potassium supplementation with chlorthalidone.  Discussed the importance of more frequent follow-up.  Suggested that she will avoid Tylenol Sinus as it may raise her blood pressure.  Claritin would be fine to use as long as it helps.  Libby Maw, MD

## 2020-09-17 NOTE — Patient Instructions (Signed)
Managing Your Hypertension Hypertension, also called high blood pressure, is when the force of the blood pressing against the walls of the arteries is too strong. Arteries are blood vessels that carry blood from your heart throughout your body. Hypertension forces the heart to work harder to pump blood and may cause the arteries to become narrow or stiff. Understanding blood pressure readings Your personal target blood pressure may vary depending on your medical conditions, your age, and other factors. A blood pressure reading includes a higher number over a lower number. Ideally, your blood pressure should be below 120/80. You should know that:  The first, or top, number is called the systolic pressure. It is a measure of the pressure in your arteries as your heart beats.  The second, or bottom number, is called the diastolic pressure. It is a measure of the pressure in your arteries as the heart relaxes. Blood pressure is classified into four stages. Based on your blood pressure reading, your health care provider may use the following stages to determine what type of treatment you need, if any. Systolic pressure and diastolic pressure are measured in a unit called mmHg. Normal  Systolic pressure: below 120.  Diastolic pressure: below 80. Elevated  Systolic pressure: 120-129.  Diastolic pressure: below 80. Hypertension stage 1  Systolic pressure: 130-139.  Diastolic pressure: 80-89. Hypertension stage 2  Systolic pressure: 140 or above.  Diastolic pressure: 90 or above. How can this condition affect me? Managing your hypertension is an important responsibility. Over time, hypertension can damage the arteries and decrease blood flow to important parts of the body, including the brain, heart, and kidneys. Having untreated or uncontrolled hypertension can lead to:  A heart attack.  A stroke.  A weakened blood vessel (aneurysm).  Heart failure.  Kidney damage.  Eye  damage.  Metabolic syndrome.  Memory and concentration problems.  Vascular dementia. What actions can I take to manage this condition? Hypertension can be managed by making lifestyle changes and possibly by taking medicines. Your health care provider will help you make a plan to bring your blood pressure within a normal range. Nutrition  Eat a diet that is high in fiber and potassium, and low in salt (sodium), added sugar, and fat. An example eating plan is called the Dietary Approaches to Stop Hypertension (DASH) diet. To eat this way: ? Eat plenty of fresh fruits and vegetables. Try to fill one-half of your plate at each meal with fruits and vegetables. ? Eat whole grains, such as whole-wheat pasta, brown rice, or whole-grain bread. Fill about one-fourth of your plate with whole grains. ? Eat low-fat dairy products. ? Avoid fatty cuts of meat, processed or cured meats, and poultry with skin. Fill about one-fourth of your plate with lean proteins such as fish, chicken without skin, beans, eggs, and tofu. ? Avoid pre-made and processed foods. These tend to be higher in sodium, added sugar, and fat.  Reduce your daily sodium intake. Most people with hypertension should eat less than 1,500 mg of sodium a day.   Lifestyle  Work with your health care provider to maintain a healthy body weight or to lose weight. Ask what an ideal weight is for you.  Get at least 30 minutes of exercise that causes your heart to beat faster (aerobic exercise) most days of the week. Activities may include walking, swimming, or biking.  Include exercise to strengthen your muscles (resistance exercise), such as weight lifting, as part of your weekly exercise routine. Try   to do these types of exercises for 30 minutes at least 3 days a week.  Do not use any products that contain nicotine or tobacco, such as cigarettes, e-cigarettes, and chewing tobacco. If you need help quitting, ask your health care  provider.  Control any long-term (chronic) conditions you have, such as high cholesterol or diabetes.  Identify your sources of stress and find ways to manage stress. This may include meditation, deep breathing, or making time for fun activities.   Alcohol use  Do not drink alcohol if: ? Your health care provider tells you not to drink. ? You are pregnant, may be pregnant, or are planning to become pregnant.  If you drink alcohol: ? Limit how much you use to:  0-1 drink a day for women.  0-2 drinks a day for men. ? Be aware of how much alcohol is in your drink. In the [place] one drink equals one 12 oz bottle of beer (355 mL), one 5 oz glass of wine (148 mL), or one 1 oz glass of hard liquor (44 mL). Medicines Your health care provider may prescribe medicine if lifestyle changes are not enough to get your blood pressure under control and if:  Your systolic blood pressure is 130 or higher.  Your diastolic blood pressure is 80 or higher. Take medicines only as told by your health care provider. Follow the directions carefully. Blood pressure medicines must be taken as told by your health care provider. The medicine does not work as well when you skip doses. Skipping doses also puts you at risk for problems. Monitoring Before you monitor your blood pressure:  Do not smoke, drink caffeinated beverages, or exercise within 30 minutes before taking a measurement.  Use the bathroom and empty your bladder (urinate).  Sit quietly for at least 5 minutes before taking measurements. Monitor your blood pressure at home as told by your health care provider. To do this:  Sit with your back straight and supported.  Place your feet flat on the floor. Do not cross your legs.  Support your arm on a flat surface, such as a table. Make sure your upper arm is at heart level.  Each time you measure, take two or three readings one minute apart and record the results. You may also need to have your  blood pressure checked regularly by your health care provider.   General information  Talk with your health care provider about your diet, exercise habits, and other lifestyle factors that may be contributing to hypertension.  Review all the medicines you take with your health care provider because there may be side effects or interactions.  Keep all visits as told by your health care provider. Your health care provider can help you create and adjust your plan for managing your high blood pressure. Where to find more information  National Heart, Lung, and Blood Institute: www.nhlbi.nih.gov  American Heart Association: www.heart.org Contact a health care provider if:  You think you are having a reaction to medicines you have taken.  You have repeated (recurrent) headaches.  You feel dizzy.  You have swelling in your ankles.  You have trouble with your vision. Get help right away if:  You develop a severe headache or confusion.  You have unusual weakness or numbness, or you feel faint.  You have severe pain in your chest or abdomen.  You vomit repeatedly.  You have trouble breathing. These symptoms may represent a serious problem that is an emergency. Do not wait   to see if the symptoms will go away. Get medical help right away. Call your local emergency services (911 in the [place] Do not drive yourself to the hospital. Summary  Hypertension is when the force of blood pumping through your arteries is too strong. If this condition is not controlled, it may put you at risk for serious complications.  Your personal target blood pressure may vary depending on your medical conditions, your age, and other factors. For most people, a normal blood pressure is less than 120/80.  Hypertension is managed by lifestyle changes, medicines, or both.  Lifestyle changes to help manage hypertension include losing weight, eating a healthy, low-sodium diet, exercising more, stopping smoking, and  limiting alcohol. This information is not intended to replace advice given to you by your health care provider. Make sure you discuss any questions you have with your health care provider. Document Revised: 07/28/2019 Document Reviewed: 05/23/2019 Elsevier Patient Education  2021 [employer]  

## 2020-09-18 ENCOUNTER — Encounter: Payer: Self-pay | Admitting: Family Medicine

## 2020-09-18 MED ORDER — POTASSIUM CHLORIDE CRYS ER 20 MEQ PO TBCR: 20.0000 meq | EXTENDED_RELEASE_TABLET | Freq: Every day | ORAL | 1 refills | Status: DC

## 2020-09-18 NOTE — Addendum Note (Signed)
Addended by: Jon Billings on: 09/18/2020 08:05 AM   Modules accepted: Orders

## 2020-09-19 ENCOUNTER — Other Ambulatory Visit (HOSPITAL_BASED_OUTPATIENT_CLINIC_OR_DEPARTMENT_OTHER): Payer: Medicare Other

## 2020-09-19 NOTE — Telephone Encounter (Signed)
Spoke with patient who verbally understood message below.  

## 2020-09-25 ENCOUNTER — Encounter: Payer: Self-pay | Admitting: Family Medicine

## 2020-10-01 ENCOUNTER — Encounter: Payer: Self-pay | Admitting: Family Medicine

## 2021-03-11 ENCOUNTER — Ambulatory Visit: Payer: Medicare Other

## 2021-03-13 ENCOUNTER — Ambulatory Visit: Payer: Medicare Other

## 2021-04-01 ENCOUNTER — Encounter: Payer: Self-pay | Admitting: Family Medicine

## 2021-04-01 ENCOUNTER — Ambulatory Visit (INDEPENDENT_AMBULATORY_CARE_PROVIDER_SITE_OTHER): Payer: Medicare Other | Admitting: Family Medicine

## 2021-04-01 ENCOUNTER — Other Ambulatory Visit: Payer: Self-pay

## 2021-04-01 VITALS — BP 134/84 | HR 69 | Temp 97.0°F | Wt 234.6 lb

## 2021-04-01 DIAGNOSIS — Z91199 Patient's noncompliance with other medical treatment and regimen due to unspecified reason: Secondary | ICD-10-CM | POA: Insufficient documentation

## 2021-04-01 DIAGNOSIS — E876 Hypokalemia: Secondary | ICD-10-CM

## 2021-04-01 DIAGNOSIS — I10 Essential (primary) hypertension: Secondary | ICD-10-CM | POA: Diagnosis not present

## 2021-04-01 DIAGNOSIS — K862 Cyst of pancreas: Secondary | ICD-10-CM | POA: Diagnosis not present

## 2021-04-01 DIAGNOSIS — Z0000 Encounter for general adult medical examination without abnormal findings: Secondary | ICD-10-CM

## 2021-04-01 DIAGNOSIS — Z9119 Patient's noncompliance with other medical treatment and regimen: Secondary | ICD-10-CM | POA: Diagnosis not present

## 2021-04-01 LAB — AMYLASE: Amylase: 32 U/L (ref 27–131)

## 2021-04-01 LAB — BASIC METABOLIC PANEL
BUN: 13 mg/dL (ref 6–23)
CO2: 29 mEq/L (ref 19–32)
Calcium: 9.3 mg/dL (ref 8.4–10.5)
Chloride: 100 mEq/L (ref 96–112)
Creatinine, Ser: 0.87 mg/dL (ref 0.40–1.20)
GFR: 68.07 mL/min (ref >60.00)
Glucose, Bld: 112 mg/dL — ABNORMAL HIGH (ref 70–99)
Potassium: 3.2 mEq/L — ABNORMAL LOW (ref 3.5–5.1)
Sodium: 138 mEq/L (ref 135–145)

## 2021-04-01 MED ORDER — AMLODIPINE BESYLATE 5 MG PO TABS: 5.0000 mg | ORAL_TABLET | Freq: Every day | ORAL | 0 refills | Status: DC

## 2021-04-01 NOTE — Progress Notes (Signed)
Established Patient Office Visit  Subjective:  Patient ID: Tonya Bowers, female    DOB: 10-29-1950  Age: 70 y.o. MRN: [id number]  CC:  Chief Complaint  Patient presents with   Follow-up    6 mo f/u. No main concerns    HPI Tonya Bowers presents for her follow-up hypertension and hypokalemia.  Blood pressure today looks good.  She tells me that at home her systolic pressures been running in the 130s.  She is unable to take potassium supplementation.  Trying to increase potassium in her diet.  We had asked her to return for recheck on her potassium and she was unable to do so.  Tells me that her mammogram is scheduled for tomorrow through her GYN provider.  She has also had a bone density test through that provider.  She was told that because of her age she no longer needs Pap smears.  She will check on this tomorrow.  Past Medical History:  Diagnosis Date   Arthritis    Cataract    Diverticulosis    GERD (gastroesophageal reflux disease)    Hyperplastic colon polyp 09/09/06    Past Surgical History:  Procedure Laterality Date   ABDOMINAL HYSTERECTOMY     CHOLECYSTECTOMY     COLONOSCOPY     POLYPECTOMY     TUBAL LIGATION      Family History  Problem Relation Age of Onset   Hypertension Mother    Hypertension Father    Kidney disease Brother        kidney transplant    Hypertension Brother    Colon cancer Neg Hx     Social History   Socioeconomic History   Marital status: Married    Spouse name: Not on file   Number of children: Not on file   Years of education: Not on file   Highest education level: Not on file  Occupational History   Occupation: retired  Tobacco Use   Smoking status: Never   Smokeless tobacco: Never  Substance and Sexual Activity   Alcohol use: Yes    Comment: maybe occasionally; twice a year   Drug use: No   Sexual activity: Not on file  Other Topics Concern   Not on file  Social History Narrative   Not on file   Social Determinants  of Health   Financial Resource Strain: Not on file  Food Insecurity: Not on file  Transportation Needs: Not on file  Physical Activity: Not on file  Stress: Not on file  Social Connections: Not on file  Intimate Partner Violence: Not on file    Outpatient Medications Prior to Visit  Medication Sig Dispense Refill   cholecalciferol (VITAMIN D3) 25 MCG (1000 UNIT) tablet Take 1,000 Units by mouth daily.     estradiol (ESTRACE) 0.5 MG tablet Take 0.5 mg by mouth daily.  2   Multiple Vitamins-Minerals (MULTIVITAMIN WITH MINERALS) tablet Take 1 tablet by mouth daily.     calcium carbonate (OSCAL) 1500 (600 Ca) MG TABS tablet Take 600 mg of elemental calcium by mouth 2 (two) times daily with a meal.     omega-3 acid ethyl esters (LOVAZA) 1 g capsule Take 1 g by mouth daily. (Patient not taking: Reported on 04/01/2021)     potassium chloride SA (KLOR-CON) 20 MEQ tablet Take 1 tablet (20 mEq total) by mouth daily. (Patient not taking: Reported on 04/01/2021) 100 tablet 1   chlorthalidone (HYGROTON) 25 MG tablet TAKE 1 TABLET BY MOUTH  DAILY 90 tablet 3   No facility-administered medications prior to visit.    Allergies  Allergen Reactions   Lisinopril Cough   Penicillins     ROS Review of Systems  Constitutional: Negative.   Respiratory: Negative.    Cardiovascular: Negative.   Gastrointestinal: Negative.   Musculoskeletal:  Negative for gait problem and joint swelling.  Neurological:  Negative for speech difficulty and weakness.     Objective:    Physical Exam Vitals and nursing note reviewed.  Constitutional:      General: She is not in acute distress.    Appearance: Normal appearance. She is not ill-appearing, toxic-appearing or diaphoretic.  HENT:     Head: Normocephalic and atraumatic.     Right Ear: Tympanic membrane, ear canal and external ear normal. There is no impacted cerumen.     Left Ear: Tympanic membrane, ear canal and external ear normal. There is no impacted  cerumen.     Mouth/Throat:     Mouth: Mucous membranes are moist.     Pharynx: Oropharynx is clear. No oropharyngeal exudate or posterior oropharyngeal erythema.  Eyes:     General: No scleral icterus.       Right eye: No discharge.        Left eye: No discharge.     Extraocular Movements: Extraocular movements intact.     Conjunctiva/sclera: Conjunctivae normal.     Pupils: Pupils are equal, round, and reactive to light.  Neck:     Vascular: No carotid bruit.  Cardiovascular:     Rate and Rhythm: Normal rate and regular rhythm.  Pulmonary:     Effort: Pulmonary effort is normal.     Breath sounds: Normal breath sounds.  Musculoskeletal:     Cervical back: No rigidity or tenderness.     Right lower leg: No edema.     Left lower leg: No edema.  Lymphadenopathy:     Cervical: No cervical adenopathy.  Neurological:     Mental Status: She is alert and oriented to person, place, and time.  Psychiatric:        Mood and Affect: Mood normal.        Behavior: Behavior normal.    BP 134/84 (BP Location: Left Arm, Patient Position: Sitting, Cuff Size: Large)   Pulse 69   Temp (!) 97 F (36.1 C) (Temporal)   Wt 234 lb 9.6 oz (106.4 kg)   LMP 07/07/1995   SpO2 96%   BMI 40.27 kg/m  Wt Readings from Last 3 Encounters:  04/01/21 234 lb 9.6 oz (106.4 kg)  09/17/20 236 lb 3.2 oz (107.1 kg)  03/06/20 237 lb (107.5 kg)     Health Maintenance Due  Topic Date Due   Zoster Vaccines- Shingrix (1 of 2) Never done   DEXA SCAN  Never done   COVID-19 Vaccine (4 - Booster for Pfizer series) 08/21/2020   INFLUENZA VACCINE  02/03/2021   MAMMOGRAM  03/01/2021    There are no preventive care reminders to display for this patient.  Lab Results  Component Value Date   TSH 2.18 12/15/2018   Lab Results  Component Value Date   WBC 9.3 09/17/2020   HGB 12.8 09/17/2020   HCT 38.0 09/17/2020   MCV 82.7 09/17/2020   PLT 231.0 09/17/2020   Lab Results  Component Value Date   NA 140  09/17/2020   K 3.4 (L) 09/17/2020   CO2 33 (H) 09/17/2020   GLUCOSE 96 09/17/2020   BUN 15 09/17/2020  CREATININE 0.88 09/17/2020   BILITOT 0.7 11/02/2019   ALKPHOS 62 11/02/2019   AST 16 11/02/2019   ALT 16 11/02/2019   PROT 7.0 11/02/2019   ALBUMIN 3.9 11/02/2019   CALCIUM 9.4 09/17/2020   GFR 67.39 09/17/2020   Lab Results  Component Value Date   CHOL 200 09/17/2020   Lab Results  Component Value Date   HDL 48.80 09/17/2020   Lab Results  Component Value Date   LDLCALC 121 (H) 09/17/2020   Lab Results  Component Value Date   TRIG 153.0 (H) 09/17/2020   Lab Results  Component Value Date   CHOLHDL 4 09/17/2020   Lab Results  Component Value Date   HGBA1C 6.0 11/02/2019      Assessment & Plan:   Problem List Items Addressed This Visit       Cardiovascular and Mediastinum   Essential hypertension - Primary   Relevant Medications   amLODipine (NORVASC) 5 MG tablet   Other Relevant Orders   Basic metabolic panel     Digestive   Cyst of pancreas   Relevant Orders   Amylase     Other   Healthcare maintenance   Hypokalemia   Relevant Orders   Basic metabolic panel   Medically noncompliant    Meds ordered this encounter  Medications   amLODipine (NORVASC) 5 MG tablet    Sig: Take 1 tablet (5 mg total) by mouth daily.    Dispense:  90 tablet    Refill:  0     Follow-up: Return in about 3 months (around 07/01/2021), or Check and record blood pressures..  Patient is unable to take potassium supplementation.  We will discontinue chlorthalidone and start amlodipine 5 mg daily.  I explained to her that it was too dangerous to be taking a diuretic and not have close follow-up of her potassium.  Asked her to check and record her blood pressures on occasion.  Be sure to follow-up in 3 months.  Libby Maw, MD

## 2021-04-01 NOTE — Progress Notes (Signed)
The potassium is a little bit lower.  As directed stop chlorthalidone and start amlodipine.  Please consume a potassium rich diet.  If you need instruction for this let us know.  As we discussed during your visit, hypokalemia can be an effect of chlorthalidone.

## 2021-04-02 DIAGNOSIS — Z1231 Encounter for screening mammogram for malignant neoplasm of breast: Secondary | ICD-10-CM | POA: Diagnosis not present

## 2021-04-08 ENCOUNTER — Ambulatory Visit (INDEPENDENT_AMBULATORY_CARE_PROVIDER_SITE_OTHER): Payer: Medicare Other | Admitting: *Deleted

## 2021-04-08 DIAGNOSIS — Z0000 Encounter for general adult medical examination without abnormal findings: Secondary | ICD-10-CM | POA: Diagnosis not present

## 2021-04-08 NOTE — Progress Notes (Signed)
Subjective:   Tonya Bowers is a 70 y.o. female who presents for Medicare Annual (Subsequent) preventive examination.  I connected with  [name] on 04/08/21 by a telephone enabled telemedicine application and verified that I am speaking with the correct person using two identifiers.   I discussed the limitations of evaluation and management by telemedicine. The patient expressed understanding and agreed to proceed.   Review of Systems     Cardiac Risk Factors include: advanced age (>37men, >38 women);hypertension;obesity (BMI >30kg/m2)     Objective:    Today's Vitals   There is no height or weight on file to calculate BMI.  Advanced Directives 04/08/2021 03/06/2020 10/30/2016 10/16/2016  Does Patient Have a Medical Advance Directive? No No No No  Would patient like information on creating a medical advance directive? No - Patient declined Yes (MAU/Ambulatory/Procedural Areas - Information given) - -    Current Medications (verified) Outpatient Encounter Medications as of 04/08/2021  Medication Sig   amLODipine (NORVASC) 5 MG tablet Take 1 tablet (5 mg total) by mouth daily.   calcium carbonate (OSCAL) 1500 (600 Ca) MG TABS tablet Take 600 mg of elemental calcium by mouth 2 (two) times daily with a meal.   cholecalciferol (VITAMIN D3) 25 MCG (1000 UNIT) tablet Take 1,000 Units by mouth daily.   estradiol (ESTRACE) 0.5 MG tablet Take 0.5 mg by mouth daily.   Multiple Vitamins-Minerals (MULTIVITAMIN WITH MINERALS) tablet Take 1 tablet by mouth daily.   omega-3 acid ethyl esters (LOVAZA) 1 g capsule Take 1 g by mouth daily. (Patient not taking: No sig reported)   potassium chloride SA (KLOR-CON) 20 MEQ tablet Take 1 tablet (20 mEq total) by mouth daily. (Patient not taking: No sig reported)   No facility-administered encounter medications on file as of 04/08/2021.    Allergies (verified) Lisinopril and Penicillins   History: Past Medical History:  Diagnosis Date   Arthritis     Cataract    Diverticulosis    GERD (gastroesophageal reflux disease)    Hyperplastic colon polyp 09/09/06   Past Surgical History:  Procedure Laterality Date   ABDOMINAL HYSTERECTOMY     CHOLECYSTECTOMY     COLONOSCOPY     POLYPECTOMY     TUBAL LIGATION     Family History  Problem Relation Age of Onset   Hypertension Mother    Hypertension Father    Kidney disease Brother        kidney transplant    Hypertension Brother    Colon cancer Neg Hx    Social History   Socioeconomic History   Marital status: Married    Spouse name: Not on file   Number of children: Not on file   Years of education: Not on file   Highest education level: Not on file  Occupational History   Occupation: retired  Tobacco Use   Smoking status: Never   Smokeless tobacco: Never  Vaping Use   Vaping Use: Never used  Substance and Sexual Activity   Alcohol use: Yes    Comment: maybe occasionally; twice a year   Drug use: No   Sexual activity: Not on file  Other Topics Concern   Not on file  Social History Narrative   Not on file   Social Determinants of Health   Financial Resource Strain: Low Risk    Difficulty of Paying Living Expenses: Not hard at all  Food Insecurity: No Food Insecurity   Worried About Charity fundraiser in  the Last Year: Never true   Ran Out of Food in the Last Year: Never true  Transportation Needs: No Transportation Needs   Lack of Transportation (Medical): No   Lack of Transportation (Non-Medical): No  Physical Activity: Insufficiently Active   Days of Exercise per Week: 3 days   Minutes of Exercise per Session: 20 min  Stress: No Stress Concern Present   Feeling of Stress : Not at all  Social Connections: Socially Integrated   Frequency of Communication with Friends and Family: Three times a week   Frequency of Social Gatherings with Friends and Family: Twice a week   Attends Religious Services: More than 4 times per year   Active Member of [employer] or  Organizations: Yes   Attends Music therapist: More than 4 times per year   Marital Status: Married    Tobacco Counseling Counseling given: Not Answered   Clinical Intake:  Pre-visit preparation completed: Yes  Pain : No/denies pain     Nutritional Risks: None Diabetes: No  How often do you need to have someone help you when you read instructions, pamphlets, or other written materials from your doctor or pharmacy?: 1 - Never  Diabetic?no  Interpreter Needed?: No  Information entered by :: Leroy Kennedy LPN   Activities of Daily Living In your present state of health, do you have any difficulty performing the following activities: 04/08/2021  Hearing? N  Vision? N  Difficulty concentrating or making decisions? N  Walking or climbing stairs? N  Dressing or bathing? N  Doing errands, shopping? N  Preparing Food and eating ? N  Using the Toilet? N  In the past six months, have you accidently leaked urine? N  Do you have problems with loss of bowel control? N  Managing your Medications? N  Managing your Finances? N  Housekeeping or managing your Housekeeping? N  Some recent data might be hidden    Patient Care Team: Libby Maw, MD as PCP - General (Family Medicine)  Indicate any recent Medical Services you may have received from other than [facility] providers in the past year (date may be approximate).     Assessment:   This is a routine wellness examination for Tonya Bowers.  Hearing/Vision screen Hearing Screening - Comments:: No hearing issues Vision Screening - Comments:: Up to date Dr. Clydene Laming  Dietary issues and exercise activities discussed: Current Exercise Habits: Home exercise routine, Type of exercise: walking;treadmill, Time (Minutes): 30, Frequency (Times/Week): 4, Weekly Exercise (Minutes/Week): 120, Intensity: Mild   Goals Addressed             This Visit's Progress    Patient Stated   On track    Eating healthier, drinking more  water  & being more active     Patient Stated       Would like to get under 200 lb       Depression Screen PHQ 2/9 Scores 04/08/2021 09/17/2020 03/06/2020 11/02/2019 03/25/2015  PHQ - 2 Score 0 0 0 0 0    Fall Risk Fall Risk  04/08/2021 09/17/2020 03/06/2020 11/02/2019  Falls in the past year? 0 0 0 0  Number falls in past yr: 0 - 0 -  Injury with Fall? 0 - 0 -  Follow up Falls evaluation completed;Falls prevention discussed - Falls prevention discussed -    FALL RISK PREVENTION PERTAINING TO THE HOME:  Any stairs in or around the home? Yes  If so, are there any without handrails? No  Home free of loose throw rugs in walkways, pet beds, electrical cords, etc? Yes  Adequate lighting in your home to reduce risk of falls? Yes   ASSISTIVE DEVICES UTILIZED TO PREVENT FALLS:  Life alert? No  Use of a cane, walker or w/c? No  Grab bars in the bathroom? Yes  Shower chair or bench in shower? No  Elevated toilet seat or a handicapped toilet? Yes   TIMED UP AND GO:  Was the test performed? No .   Cognitive Function: Normal cognitive status assessed by direct observation by this Nurse Health Advisor. No abnormalities found.          Immunizations Immunization History  Administered Date(s) Administered   Fluad Quad(high Dose 65+) 04/12/2019, 05/21/2020   Influenza-Unspecified 05/03/2015, 04/11/2016, 04/08/2018   PFIZER(Purple Top)SARS-COV-2 Vaccination 08/11/2019, 09/05/2019, 04/20/2020   Tdap 06/10/2016    TDAP status: Up to date  Flu Vaccine status: Due, Education has been provided regarding the importance of this vaccine. Advised may receive this vaccine at local pharmacy or Health Dept. Aware to provide a copy of the vaccination record if obtained from local pharmacy or Health Dept. Verbalized acceptance and understanding.  Pneumococcal vaccine status: Due, Education has been provided regarding the importance of this vaccine. Advised may receive this vaccine at local pharmacy  or Health Dept. Aware to provide a copy of the vaccination record if obtained from local pharmacy or Health Dept. Verbalized acceptance and understanding.  Covid-19 vaccine status: Completed vaccines  Qualifies for Shingles Vaccine? Yes   Zostavax completed No   Shingrix Completed?: No.    Education has been provided regarding the importance of this vaccine. Patient has been advised to call insurance company to determine out of pocket expense if they have not yet received this vaccine. Advised may also receive vaccine at local pharmacy or Health Dept. Verbalized acceptance and understanding.  Screening Tests Health Maintenance  Topic Date Due   INFLUENZA VACCINE  02/03/2021   Zoster Vaccines- Shingrix (1 of 2) 07/09/2021 (Originally 01/03/2002)   MAMMOGRAM  04/01/2022   TETANUS/TDAP  06/10/2026   COLONOSCOPY (Pts 45-30yrs Insurance coverage will need to be confirmed)  10/31/2026   DEXA SCAN  Completed   COVID-19 Vaccine  Completed   Hepatitis C Screening  Completed   HPV VACCINES  Aged Out    Health Maintenance  Health Maintenance Due  Topic Date Due   INFLUENZA VACCINE  02/03/2021    Colorectal cancer screening: Type of screening: Colonoscopy. Completed 2018. Repeat every 10 years  Mammogram status: Completed  . Repeat every year  Bone Density status: Completed  . Results reflect: Bone density results: OSTEOPOROSIS. Repeat every 2 years.  Lung Cancer Screening: (Low Dose CT Chest recommended if Age 55-80 years, 30 pack-year currently smoking OR have quit w/in 15years.) does not qualify.   Lung Cancer Screening Referral:   Additional Screening:  Hepatitis C Screening: does qualify  Vision Screening: Recommended annual ophthalmology exams for early detection of glaucoma and other disorders of the eye. Is the patient up to date with their annual eye exam?  Yes  Who is the provider or what is the name of the office in which the patient attends annual eye exams? Dr. Clydene Laming If pt  is not established with a provider, would they like to be referred to a provider to establish care? No .   Dental Screening: Recommended annual dental exams for proper oral hygiene  Community Resource Referral / Chronic Care Management: CRR required this visit?  No   CCM required this visit?  No      Plan:     I have personally reviewed and noted the following in the patient's chart:   Medical and social history Use of alcohol, tobacco or illicit drugs  Current medications and supplements including opioid prescriptions.  Functional ability and status Nutritional status Physical activity Advanced directives List of other physicians Hospitalizations, surgeries, and ER visits in previous 12 months Vitals Screenings to include cognitive, depression, and falls Referrals and appointments  In addition, I have reviewed and discussed with patient certain preventive protocols, quality metrics, and best practice recommendations. A written personalized care plan for preventive services as well as general preventive health recommendations were provided to patient.     Leroy Kennedy, LPN   [id number]/08/[id number]   Nurse Notes:

## 2021-04-08 NOTE — Patient Instructions (Signed)
[name] , Thank you for taking time to come for your Medicare Wellness Visit. I appreciate your ongoing commitment to your health goals. Please review the following plan we discussed and let me know if I can assist you in the future.   Screening recommendations/referrals: Colonoscopy: up to date Mammogram: up to date Bone Density: up to date Recommended yearly ophthalmology/optometry visit for glaucoma screening and checkup Recommended yearly dental visit for hygiene and checkup  Vaccinations: Influenza vaccine:Education provided Pneumococcal vaccine: Education provided Tdap vaccine: education provided Shingles vaccine: Education provided    Advanced directives: Education provided  Conditions/risks identified:   Next appointment: 07-03-2021 @ 10:00 Dr. Ethelene Hal   Preventive Care 65 Years and Older, Female Preventive care refers to lifestyle choices and visits with your health care provider that can promote health and wellness. What does preventive care include? A yearly physical exam. This is also called an annual well check. Dental exams once or twice a year. Routine eye exams. Ask your health care provider how often you should have your eyes checked. Personal lifestyle choices, including: Daily care of your teeth and gums. Regular physical activity. Eating a healthy diet. Avoiding tobacco and drug use. Limiting alcohol use. Practicing safe sex. Taking low-dose aspirin every day. Taking vitamin and mineral supplements as recommended by your health care provider. What happens during an annual well check? The services and screenings done by your health care provider during your annual well check will depend on your age, overall health, lifestyle risk factors, and family history of disease. Counseling  Your health care provider may ask you questions about your: Alcohol use. Tobacco use. Drug use. Emotional well-being. Home and relationship well-being. Sexual  activity. Eating habits. History of falls. Memory and ability to understand (cognition). Work and work Statistician. Reproductive health. Screening  You may have the following tests or measurements: Height, weight, and BMI. Blood pressure. Lipid and cholesterol levels. These may be checked every 5 years, or more frequently if you are over 4 years old. Skin check. Lung cancer screening. You may have this screening every year starting at age 11 if you have a 30-pack-year history of smoking and currently smoke or have quit within the past 15 years. Fecal occult blood test (FOBT) of the stool. You may have this test every year starting at age 29. Flexible sigmoidoscopy or colonoscopy. You may have a sigmoidoscopy every 5 years or a colonoscopy every 10 years starting at age 39. Hepatitis C blood test. Hepatitis B blood test. Sexually transmitted disease (STD) testing. Diabetes screening. This is done by checking your blood sugar (glucose) after you have not eaten for a while (fasting). You may have this done every 1-3 years. Bone density scan. This is done to screen for osteoporosis. You may have this done starting at age 62. Mammogram. This may be done every 1-2 years. Talk to your health care provider about how often you should have regular mammograms. Talk with your health care provider about your test results, treatment options, and if necessary, the need for more tests. Vaccines  Your health care provider may recommend certain vaccines, such as: Influenza vaccine. This is recommended every year. Tetanus, diphtheria, and acellular pertussis (Tdap, Td) vaccine. You may need a Td booster every 10 years. Zoster vaccine. You may need this after age 9. Pneumococcal 13-valent conjugate (PCV13) vaccine. One dose is recommended after age 43. Pneumococcal polysaccharide (PPSV23) vaccine. One dose is recommended after age 73. Talk to your health care provider about which screenings  and vaccines  you need and how often you need them. This information is not intended to replace advice given to you by your health care provider. Make sure you discuss any questions you have with your health care provider. Document Released: 07/19/2015 Document Revised: 03/11/2016 Document Reviewed: 04/23/2015 Elsevier Interactive Patient Education  2017 [employer] Prevention in the Home Falls can cause injuries. They can happen to people of all ages. There are many things you can do to make your home safe and to help prevent falls. What can I do on the outside of my home? Regularly fix the edges of walkways and driveways and fix any cracks. Remove anything that might make you trip as you walk through a door, such as a raised step or threshold. Trim any bushes or trees on the path to your home. Use bright outdoor lighting. Clear any walking paths of anything that might make someone trip, such as rocks or tools. Regularly check to see if handrails are loose or broken. Make sure that both sides of any steps have handrails. Any raised decks and porches should have guardrails on the edges. Have any leaves, snow, or ice cleared regularly. Use sand or salt on walking paths during winter. Clean up any spills in your garage right away. This includes oil or grease spills. What can I do in the bathroom? Use night lights. Install grab bars by the toilet and in the tub and shower. Do not use towel bars as grab bars. Use non-skid mats or decals in the tub or shower. If you need to sit down in the shower, use a plastic, non-slip stool. Keep the floor dry. Clean up any water that spills on the floor as soon as it happens. Remove soap buildup in the tub or shower regularly. Attach bath mats securely with double-sided non-slip rug tape. Do not have throw rugs and other things on the floor that can make you trip. What can I do in the bedroom? Use night lights. Make sure that you have a light by your bed that  is easy to reach. Do not use any sheets or blankets that are too big for your bed. They should not hang down onto the floor. Have a firm chair that has side arms. You can use this for support while you get dressed. Do not have throw rugs and other things on the floor that can make you trip. What can I do in the kitchen? Clean up any spills right away. Avoid walking on wet floors. Keep items that you use a lot in easy-to-reach places. If you need to reach something above you, use a strong step stool that has a grab bar. Keep electrical cords out of the way. Do not use floor polish or wax that makes floors slippery. If you must use wax, use non-skid floor wax. Do not have throw rugs and other things on the floor that can make you trip. What can I do with my stairs? Do not leave any items on the stairs. Make sure that there are handrails on both sides of the stairs and use them. Fix handrails that are broken or loose. Make sure that handrails are as long as the stairways. Check any carpeting to make sure that it is firmly attached to the stairs. Fix any carpet that is loose or worn. Avoid having throw rugs at the top or bottom of the stairs. If you do have throw rugs, attach them to the floor with carpet tape.  Make sure that you have a light switch at the top of the stairs and the bottom of the stairs. If you do not have them, ask someone to add them for you. What else can I do to help prevent falls? Wear shoes that: Do not have high heels. Have rubber bottoms. Are comfortable and fit you well. Are closed at the toe. Do not wear sandals. If you use a stepladder: Make sure that it is fully opened. Do not climb a closed stepladder. Make sure that both sides of the stepladder are locked into place. Ask someone to hold it for you, if possible. Clearly mark and make sure that you can see: Any grab bars or handrails. First and last steps. Where the edge of each step is. Use tools that help you  move around (mobility aids) if they are needed. These include: Canes. Walkers. Scooters. Crutches. Turn on the lights when you go into a dark area. Replace any light bulbs as soon as they burn out. Set up your furniture so you have a clear path. Avoid moving your furniture around. If any of your floors are uneven, fix them. If there are any pets around you, be aware of where they are. Review your medicines with your doctor. Some medicines can make you feel dizzy. This can increase your chance of falling. Ask your doctor what other things that you can do to help prevent falls. This information is not intended to replace advice given to you by your health care provider. Make sure you discuss any questions you have with your health care provider. Document Released: 04/18/2009 Document Revised: 11/28/2015 Document Reviewed: 07/27/2014 Elsevier Interactive Patient Education  2017 [employer].

## 2021-04-09 ENCOUNTER — Other Ambulatory Visit: Payer: Self-pay

## 2021-04-09 ENCOUNTER — Ambulatory Visit (INDEPENDENT_AMBULATORY_CARE_PROVIDER_SITE_OTHER): Payer: Medicare Other

## 2021-04-09 DIAGNOSIS — Z23 Encounter for immunization: Secondary | ICD-10-CM | POA: Diagnosis not present

## 2021-04-09 NOTE — Progress Notes (Signed)
After obtaining consent, and per orders of Dr. Ethelene Hal, injection of High dose influenza given by Lynda Rainwater. Patient instructed to remain in clinic for 20 minutes afterwards, and to report any adverse reaction to me immediately.

## 2021-05-14 ENCOUNTER — Other Ambulatory Visit: Payer: Self-pay | Admitting: Family

## 2021-05-14 DIAGNOSIS — Z760 Encounter for issue of repeat prescription: Secondary | ICD-10-CM

## 2021-06-02 ENCOUNTER — Other Ambulatory Visit: Payer: Self-pay | Admitting: Family Medicine

## 2021-06-02 DIAGNOSIS — I10 Essential (primary) hypertension: Secondary | ICD-10-CM

## 2021-07-03 ENCOUNTER — Other Ambulatory Visit: Payer: Self-pay

## 2021-07-03 ENCOUNTER — Ambulatory Visit (INDEPENDENT_AMBULATORY_CARE_PROVIDER_SITE_OTHER): Payer: Medicare Other | Admitting: Family Medicine

## 2021-07-03 ENCOUNTER — Encounter: Payer: Self-pay | Admitting: Family Medicine

## 2021-07-03 VITALS — BP 152/76 | HR 66 | Temp 97.0°F | Ht 64.0 in | Wt 240.4 lb

## 2021-07-03 DIAGNOSIS — E7800 Pure hypercholesterolemia, unspecified: Secondary | ICD-10-CM | POA: Diagnosis not present

## 2021-07-03 DIAGNOSIS — I10 Essential (primary) hypertension: Secondary | ICD-10-CM

## 2021-07-03 DIAGNOSIS — Z23 Encounter for immunization: Secondary | ICD-10-CM | POA: Diagnosis not present

## 2021-07-03 DIAGNOSIS — E876 Hypokalemia: Secondary | ICD-10-CM | POA: Diagnosis not present

## 2021-07-03 DIAGNOSIS — M5416 Radiculopathy, lumbar region: Secondary | ICD-10-CM

## 2021-07-03 LAB — BASIC METABOLIC PANEL
BUN: 11 mg/dL (ref 6–23)
CO2: 30 mEq/L (ref 19–32)
Calcium: 9 mg/dL (ref 8.4–10.5)
Chloride: 103 mEq/L (ref 96–112)
Creatinine, Ser: 0.88 mg/dL (ref 0.40–1.20)
GFR: 67.02 mL/min (ref >60.00)
Glucose, Bld: 97 mg/dL (ref 70–99)
Potassium: 3.9 mEq/L (ref 3.5–5.1)
Sodium: 138 mEq/L (ref 135–145)

## 2021-07-03 MED ORDER — SPIRONOLACTONE 25 MG PO TABS: 25.0000 mg | ORAL_TABLET | Freq: Every day | ORAL | 1 refills | Status: DC

## 2021-07-03 MED ORDER — PRAVASTATIN SODIUM 20 MG PO TABS: 20.0000 mg | ORAL_TABLET | Freq: Every evening | ORAL | 1 refills | Status: DC

## 2021-07-03 NOTE — Progress Notes (Signed)
Established Patient Office Visit  Subjective:  Patient ID: Tonya Bowers, female    DOB: 1950/08/18  Age: 70 y.o. MRN: [id number]  CC:  Chief Complaint  Patient presents with   Follow-up    3 month follow up on BP seems to be having some reactions to blood pressure medication would like this changed. Left side of hip pain.     HPI Tonya Bowers presents for follow-up of hypertension, hypokalemia, elevated ASCVD risk and also complains of left lower back pain associated with pain in her posterior lateral thigh.  Denies injury to her back.  Denies weakness.  No change in bowel or bladder function.  Amlodipine has caused hot flashes.  Given her age, history of hypertension and so forth, ASCVD is elevated 15.7%.  Past Medical History:  Diagnosis Date   Arthritis    Cataract    Diverticulosis    GERD (gastroesophageal reflux disease)    Hyperplastic colon polyp 09/09/06    Past Surgical History:  Procedure Laterality Date   ABDOMINAL HYSTERECTOMY     CHOLECYSTECTOMY     COLONOSCOPY     POLYPECTOMY     TUBAL LIGATION      Family History  Problem Relation Age of Onset   Hypertension Mother    Hypertension Father    Kidney disease Brother        kidney transplant    Hypertension Brother    Colon cancer Neg Hx     Social History   Socioeconomic History   Marital status: Married    Spouse name: Not on file   Number of children: Not on file   Years of education: Not on file   Highest education level: Not on file  Occupational History   Occupation: retired  Tobacco Use   Smoking status: Never   Smokeless tobacco: Never  Vaping Use   Vaping Use: Never used  Substance and Sexual Activity   Alcohol use: Yes    Comment: maybe occasionally; twice a year   Drug use: No   Sexual activity: Not on file  Other Topics Concern   Not on file  Social History Narrative   Not on file   Social Determinants of Health   Financial Resource Strain: Low Risk    Difficulty of  Paying Living Expenses: Not hard at all  Food Insecurity: No Food Insecurity   Worried About Charity fundraiser in the Last Year: Never true   Ran Out of Food in the Last Year: Never true  Transportation Needs: No Transportation Needs   Lack of Transportation (Medical): No   Lack of Transportation (Non-Medical): No  Physical Activity: Insufficiently Active   Days of Exercise per Week: 3 days   Minutes of Exercise per Session: 20 min  Stress: No Stress Concern Present   Feeling of Stress : Not at all  Social Connections: Socially Integrated   Frequency of Communication with Friends and Family: Three times a week   Frequency of Social Gatherings with Friends and Family: Twice a week   Attends Religious Services: More than 4 times per year   Active Member of [employer] or Organizations: Yes   Attends Music therapist: More than 4 times per year   Marital Status: Married  Human resources officer Violence: Not At Risk   Fear of Current or Ex-Partner: No   Emotionally Abused: No   Physically Abused: No   Sexually Abused: No    Outpatient Medications Prior to Visit  Medication Sig Dispense Refill   calcium carbonate (OSCAL) 1500 (600 Ca) MG TABS tablet Take 600 mg of elemental calcium by mouth 2 (two) times daily with a meal.     cholecalciferol (VITAMIN D3) 25 MCG (1000 UNIT) tablet Take 1,000 Units by mouth daily.     estradiol (ESTRACE) 0.5 MG tablet Take 0.5 mg by mouth daily.  2   Multiple Vitamins-Minerals (MULTIVITAMIN WITH MINERALS) tablet Take 1 tablet by mouth daily.     amLODipine (NORVASC) 5 MG tablet TAKE 1 TABLET BY MOUTH  DAILY 90 tablet 0   omega-3 acid ethyl esters (LOVAZA) 1 g capsule Take 1 g by mouth daily. (Patient not taking: Reported on 07/03/2021)     potassium chloride SA (KLOR-CON) 20 MEQ tablet Take 1 tablet (20 mEq total) by mouth daily. (Patient not taking: Reported on 04/01/2021) 100 tablet 1   No facility-administered medications prior to visit.     Allergies  Allergen Reactions   Lisinopril Cough   Penicillins     ROS Review of Systems  Constitutional:  Negative for diaphoresis, fatigue, fever and unexpected weight change.  HENT: Negative.    Eyes:  Negative for photophobia and visual disturbance.  Respiratory: Negative.    Cardiovascular: Negative.   Gastrointestinal: Negative.   Endocrine: Negative for polyphagia and polyuria.  Genitourinary: Negative.   Musculoskeletal:  Positive for back pain.  Neurological:  Negative for weakness and numbness.  Psychiatric/Behavioral: Negative.       Objective:    Physical Exam Vitals and nursing note reviewed.  Constitutional:      General: She is not in acute distress.    Appearance: Normal appearance. She is not ill-appearing, toxic-appearing or diaphoretic.  HENT:     Head: Normocephalic and atraumatic.     Right Ear: External ear normal.     Left Ear: External ear normal.  Eyes:     General: No scleral icterus.       Right eye: No discharge.        Left eye: No discharge.     Extraocular Movements: Extraocular movements intact.     Conjunctiva/sclera: Conjunctivae normal.  Cardiovascular:     Rate and Rhythm: Normal rate and regular rhythm.  Pulmonary:     Effort: Pulmonary effort is normal.     Breath sounds: Normal breath sounds.  Musculoskeletal:     Lumbar back: No tenderness or bony tenderness. Normal range of motion. Negative right straight leg raise test and negative left straight leg raise test.       Back:     Left hip: Decreased range of motion (mild decreased rom.).  Neurological:     Mental Status: She is alert.     Motor: No weakness.    BP (!) 152/76 (BP Location: Right Arm, Patient Position: Sitting, Cuff Size: Large)    Pulse 66    Temp (!) 97 F (36.1 C) (Temporal)    Ht 5\' 4"  (1.626 m)    Wt 240 lb 6.4 oz (109 kg)    LMP 07/07/1995    SpO2 99%    BMI 41.26 kg/m  Wt Readings from Last 3 Encounters:  07/03/21 240 lb 6.4 oz (109 kg)   04/01/21 234 lb 9.6 oz (106.4 kg)  09/17/20 236 lb 3.2 oz (107.1 kg)     Health Maintenance Due  Topic Date Due   COVID-19 Vaccine (5 - Booster for Pfizer series) 01/24/2021    There are no preventive care reminders to display for  this patient.  Lab Results  Component Value Date   TSH 2.18 12/15/2018   Lab Results  Component Value Date   WBC 9.3 09/17/2020   HGB 12.8 09/17/2020   HCT 38.0 09/17/2020   MCV 82.7 09/17/2020   PLT 231.0 09/17/2020   Lab Results  Component Value Date   NA 138 04/01/2021   K 3.2 (L) 04/01/2021   CO2 29 04/01/2021   GLUCOSE 112 (H) 04/01/2021   BUN 13 04/01/2021   CREATININE 0.87 04/01/2021   BILITOT 0.7 11/02/2019   ALKPHOS 62 11/02/2019   AST 16 11/02/2019   ALT 16 11/02/2019   PROT 7.0 11/02/2019   ALBUMIN 3.9 11/02/2019   CALCIUM 9.3 04/01/2021   GFR 68.07 04/01/2021   Lab Results  Component Value Date   CHOL 200 09/17/2020   Lab Results  Component Value Date   HDL 48.80 09/17/2020   Lab Results  Component Value Date   LDLCALC 121 (H) 09/17/2020   Lab Results  Component Value Date   TRIG 153.0 (H) 09/17/2020   Lab Results  Component Value Date   CHOLHDL 4 09/17/2020   Lab Results  Component Value Date   HGBA1C 6.0 11/02/2019   The 10-year ASCVD risk score (Arnett DK, et al., 2019) is: 15.7%   Values used to calculate the score:     Age: 6 years     Sex: Female     Is Non-Hispanic African American: Yes     Diabetic: No     Tobacco smoker: No     Systolic Blood Pressure: 545 mmHg     Is BP treated: Yes     HDL Cholesterol: 48.8 mg/dL     Total Cholesterol: 200 mg/dL    Assessment & Plan:   Problem List Items Addressed This Visit       Cardiovascular and Mediastinum   Essential hypertension - Primary   Relevant Medications   spironolactone (ALDACTONE) 25 MG tablet   pravastatin (PRAVACHOL) 20 MG tablet   Other Relevant Orders   Basic metabolic panel     Nervous and Auditory   Lumbar radiculopathy    Relevant Orders   Ambulatory referral to Sports Medicine     Other   Hypokalemia   Relevant Orders   Basic metabolic panel   Elevated LDL cholesterol level   Relevant Medications   pravastatin (PRAVACHOL) 20 MG tablet   Need for pneumococcal vaccination   Relevant Orders   Pneumococcal conjugate vaccine 20-valent (Prevnar 20) (Completed)    Meds ordered this encounter  Medications   spironolactone (ALDACTONE) 25 MG tablet    Sig: Take 1 tablet (25 mg total) by mouth daily.    Dispense:  90 tablet    Refill:  1   pravastatin (PRAVACHOL) 20 MG tablet    Sig: Take 1 tablet (20 mg total) by mouth at bedtime.    Dispense:  90 tablet    Refill:  1    Follow-up: Return in about 3 months (around 10/01/2021).  Discontinue amlodipine and start Aldactone.  Discussed side effects.  She was given information on this medication.  Agrees to get probably call a try.  She will take it at night.  Information was given about this medication.  She is to let me know if she develops any unusual joint aches.  Agrees to have the Prevnar 20 vaccine today.  Information was given about this as well as the Shingrix vaccine.  Libby Maw, MD

## 2021-07-10 ENCOUNTER — Encounter: Payer: Self-pay | Admitting: Family Medicine

## 2021-07-16 ENCOUNTER — Encounter: Payer: Self-pay | Admitting: Family Medicine

## 2021-07-16 ENCOUNTER — Ambulatory Visit: Payer: Medicare Other | Admitting: Family Medicine

## 2021-07-16 VITALS — BP 140/67 | Ht 65.5 in | Wt 240.0 lb

## 2021-07-16 DIAGNOSIS — M5416 Radiculopathy, lumbar region: Secondary | ICD-10-CM

## 2021-07-16 MED ORDER — MELOXICAM 15 MG PO TABS: 15.0000 mg | ORAL_TABLET | Freq: Every day | ORAL | 1 refills | Status: DC

## 2021-07-16 NOTE — Patient Instructions (Addendum)
You have lumbar radiculopathy (a pinched nerve in your low back). Ok to take tylenol with this. Start meloxicam 15mg  daily with food for pain and inflammation - take for 7 days then as needed.  This should help your shoulder also Stay as active as possible. Physical therapy has been shown to be helpful as well - start this Strengthening of low back muscles, abdominal musculature are key for long term pain relief. If not improving, will consider further imaging (MRI). Follow up with me in 5-6 weeks but see me in a week if this shoulder worsens instead of improves.

## 2021-07-16 NOTE — Progress Notes (Signed)
PCP: Libby Maw, MD  Subjective:   HPI: Patient is a 71 y.o. female here for low back pain.  Patient reports she's had off and on low back pain for a long time. Current pain in low back worse the past 1-2 weeks. Associated with radiation into the left leg to about the knee. No numbness. No bowel/bladder dysfunction. Has only tried extra strength tylenol for this.  Past Medical History:  Diagnosis Date   Arthritis    Cataract    Diverticulosis    GERD (gastroesophageal reflux disease)    Hyperplastic colon polyp 09/09/06    Current Outpatient Medications on File Prior to Visit  Medication Sig Dispense Refill   calcium carbonate (OSCAL) 1500 (600 Ca) MG TABS tablet Take 600 mg of elemental calcium by mouth 2 (two) times daily with a meal.     cholecalciferol (VITAMIN D3) 25 MCG (1000 UNIT) tablet Take 1,000 Units by mouth daily.     estradiol (ESTRACE) 0.5 MG tablet Take 0.5 mg by mouth daily.  2   Multiple Vitamins-Minerals (MULTIVITAMIN WITH MINERALS) tablet Take 1 tablet by mouth daily.     pravastatin (PRAVACHOL) 20 MG tablet Take 1 tablet (20 mg total) by mouth at bedtime. 90 tablet 1   spironolactone (ALDACTONE) 25 MG tablet Take 1 tablet (25 mg total) by mouth daily. 90 tablet 1   No current facility-administered medications on file prior to visit.    Past Surgical History:  Procedure Laterality Date   ABDOMINAL HYSTERECTOMY     CHOLECYSTECTOMY     COLONOSCOPY     POLYPECTOMY     TUBAL LIGATION      Allergies  Allergen Reactions   Lisinopril Cough   Penicillins     BP 140/67    Ht 5' 5.5" (1.664 m)    Wt 240 lb (108.9 kg)    LMP 07/07/1995    BMI 39.33 kg/m   No flowsheet data found.  No flowsheet data found.      Objective:  Physical Exam:  Gen: NAD, comfortable in exam room  Back: No gross deformity, scoliosis. TTP Left low lumbar paraspinal region, buttock.  No midline or bony TTP. FROM. Strength LEs 5/5 all muscle groups.   Trace  MSRs in patellar and achilles tendons, equal bilaterally. Negative SLRs. Sensation intact to light touch bilaterally.   Assessment & Plan:  1. Low back pain - with radiation into left lower extremity consistent with lumbar radiculopathy.  Discussed options - start meloxicam and formal physical therapy.  Let us know how she's doing over the next week but if she does well plan to f/u in 5-6 weeks.  She also noted some right shoulder pain just for past few days - advised if not improving over the next week with relative rest, meloxicam to follow-up for evaluation of this, likely ultrasound.

## 2021-07-28 ENCOUNTER — Encounter: Payer: Self-pay | Admitting: Physical Therapy

## 2021-07-28 ENCOUNTER — Other Ambulatory Visit: Payer: Self-pay

## 2021-07-28 ENCOUNTER — Ambulatory Visit: Payer: Medicare Other | Attending: Family Medicine | Admitting: Physical Therapy

## 2021-07-28 DIAGNOSIS — R262 Difficulty in walking, not elsewhere classified: Secondary | ICD-10-CM | POA: Diagnosis not present

## 2021-07-28 DIAGNOSIS — M5416 Radiculopathy, lumbar region: Secondary | ICD-10-CM | POA: Diagnosis not present

## 2021-07-28 NOTE — Patient Instructions (Signed)
Aquatic Therapy at [facility]-  What to Expect!  Where:   [facility] [address] [facility] [place], [place] [address] Rehab phone [phone]  NOTE:  You will receive an automated phone message reminding you of your appt and it will say the appointment is at the [address] [facility] [address] [facility] clinic.          How to Prepare: Please make sure you drink 8 ounces of water about one hour prior to your pool session A caregiver may attend if needed with the patient to help assist as needed. A caregiver can sit in the pool room on chair. Please arrive IN YOUR SUIT and 15 minutes prior to your appointment - this helps to avoid delays in starting your session. Please make sure to attend to any toileting needs prior to entering the pool Locker rooms for changing are provided.   There is direct access to the pool deck form the locker room.  You can lock your belongings in a locker with lock provided. Once on the pool deck your therapist will ask if you have signed the Patient  Consent and Assignment of Benefits form before beginning treatment Your therapist may take your blood pressure prior to, during and after your session if indicated We usually try and create a home exercise program based on activities we do in the pool.  Please be thinking about who might be able to assist you in the pool should you need to participate in an aquatic home exercise program at the time of discharge if you need assistance.  Some patients do not want to or do not have the ability to participate in an aquatic home program - this is not a barrier in any way to you participating in aquatic therapy as part of your current therapy plan! After Discharge from PT, you can continue using home program at  the [facility]/, there is a drop-in fee for $5 ($45 a month)or for 60 years  or older $4.00 ($40 a month for seniors ) or any local [facility] pool.  Memberships for purchase are  available for gym/pool at [facility]  IT IS VERY IMPORTANT THAT YOUR LAST VISIT BE IN THE CLINIC AT [address] AFTER YOUR LAST AQUATIC VISIT.  PLEASE MAKE SURE THAT YOU HAVE A [address]  APPOINTMENT SCHEDULED.   About the pool: Pool is located approximately 500 FT from the entrance of the building.  Please bring a support person if you need assistance traveling this      distance.   Your therapist will assist you in entering the water; there are two ways to           enter: stairs with railings, and a mechanical lift. Your therapist will determine the most appropriate way for you.  Water temperature is usually between 88-90 degrees  There may be up to 2 other swimmers in the pool at the same time  The pool deck is tile, please wear shoes with good traction if you prefer not to be barefoot.    Contact Info:  For appointment scheduling and cancellations:         Please call the [facility]  PH:[phone]              Aquatic Therapy  Outpatient [facility]       All sessions are 45 minutes                                                    

## 2021-07-28 NOTE — Therapy (Addendum)
OUTPATIENT PHYSICAL THERAPY THORACOLUMBAR EVALUATION   Patient Name: Tonya Bowers MRN: [id number] DOB:[birth year], 71 y.o., female Today's Date: 07/28/2021   PT End of Session - 07/28/21 1547     Visit Number 1    Number of Visits 12    Date for PT Re-Evaluation 09/08/21    PT Start Time 1505    PT Stop Time 1548    PT Time Calculation (min) 43 min    Activity Tolerance Patient tolerated treatment well    Behavior During Therapy [facility] for tasks assessed/performed             Past Medical History:  Diagnosis Date   Arthritis    Cataract    Diverticulosis    GERD (gastroesophageal reflux disease)    Hyperplastic colon polyp 09/09/06   Past Surgical History:  Procedure Laterality Date   ABDOMINAL HYSTERECTOMY     CHOLECYSTECTOMY     COLONOSCOPY     POLYPECTOMY     TUBAL LIGATION     Patient Active Problem List   Diagnosis Date Noted   Elevated LDL cholesterol level 07/03/2021   Need for pneumococcal vaccination 07/03/2021   Lumbar radiculopathy 07/03/2021   Medically noncompliant 04/01/2021   Seasonal allergic rhinitis due to pollen 09/17/2020   Hepatic steatosis 12/22/2019   Elevated glucose 12/22/2019   Cyst of pancreas 12/22/2019   Mid back pain on left side 12/15/2018   Medication refill 12/15/2018   Hypokalemia 12/15/2018   Healthcare maintenance 06/13/2018   Essential hypertension 09/03/2015    PCP: Libby Maw, MD  REFERRING PROVIDER: Dene Gentry, MD  REFERRING DIAG: lumbar radiculopathy  THERAPY DIAG:  Low back pain   ONSET DATE: chronic   SUBJECTIVE:                                                                                                                                                                                           SUBJECTIVE STATEMENT: Patient reports she has had L sided back pain off for a long time. About 4-6 weeks ago she began to have pain in Lt hip and lateral thigh extending to the knee.  She has had  some relief since her appt with Dr. Micheline Chapman.  At that time it was hard to walk on the L side. She denies weakness or sensory changes.  She has difficulty standing for longer periods, walking.  She can do her housework without much difficulty, but does OK with bending and lifting. She sometimes feels a catch in her back.  She feels like she is bent forward when she walks. No imaging thus far or injection.  PERTINENT HISTORY:  HTN  PAIN:  Are you having pain? Yes NPRS scale: 1/10, can be 7/10 at times  Pain location: L side low back Pain orientation: Left and Lower  PAIN TYPE: aching Pain description: intermittent and aching  Aggravating factors: standing, walking, legs extended on mat  Relieving factors: heating pad, takes OTC meds. Lying down   PRECAUTIONS: None  WEIGHT BEARING RESTRICTIONS No  FALLS:  Has patient fallen in last 6 months? No, Number of falls: 0  LIVING ENVIRONMENT: Lives with: lives with their spouse Lives in: House/apartment Stairs: Yes; Internal: 6 +6 steps; on right going up Has following equipment at home: None  OCCUPATION: Retired for [employer].   PLOF: Independent and Leisure: I lead a boring life.  I enjoy being home   PATIENT GOALS I want to be able to have the pain occur less.    OBJECTIVE:   DIAGNOSTIC FINDINGS:  2020:Vertebral body height is well maintained. Mild osteophytic changes are noted. Mild S-shaped scoliosis in the midthoracic spine is noted. No paraspinal mass is seen.   IMPRESSION: Degenerative change and mild scoliosis.    PATIENT SURVEYS:  FOTO 59%   COGNITION:  Overall cognitive status: Within functional limits for tasks assessed     SENSATION:  Light touch: Appears intact  Stereognosis: Appears intact   Hot/Cold: Appears intact  Proprioception: Appears intact  MUSCLE LENGTH: Hamstrings: tight Quads: tight  POSTURE:  Lumbar lordosis, stands with trunk flexed     PALPATION: Pain L side of trunk,  thoracolumbar spine and into lateral sacral border, gluteals L side   LUMBARAROM/PROM  A/PROM A/PROM  07/28/2021  Flexion Mid shin LLE tightness  Extension WFL  Right lateral flexion WFL  Left lateral flexion WFL  Right rotation WFL  Left rotation WFL    (Blank rows = not tested)  LE MMT:  A/PROM Right 07/28/2021 Left 07/28/2021  Hip flexion 4+/5 4+/5  Hip extension 4+/5 4-/5  Hip abduction  3+/5  Hip adduction    Hip internal rotation    Hip external rotation    Knee flexion 5/5 5/5  Knee extension 5/5 5/5  Ankle dorsiflexion    Ankle plantarflexion    Ankle inversion    Ankle eversion     (Blank rows = not tested)  LE AROM  ROM Right 07/28/2021 Left 07/28/2021  Hip flexion    Hip extension    Hip abduction    Hip adduction    Hip internal rotation    Hip external rotation    Knee flexion    Knee extension    Ankle dorsiflexion    Ankle plantarflexion    Ankle inversion    Ankle eversion     (Blank rows = not tested)  LUMBAR SPECIAL TESTS:  Straight leg raise test: Positive  FUNCTIONAL TESTS:  5 times sit to stand: 17.4 sec  GAIT: Distance walked: 100 Assistive device utilized: None Level of assistance: Complete Independence Comments: develops min limp L LE    TODAY'S TREATMENT  HEP review, PT and POC including aquatics Knee to chest, trunk rotation, cat and camel to childs pose , sidelying for LT QL   PATIENT EDUCATION:  Education details: See above   Person educated: Patient Education method: Explanation, Demonstration, Tactile cues, Verbal cues, and Handouts Education comprehension: verbalized understanding, returned demonstration, and needs further education   HOME EXERCISE PROGRAM: Access Code: [email or web address] URL: [email or web address] Date: 07/28/2021 Prepared by: Raeford Razor  Exercises Cat Cow to Child's Pose - 1  x daily - 7 x weekly - 1 sets - 5-10 reps - 10 hold Child's Pose with Sidebending - 1 x daily - 7 x weekly -  1 sets - 5 reps - 30 hold Hooklying Single Knee to Chest Stretch - 2 x daily - 7 x weekly - 1-2 sets - 5 reps - 30 hold Supine Lower Trunk Rotation - 2 x daily - 7 x weekly - 1-2 sets - 10 reps - 10 hold Sidelying Quadratus Lumborum Stretch on Table - 2 x daily - 7 x weekly - 1 sets - 5 reps - 30 hold   ASSESSMENT:  CLINICAL IMPRESSION: Patient is a 71  y.o. female who was seen today for physical therapy evaluation and treatment for lumbar radiculopathy. Objective impairments include Abnormal gait, decreased activity tolerance, decreased balance, decreased mobility, difficulty walking, decreased ROM, decreased strength, increased fascial restrictions, impaired flexibility, postural dysfunction, obesity, and pain. These impairments are limiting patient from community activity and shopping. Personal factors including 1-2 comorbidities: HTN, obesity  are also affecting patient's functional outcome. Patient will benefit from skilled PT to address above impairments and improve overall function.  REHAB POTENTIAL: Excellent  CLINICAL DECISION MAKING: Stable/uncomplicated  EVALUATION COMPLEXITY: Low   GOALS: LONG TERM GOALS:   LTG Name Target Date Goal status  1 Pt will be able to walk, stand for 30 min without L LE pain  Baseline: 09/08/2021 INITIAL  2 Pt will be able to stand/balance  on LLE with improved stability, up to 15 sec Baseline: 09/08/2021 INITIAL  3 Pt will be able to be I with HEP for core and hip strength  Baseline: 09/08/2021 INITIAL  4 Pt will be able to increase L hip strength to 4+/5 for support with gait, stairs  Baseline: 09/08/2021 INITIAL  5 Pt will improve FOTO score by 10% to demo improved functional mobility Baseline: 59% 09/08/2021 INITIAL  PLAN: PT FREQUENCY: 2x/week  PT DURATION: 6 weeks  PLANNED INTERVENTIONS: Therapeutic exercises, Therapeutic activity, Neuro Muscular re-education, Balance training, Gait training, Patient/Family education, Joint mobilization, Dry  Needling, Electrical stimulation, Cryotherapy, Moist heat, and Manual therapy  PLAN FOR NEXT SESSION: Check HEP, pelvic mobility, lower abs, NuStep, manual therapy   Bralyn Folkert 07/28/2021, 7:26 PM   Raeford Razor, PT 07/28/21 7:45 PM Phone: [phone] Fax: [phone]

## 2021-07-31 ENCOUNTER — Ambulatory Visit: Payer: Medicare Other | Admitting: Physical Therapy

## 2021-07-31 ENCOUNTER — Encounter: Payer: Self-pay | Admitting: Physical Therapy

## 2021-07-31 ENCOUNTER — Other Ambulatory Visit: Payer: Self-pay

## 2021-07-31 DIAGNOSIS — M5416 Radiculopathy, lumbar region: Secondary | ICD-10-CM | POA: Diagnosis not present

## 2021-07-31 DIAGNOSIS — R262 Difficulty in walking, not elsewhere classified: Secondary | ICD-10-CM

## 2021-07-31 NOTE — Therapy (Signed)
OUTPATIENT PHYSICAL THERAPY TREATMENT NOTE   Patient Name: Tonya Bowers MRN: [id number] DOB:12-20-1950, 71 y.o., female Today's Date: 07/31/2021  PCP: Libby Maw, MD REFERRING PROVIDER: Libby Maw,*   PT End of Session - 07/31/21 1402     Visit Number 2    Number of Visits 12    Date for PT Re-Evaluation 09/08/21    PT Start Time 1400    PT Stop Time 5631    PT Time Calculation (min) 45 min             Past Medical History:  Diagnosis Date   Arthritis    Cataract    Diverticulosis    GERD (gastroesophageal reflux disease)    Hyperplastic colon polyp 09/09/06   Past Surgical History:  Procedure Laterality Date   ABDOMINAL HYSTERECTOMY     CHOLECYSTECTOMY     COLONOSCOPY     POLYPECTOMY     TUBAL LIGATION     Patient Active Problem List   Diagnosis Date Noted   Elevated LDL cholesterol level 07/03/2021   Need for pneumococcal vaccination 07/03/2021   Lumbar radiculopathy 07/03/2021   Medically noncompliant 04/01/2021   Seasonal allergic rhinitis due to pollen 09/17/2020   Hepatic steatosis 12/22/2019   Elevated glucose 12/22/2019   Cyst of pancreas 12/22/2019   Mid back pain on left side 12/15/2018   Medication refill 12/15/2018   Hypokalemia 12/15/2018   Healthcare maintenance 06/13/2018   Essential hypertension 09/03/2015  PCP: Libby Maw, MD   REFERRING PROVIDER: Dene Gentry, MD   REFERRING DIAG: lumbar radiculopathy  PRECAUTIONS: None   WEIGHT BEARING RESTRICTIONS No  PERTINENT HISTORY:  HTN  ONSET DATE: chronic    Subjective: I did some of the exercises. 3/10 now in left side.    PAIN:  Are you having pain? Yes NPRS scale: 3/10, can be 7/10 at times  Pain location: L side low back Pain orientation: Left and Lower  PAIN TYPE: sore Pain description: intermittent and aching  Aggravating factors: standing, walking, legs extended on mat  Relieving factors: heating pad, takes OTC meds. Lying down    OBJECTIVE:    DIAGNOSTIC FINDINGS:  2020:Vertebral body height is well maintained. Mild osteophytic changes are noted. Mild S-shaped scoliosis in the midthoracic spine is noted. No paraspinal mass is seen.   IMPRESSION: Degenerative change and mild scoliosis.     PATIENT SURVEYS:  FOTO 59%     COGNITION:          Overall cognitive status: Within functional limits for tasks assessed                        SENSATION:          Light touch: Appears intact          Stereognosis: Appears intact           Hot/Cold: Appears intact          Proprioception: Appears intact   MUSCLE LENGTH: Hamstrings: tight Quads: tight   POSTURE:  Lumbar lordosis, stands with trunk flexed                      PALPATION: Pain L side of trunk, thoracolumbar spine and into lateral sacral border, gluteals L side    LUMBARAROM/PROM   A/PROM A/PROM  07/28/2021  Flexion Mid shin LLE tightness  Extension WFL  Right lateral flexion WFL  Left lateral flexion WFL  Right rotation  WFL  Left rotation WFL    (Blank rows = not tested)   LE MMT:   A/PROM Right 07/28/2021 Left 07/28/2021  Hip flexion 4+/5 4+/5  Hip extension 4+/5 4-/5  Hip abduction   3+/5  Hip adduction      Hip internal rotation      Hip external rotation      Knee flexion 5/5 5/5  Knee extension 5/5 5/5  Ankle dorsiflexion      Ankle plantarflexion      Ankle inversion      Ankle eversion       (Blank rows = not tested)   LE AROM   ROM Right 07/28/2021 Left 07/28/2021  Hip flexion      Hip extension      Hip abduction      Hip adduction      Hip internal rotation      Hip external rotation      Knee flexion      Knee extension      Ankle dorsiflexion      Ankle plantarflexion      Ankle inversion      Ankle eversion       (Blank rows = not tested)   LUMBAR SPECIAL TESTS:  Straight leg raise test: Positive   FUNCTIONAL TESTS:  5 times sit to stand: 17.4 sec   GAIT: Distance walked: 100 Assistive device  utilized: None Level of assistance: Complete Independence Comments: develops min limp L LE   Treatment: 07/31/21 Nustep L5 Ue/Le x 5 min Cat/camel Childs pose Open books S/L Ql stretch PPT Bridge QL stretch in doorway Seated childs pose with ball Modalities: HMP x 10 minutes hooklying to lumbar   Initial TREATMENT  HEP review, PT and POC including aquatics Knee to chest, trunk rotation, cat and camel to childs pose , sidelying for LT QL     PATIENT EDUCATION:  Education details: See above          Person educated: Patient Education method: Explanation, Demonstration, Tactile cues, Verbal cues, and Handouts Education comprehension: verbalized understanding, returned demonstration, and needs further education     HOME EXERCISE PROGRAM: Access Code: [email or web address] URL: [email or web address] Date: 07/31/2021 Prepared by: Hessie Diener  Exercises Cat Cow to Child's Pose - 1 x daily - 7 x weekly - 1 sets - 5-10 reps - 10 hold Child's Pose with Sidebending - 1 x daily - 7 x weekly - 1 sets - 5 reps - 30 hold Hooklying Single Knee to Chest Stretch - 2 x daily - 7 x weekly - 1-2 sets - 5 reps - 30 hold Supine Lower Trunk Rotation - 2 x daily - 7 x weekly - 1-2 sets - 10 reps - 10 hold Sidelying Quadratus Lumborum Stretch on Table - 2 x daily - 7 x weekly - 1 sets - 5 reps - 30 hold Pelvic tilt - 2 x daily - 7 x weekly - 2 sets - 10 reps - 5 hold Bridge - 2 x daily - 7 x weekly - 2 sets - 10 reps      ASSESSMENT:   CLINICAL IMPRESSION: Dyneisha reports min compliance with HEP. Continued with HEP review and beginning core strengthening trial of HMP for muscle tension. She was given and updated HEP and scheduled for aquatic therapy.    REHAB POTENTIAL: Excellent   CLINICAL DECISION MAKING: Stable/uncomplicated   EVALUATION COMPLEXITY: Low     GOALS: LONG TERM GOALS:    LTG Name  Target Date Goal status  1 Pt will be able to walk, stand for 30 min without L LE pain   Baseline: 09/08/2021 INITIAL  2 Pt will be able to stand/balance  on LLE with improved stability, up to 15 sec Baseline: 09/08/2021 INITIAL  3 Pt will be able to be I with HEP for core and hip strength  Baseline: 09/08/2021 INITIAL  4 Pt will be able to increase L hip strength to 4+/5 for support with gait, stairs  Baseline: 09/08/2021 INITIAL  5 Pt will improve FOTO score by 10% to demo improved functional mobility Baseline: 59% 09/08/2021 INITIAL  PLAN: PT FREQUENCY: 2x/week   PT DURATION: 6 weeks   PLANNED INTERVENTIONS: Therapeutic exercises, Therapeutic activity, Neuro Muscular re-education, Balance training, Gait training, Patient/Family education, Joint mobilization, Dry Needling, Electrical stimulation, Cryotherapy, Moist heat, and Manual therapy   PLAN FOR NEXT SESSION: Check HEP, pelvic mobility, lower abs, NuStep, manual therapy, aquatics

## 2021-08-06 ENCOUNTER — Ambulatory Visit: Payer: Medicare Other | Attending: Family Medicine | Admitting: Physical Therapy

## 2021-08-06 ENCOUNTER — Encounter: Payer: Self-pay | Admitting: Physical Therapy

## 2021-08-06 ENCOUNTER — Other Ambulatory Visit: Payer: Self-pay

## 2021-08-06 DIAGNOSIS — M5416 Radiculopathy, lumbar region: Secondary | ICD-10-CM

## 2021-08-06 DIAGNOSIS — R262 Difficulty in walking, not elsewhere classified: Secondary | ICD-10-CM | POA: Diagnosis not present

## 2021-08-06 NOTE — Therapy (Signed)
OUTPATIENT PHYSICAL THERAPY TREATMENT NOTE   Patient Name: Tonya Bowers MRN: [id number] DOB:22-Jul-1950, 71 y.o., female Today's Date: 08/06/2021  PCP: Libby Maw, MD REFERRING PROVIDER: Dene Gentry, MD   PT End of Session - 08/06/21 1405     Visit Number 3    Number of Visits 12    Date for PT Re-Evaluation 09/08/21    PT Start Time 1761    PT Stop Time 6073    PT Time Calculation (min) 42 min             Past Medical History:  Diagnosis Date   Arthritis    Cataract    Diverticulosis    GERD (gastroesophageal reflux disease)    Hyperplastic colon polyp 09/09/06   Past Surgical History:  Procedure Laterality Date   ABDOMINAL HYSTERECTOMY     CHOLECYSTECTOMY     COLONOSCOPY     POLYPECTOMY     TUBAL LIGATION     Patient Active Problem List   Diagnosis Date Noted   Elevated LDL cholesterol level 07/03/2021   Need for pneumococcal vaccination 07/03/2021   Lumbar radiculopathy 07/03/2021   Medically noncompliant 04/01/2021   Seasonal allergic rhinitis due to pollen 09/17/2020   Hepatic steatosis 12/22/2019   Elevated glucose 12/22/2019   Cyst of pancreas 12/22/2019   Mid back pain on left side 12/15/2018   Medication refill 12/15/2018   Hypokalemia 12/15/2018   Healthcare maintenance 06/13/2018   Essential hypertension 09/03/2015  PCP: Libby Maw, MD   REFERRING PROVIDER: Dene Gentry, MD   REFERRING DIAG: lumbar radiculopathy  PRECAUTIONS: None   WEIGHT BEARING RESTRICTIONS No  PERTINENT HISTORY:  HTN  ONSET DATE: chronic    Subjective: I did more of the exercises. My pain is only a 1/10 today.    PAIN:  Are you having pain? Yes NPRS scale: 1/10 Pain location: L side low back Pain orientation: Left and Lower  PAIN TYPE: sore Pain description: intermittent and aching  Aggravating factors: standing, walking, legs extended on mat  Relieving factors: heating pad, takes OTC meds. Lying down   OBJECTIVE:     DIAGNOSTIC FINDINGS:  2020:Vertebral body height is well maintained. Mild osteophytic changes are noted. Mild S-shaped scoliosis in the midthoracic spine is noted. No paraspinal mass is seen.   IMPRESSION: Degenerative change and mild scoliosis.     PATIENT SURVEYS:  FOTO 59%     COGNITION:          Overall cognitive status: Within functional limits for tasks assessed                        SENSATION:          Light touch: Appears intact          Stereognosis: Appears intact           Hot/Cold: Appears intact          Proprioception: Appears intact   MUSCLE LENGTH: Hamstrings: tight Quads: tight   POSTURE:  Lumbar lordosis, stands with trunk flexed                      PALPATION: Pain L side of trunk, thoracolumbar spine and into lateral sacral border, gluteals L side    LUMBARAROM/PROM   A/PROM A/PROM  07/28/2021  Flexion Mid shin LLE tightness  Extension WFL  Right lateral flexion WFL  Left lateral flexion WFL  Right rotation [facility]  Left  rotation WFL    (Blank rows = not tested)   LE MMT:   A/PROM Right 07/28/2021 Left 07/28/2021  Hip flexion 4+/5 4+/5  Hip extension 4+/5 4-/5  Hip abduction   3+/5  Hip adduction      Hip internal rotation      Hip external rotation      Knee flexion 5/5 5/5  Knee extension 5/5 5/5  Ankle dorsiflexion      Ankle plantarflexion      Ankle inversion      Ankle eversion       (Blank rows = not tested)   LE AROM   ROM Right 07/28/2021 Left 07/28/2021  Hip flexion      Hip extension      Hip abduction      Hip adduction      Hip internal rotation      Hip external rotation      Knee flexion      Knee extension      Ankle dorsiflexion      Ankle plantarflexion      Ankle inversion      Ankle eversion       (Blank rows = not tested)   LUMBAR SPECIAL TESTS:  Straight leg raise test: Positive   FUNCTIONAL TESTS:  5 times sit to stand: 17.4 sec   GAIT: Distance walked: 100 Assistive device utilized:  None Level of assistance: Complete Independence Comments: develops min limp L LE   [facility] Adult PT Treatment:                                                DATE: 08/06/21 Therapeutic Exercise: Nustep L5 Ue/Le x 5 min Cat/camel Childs pose with laterals  Open books- modified to right rotation due to right shoulder discomfort x 5 each Single knee chest 20 sec x 3 each  PPT x 10 Bridge x15 with initial PPT QL stretch in doorway x 3 each way   Sidelying clam x 15 bilat  Hamstring stretch supine with strap 3 x 30 sec  Modalities: HMP x 10 minutes hooklying to lumbar    Treatment: 07/31/21 Nustep L5 Ue/Le x 5 min Cat/camel Childs pose Open books S/L Ql stretch PPT Bridge QL stretch in doorway Seated childs pose with ball Modalities: HMP x 10 minutes hooklying to lumbar   Initial TREATMENT  HEP review, PT and POC including aquatics Knee to chest, trunk rotation, cat and camel to childs pose , sidelying for LT QL     PATIENT EDUCATION:  Education details: updated HEP         Person educated: Patient Education method: Consulting civil engineer, Demonstration, Corporate treasurer cues, Verbal cues, and Handouts Education comprehension: verbalized understanding, returned demonstration, and needs further education     HOME EXERCISE PROGRAM: Access Code: [email or web address] URL: [email or web address] Date: 07/31/2021 Prepared by: Hessie Diener  Exercises Cat Cow to Child's Pose - 1 x daily - 7 x weekly - 1 sets - 5-10 reps - 10 hold Child's Pose with Sidebending - 1 x daily - 7 x weekly - 1 sets - 5 reps - 30 hold Hooklying Single Knee to Chest Stretch - 2 x daily - 7 x weekly - 1-2 sets - 5 reps - 30 hold Supine Lower Trunk Rotation - 2 x daily - 7 x weekly - 1-2 sets - 10 reps -  10 hold Sidelying Quadratus Lumborum Stretch on Table - 2 x daily - 7 x weekly - 1 sets - 5 reps - 30 hold Pelvic tilt - 2 x daily - 7 x weekly - 2 sets - 10 reps - 5 hold Bridge - 2 x daily - 7 x weekly - 2 sets - 10  reps      ASSESSMENT:   CLINICAL IMPRESSION: Lizza reports improved compliance with HEP. Continued with HEP review and  core strengthening. She notes pain is starting to improve on left low back.   REHAB POTENTIAL: Excellent   CLINICAL DECISION MAKING: Stable/uncomplicated   EVALUATION COMPLEXITY: Low     GOALS: LONG TERM GOALS:    LTG Name Target Date Goal status  1 Pt will be able to walk, stand for 30 min without L LE pain  Baseline: 09/08/2021 INITIAL  2 Pt will be able to stand/balance  on LLE with improved stability, up to 15 sec Baseline: 09/08/2021 INITIAL  3 Pt will be able to be I with HEP for core and hip strength  Baseline: 09/08/2021 INITIAL  4 Pt will be able to increase L hip strength to 4+/5 for support with gait, stairs  Baseline: 09/08/2021 INITIAL  5 Pt will improve FOTO score by 10% to demo improved functional mobility Baseline: 59% 09/08/2021 INITIAL  PLAN: PT FREQUENCY: 2x/week   PT DURATION: 6 weeks   PLANNED INTERVENTIONS: Therapeutic exercises, Therapeutic activity, Neuro Muscular re-education, Balance training, Gait training, Patient/Family education, Joint mobilization, Dry Needling, Electrical stimulation, Cryotherapy, Moist heat, and Manual therapy   PLAN FOR NEXT SESSION: Check HEP, pelvic mobility, lower abs, NuStep, manual therapy, aquatics

## 2021-08-08 ENCOUNTER — Encounter: Payer: Self-pay | Admitting: Physical Therapy

## 2021-08-08 ENCOUNTER — Other Ambulatory Visit: Payer: Self-pay

## 2021-08-08 ENCOUNTER — Ambulatory Visit: Payer: Medicare Other | Admitting: Physical Therapy

## 2021-08-08 DIAGNOSIS — M5416 Radiculopathy, lumbar region: Secondary | ICD-10-CM | POA: Diagnosis not present

## 2021-08-08 DIAGNOSIS — R262 Difficulty in walking, not elsewhere classified: Secondary | ICD-10-CM | POA: Diagnosis not present

## 2021-08-08 NOTE — Therapy (Signed)
OUTPATIENT PHYSICAL THERAPY TREATMENT NOTE   Patient Name: Tonya Bowers MRN: [id number] DOB:March 29, 1951, 71 y.o., female Today's Date: 08/08/2021  PCP: Tonya Maw, MD REFERRING PROVIDER: Libby Bowers,*   PT End of Session - 08/08/21 0933     Visit Number 4    Number of Visits 12    Date for PT Re-Evaluation 09/08/21    PT Start Time 0930    PT Stop Time 4174    PT Time Calculation (min) 45 min             Past Medical History:  Diagnosis Date   Arthritis    Cataract    Diverticulosis    GERD (gastroesophageal reflux disease)    Hyperplastic colon polyp 09/09/06   Past Surgical History:  Procedure Laterality Date   ABDOMINAL HYSTERECTOMY     CHOLECYSTECTOMY     COLONOSCOPY     POLYPECTOMY     TUBAL LIGATION     Patient Active Problem List   Diagnosis Date Noted   Elevated LDL cholesterol level 07/03/2021   Need for pneumococcal vaccination 07/03/2021   Lumbar radiculopathy 07/03/2021   Medically noncompliant 04/01/2021   Seasonal allergic rhinitis due to pollen 09/17/2020   Hepatic steatosis 12/22/2019   Elevated glucose 12/22/2019   Cyst of pancreas 12/22/2019   Mid back pain on left side 12/15/2018   Medication refill 12/15/2018   Hypokalemia 12/15/2018   Healthcare maintenance 06/13/2018   Essential hypertension 09/03/2015  PCP: Tonya Maw, MD   REFERRING PROVIDER: Dene Gentry, MD   REFERRING DIAG: lumbar radiculopathy  PRECAUTIONS: None   WEIGHT BEARING RESTRICTIONS No  PERTINENT HISTORY:  HTN  ONSET DATE: chronic    Subjective: I do some of the exercises every day. Not all of them.     PAIN:  Are you having pain? Yes NPRS scale: 1/10 Pain location: L side low back Pain orientation: Left and Lower  PAIN TYPE: sore Pain description: intermittent and aching  Aggravating factors: standing, walking, legs extended on mat  Relieving factors: heating pad, takes OTC meds. Lying down   OBJECTIVE:     DIAGNOSTIC FINDINGS:  2020:Vertebral body height is well maintained. Mild osteophytic changes are noted. Mild S-shaped scoliosis in the midthoracic spine is noted. No paraspinal mass is seen.   IMPRESSION: Degenerative change and mild scoliosis.     PATIENT SURVEYS:  FOTO 59%     COGNITION:          Overall cognitive status: Within functional limits for tasks assessed                        SENSATION:          Light touch: Appears intact          Stereognosis: Appears intact           Hot/Cold: Appears intact          Proprioception: Appears intact   MUSCLE LENGTH: Hamstrings: tight Quads: tight   POSTURE:  Lumbar lordosis, stands with trunk flexed                      PALPATION: Pain L side of trunk, thoracolumbar spine and into lateral sacral border, gluteals L side    LUMBARAROM/PROM   A/PROM A/PROM  07/28/2021  Flexion Mid shin LLE tightness  Extension WFL  Right lateral flexion WFL  Left lateral flexion WFL  Right rotation [facility]  Left rotation  WFL    (Blank rows = not tested)   LE MMT:   A/PROM Right 07/28/2021 Left 07/28/2021  Hip flexion 4+/5 4+/5  Hip extension 4+/5 4-/5  Hip abduction   3+/5  Hip adduction      Hip internal rotation      Hip external rotation      Knee flexion 5/5 5/5  Knee extension 5/5 5/5  Ankle dorsiflexion      Ankle plantarflexion      Ankle inversion      Ankle eversion       (Blank rows = not tested)   LE AROM   ROM Right 07/28/2021 Left 07/28/2021  Hip flexion      Hip extension      Hip abduction      Hip adduction      Hip internal rotation      Hip external rotation      Knee flexion      Knee extension      Ankle dorsiflexion      Ankle plantarflexion      Ankle inversion      Ankle eversion       (Blank rows = not tested)   LUMBAR SPECIAL TESTS:  Straight leg raise test: Positive   FUNCTIONAL TESTS:  5 times sit to stand: 17.4 sec   GAIT: Distance walked: 100 Assistive device utilized:  None Level of assistance: Complete Independence Comments: develops min limp L LE   [facility] Adult PT Treatment:                                                DATE: 08/08/21 Therapeutic Exercise: Nustep L5 Ue/Le x 5 min Tandem stance Bilat heel raise Slant board stretch x 30 sec  SLS 23 sec R, 28 sec L QL stretch in doorway x 3 each way  Side lying Left QL stretch over rolled pillow Bridge x15 with initial PPT PPT with Bent knee raises Sidelying clam x 15 bilat red band added  Hooklying blue band clam Bridge with blue band clam- added to HEP  Cat/camel Childs pose with laterals     [facility] Adult PT Treatment:                                                DATE: 08/06/21 Therapeutic Exercise: Nustep L5 Ue/Le x 5 min Cat/camel Childs pose with laterals  Open books- modified to right rotation due to right shoulder discomfort x 5 each Single knee chest 20 sec x 3 each  PPT x 10 Bridge x15 with initial PPT QL stretch in doorway x 3 each way   Sidelying clam x 15 bilat  Hamstring stretch supine with strap 3 x 30 sec  Modalities: HMP x 10 minutes hooklying to lumbar    Treatment: 07/31/21 Nustep L5 Ue/Le x 5 min Cat/camel Childs pose Open books S/L Ql stretch PPT Bridge QL stretch in doorway Seated childs pose with ball Modalities: HMP x 10 minutes hooklying to lumbar        PATIENT EDUCATION:  Education details: continue HEP -given blue band for bridge Person educated: Patient Education method: Explanation, Demonstration, Tactile cues, Verbal cues, and Handouts Education comprehension: verbalized understanding, returned  demonstration, and needs further education     HOME EXERCISE PROGRAM: Access Code: [email or web address] URL: [email or web address] Date: 07/31/2021 Prepared by: Hessie Diener  Exercises Cat Cow to Child's Pose - 1 x daily - 7 x weekly - 1 sets - 5-10 reps - 10 hold Child's Pose with Sidebending - 1 x daily - 7 x weekly - 1 sets - 5 reps - 30  hold Hooklying Single Knee to Chest Stretch - 2 x daily - 7 x weekly - 1-2 sets - 5 reps - 30 hold Supine Lower Trunk Rotation - 2 x daily - 7 x weekly - 1-2 sets - 10 reps - 10 hold Sidelying Quadratus Lumborum Stretch on Table - 2 x daily - 7 x weekly - 1 sets - 5 reps - 30 hold Pelvic tilt - 2 x daily - 7 x weekly - 2 sets - 10 reps - 5 hold Bridge - 2 x daily - 7 x weekly - 2 sets - 10 reps      ASSESSMENT:   CLINICAL IMPRESSION: Anacristina reports improved compliance with HEP. Continued with HEP review and added lower abdominal  strengthening as well as balance challenges. Her SLS has improved to 28 sec . LTG# 2 met. She notes pain intensity is overall decreased however has not noticed any functional improvements.    REHAB POTENTIAL: Excellent   CLINICAL DECISION MAKING: Stable/uncomplicated   EVALUATION COMPLEXITY: Low     GOALS: LONG TERM GOALS:    LTG Name Target Date Goal status  1 Pt will be able to walk, stand for 30 min without L LE pain  Baseline: 09/08/2021 INITIAL  2 Pt will be able to stand/balance  on LLE with improved stability, up to 15 sec Baseline: 09/08/2021 08/08/21 Met  3 Pt will be able to be I with HEP for core and hip strength  Baseline: 09/08/2021 INITIAL  4 Pt will be able to increase L hip strength to 4+/5 for support with gait, stairs  Baseline: 09/08/2021 INITIAL  5 Pt will improve FOTO score by 10% to demo improved functional mobility Baseline: 59% 09/08/2021 INITIAL  PLAN: PT FREQUENCY: 2x/week   PT DURATION: 6 weeks   PLANNED INTERVENTIONS: Therapeutic exercises, Therapeutic activity, Neuro Muscular re-education, Balance training, Gait training, Patient/Family education, Joint mobilization, Dry Needling, Electrical stimulation, Cryotherapy, Moist heat, and Manual therapy   PLAN FOR NEXT SESSION: Check HEP-add lower ab strength, pelvic mobility, lower abs, NuStep, manual therapy, aquatics, balance

## 2021-08-12 NOTE — Therapy (Signed)
OUTPATIENT PHYSICAL THERAPY TREATMENT NOTE   Patient Name: Tonya Bowers MRN: [id number] DOB:[birth year], 71 y.o., female Today's Date: 08/13/2021  PCP: Libby Maw, MD REFERRING PROVIDER: Dene Gentry, MD   PT End of Session - 08/13/21 1507     Visit Number 5    Number of Visits 12    Date for PT Re-Evaluation 09/08/21    PT Start Time 1502    PT Stop Time 1545    PT Time Calculation (min) 43 min    Activity Tolerance Patient tolerated treatment well    Behavior During Therapy [facility] for tasks assessed/performed              Past Medical History:  Diagnosis Date   Arthritis    Cataract    Diverticulosis    GERD (gastroesophageal reflux disease)    Hyperplastic colon polyp 09/09/06   Past Surgical History:  Procedure Laterality Date   ABDOMINAL HYSTERECTOMY     CHOLECYSTECTOMY     COLONOSCOPY     POLYPECTOMY     TUBAL LIGATION     Patient Active Problem List   Diagnosis Date Noted   Elevated LDL cholesterol level 07/03/2021   Need for pneumococcal vaccination 07/03/2021   Lumbar radiculopathy 07/03/2021   Medically noncompliant 04/01/2021   Seasonal allergic rhinitis due to pollen 09/17/2020   Hepatic steatosis 12/22/2019   Elevated glucose 12/22/2019   Cyst of pancreas 12/22/2019   Mid back pain on left side 12/15/2018   Medication refill 12/15/2018   Hypokalemia 12/15/2018   Healthcare maintenance 06/13/2018   Essential hypertension 09/03/2015  PCP: Libby Maw, MD   REFERRING PROVIDER: Dene Gentry, MD   REFERRING DIAG: lumbar radiculopathy  PRECAUTIONS: None   WEIGHT BEARING RESTRICTIONS No  PERTINENT HISTORY:  HTN  ONSET DATE: chronic    Subjective: I do some of the exercises every day. Not all of them.     PAIN:  Are you having pain? no NPRS scale: 0/10 Pain location: L side low back Pain orientation: Left and Lower  PAIN TYPE: sore Pain description: intermittent and aching  Aggravating factors: standing,  walking, legs extended on mat  Relieving factors: heating pad, takes OTC meds. Lying down   OBJECTIVE:    DIAGNOSTIC FINDINGS:  2020:Vertebral body height is well maintained. Mild osteophytic changes are noted. Mild S-shaped scoliosis in the midthoracic spine is noted. No paraspinal mass is seen.   IMPRESSION: Degenerative change and mild scoliosis.     PATIENT SURVEYS:  FOTO 59%     COGNITION:          Overall cognitive status: Within functional limits for tasks assessed                        SENSATION:          Light touch: Appears intact          Stereognosis: Appears intact           Hot/Cold: Appears intact          Proprioception: Appears intact   MUSCLE LENGTH: Hamstrings: tight Quads: tight   POSTURE:  Lumbar lordosis, stands with trunk flexed                      PALPATION: Pain L side of trunk, thoracolumbar spine and into lateral sacral border, gluteals L side    LUMBARAROM/PROM   A/PROM A/PROM  07/28/2021  Flexion Mid shin LLE  tightness  Extension WFL  Right lateral flexion WFL  Left lateral flexion WFL  Right rotation [facility]  Left rotation WFL    (Blank rows = not tested)   LE MMT:   A/PROM Right 07/28/2021 Left 07/28/2021  Hip flexion 4+/5 4+/5  Hip extension 4+/5 4-/5  Hip abduction   3+/5  Hip adduction      Hip internal rotation      Hip external rotation      Knee flexion 5/5 5/5  Knee extension 5/5 5/5  Ankle dorsiflexion      Ankle plantarflexion      Ankle inversion      Ankle eversion       (Blank rows = not tested)   LE AROM   ROM Right 07/28/2021 Left 07/28/2021  Hip flexion      Hip extension      Hip abduction      Hip adduction      Hip internal rotation      Hip external rotation      Knee flexion      Knee extension      Ankle dorsiflexion      Ankle plantarflexion      Ankle inversion      Ankle eversion       (Blank rows = not tested)   LUMBAR SPECIAL TESTS:  Straight leg raise test: Positive    FUNCTIONAL TESTS:  5 times sit to stand: 17.4 sec   GAIT: Distance walked: 100 Assistive device utilized: None Level of assistance: Complete Independence Comments: develops min limp L LE   [facility] Adult PT Treatment:                                                DATE: 08-13-21 Aquatic Therapy Aquatic therapy at [address] - therapeutic pool temp 87 degrees Pt enters aquatic pool area for 1st time. Treatment took place in water 3.8 to  4 ft 8 in.feet deep depending upon activity.  Pt entered and exited the pool via stair and handrails independently  Walking forward, backward and side stepping 5 widths each  On edge of pool with bil UE support LE exercises Hip abd/add R/Leach and then using 1 UE support Hip ext/flex with knee straight  pt needing VC and TC for correct execution and sequencing Marching knee/hip 90/90  Marching with added pool noodle for increased resistance  Ham curl R/L  Standing gastroc stretch with great toe flexed at pool wall R and L  Squats  Runners stretch on R and L 30 sec x 2 and then stretch in to runners hamstring stretch 2 x 30 sec R and L Figure 4 squat stretch with UE support for R and L x 60 sec each  Supine abdominal hollowing with VC to keep hips up and feet up with  yellow barbells in UE submerged prone aquatic  plank with barbell submerged for abdominal engagement, difficulty keeping hip elevated Aqua stretch over Left low back and Left hip with decrease in intensity of pain       Pt requires the buoyancy of water for active assisted exercises with buoyancy supported for strengthening and AROM exercises: PT  requires the viscosity of the water for resistance with strengthening exercises Hydrostatic pressure also supports joints by unweighting joint load by at least 50 % in  3-4 feet depth water. 80% in chest to neck deep water. Water will allow for  reduced joint loading through buoyancy to help patient improve posture without  excess stress and pain [facility] Adult PT Treatment:                                                DATE: 08/08/21 Therapeutic Exercise: Nustep L5 Ue/Le x 5 min Tandem stance Bilat heel raise Slant board stretch x 30 sec  SLS 23 sec R, 28 sec L QL stretch in doorway x 3 each way  Side lying Left QL stretch over rolled pillow Bridge x15 with initial PPT PPT with Bent knee raises Sidelying clam x 15 bilat red band added  Hooklying blue band clam Bridge with blue band clam- added to HEP  Cat/camel Childs pose with laterals     [facility] Adult PT Treatment:                                                DATE: 08/06/21 Therapeutic Exercise: Nustep L5 Ue/Le x 5 min Cat/camel Childs pose with laterals  Open books- modified to right rotation due to right shoulder discomfort x 5 each Single knee chest 20 sec x 3 each  PPT x 10 Bridge x15 with initial PPT QL stretch in doorway x 3 each way   Sidelying clam x 15 bilat  Hamstring stretch supine with strap 3 x 30 sec  Modalities: HMP x 10 minutes hooklying to lumbar    Treatment: 07/31/21 Nustep L5 Ue/Le x 5 min Cat/camel Childs pose Open books S/L Ql stretch PPT Bridge QL stretch in doorway Seated childs pose with ball Modalities: HMP x 10 minutes hooklying to lumbar        PATIENT EDUCATION:  Education details: continue HEP -given blue band for bridge, Introduction to [employer] and therapeutic benefits of water Person educated: Patient Education method: Explanation, Demonstration, Tactile cues, Verbal cues, and Handouts Education comprehension: verbalized understanding, returned demonstration, and needs further education     HOME EXERCISE PROGRAM: Access Code: [email or web address] URL: [email or web address] Date: 07/31/2021 Prepared by: Hessie Diener  Exercises Cat Cow to Child's Pose - 1 x daily - 7 x weekly - 1 sets - 5-10 reps - 10 hold Child's Pose with Sidebending - 1 x daily - 7 x weekly - 1 sets - 5 reps - 30  hold Hooklying Single Knee to Chest Stretch - 2 x daily - 7 x weekly - 1-2 sets - 5 reps - 30 hold Supine Lower Trunk Rotation - 2 x daily - 7 x weekly - 1-2 sets - 10 reps - 10 hold Sidelying Quadratus Lumborum Stretch on Table - 2 x daily - 7 x weekly - 1 sets - 5 reps - 30 hold Pelvic tilt - 2 x daily - 7 x weekly - 2 sets - 10 reps - 5 hold Bridge - 2 x daily - 7 x weekly - 2 sets - 10 reps      ASSESSMENT:   CLINICAL IMPRESSION: Ms [place] enters aquatics for 1st time and is introduced to aquatic setting.  Pt does not report any pain entering pool but does have pain with palpation of left low back and hip.  Pt responded well to Aqua Stretch to Left lateral hip/ low back. Pt able to participate in constant movement and exercise during session.  She does have tight IT B and was assisted with standing quad stretch.  Pt requires the buoyancy of water for active assisted exercises with buoyancy supported for strengthening and AROM exercises: Water will allow for  reduced joint loading through buoyancy to help patient improve posture without excess stress and pain  REHAB POTENTIAL: Excellent   CLINICAL DECISION MAKING: Stable/uncomplicated   EVALUATION COMPLEXITY: Low     GOALS: LONG TERM GOALS:    LTG Name Target Date Goal status  1 Pt will be able to walk, stand for 30 min without L LE pain  Baseline: 09/08/2021 INITIAL  2 Pt will be able to stand/balance  on LLE with improved stability, up to 15 sec Baseline: 09/08/2021 08/08/21 Met  3 Pt will be able to be I with HEP for core and hip strength  Baseline: 09/08/2021 INITIAL  4 Pt will be able to increase L hip strength to 4+/5 for support with gait, stairs  Baseline: 09/08/2021 INITIAL  5 Pt will improve FOTO score by 10% to demo improved functional mobility Baseline: 59% 09/08/2021 INITIAL  PLAN: PT FREQUENCY: 2x/week   PT DURATION: 6 weeks   PLANNED INTERVENTIONS: Therapeutic exercises, Therapeutic activity, Neuro Muscular re-education,  Balance training, Gait training, Patient/Family education, Joint mobilization, Dry Needling, Electrical stimulation, Cryotherapy, Moist heat, and Manual therapy   PLAN FOR NEXT SESSION: Check HEP-add lower ab strength, pelvic mobility, lower abs, NuStep, manual therapy, aquatics, balance   Voncille Lo, PT, [place] Certified Exercise Expert for the Aging Adult  08/13/21 7:11 PM Phone: [phone] Fax: [phone]

## 2021-08-13 ENCOUNTER — Ambulatory Visit: Payer: Medicare Other | Admitting: Physical Therapy

## 2021-08-13 ENCOUNTER — Encounter: Payer: Self-pay | Admitting: Physical Therapy

## 2021-08-13 ENCOUNTER — Other Ambulatory Visit: Payer: Self-pay

## 2021-08-13 DIAGNOSIS — R262 Difficulty in walking, not elsewhere classified: Secondary | ICD-10-CM | POA: Diagnosis not present

## 2021-08-13 DIAGNOSIS — M5416 Radiculopathy, lumbar region: Secondary | ICD-10-CM

## 2021-08-14 ENCOUNTER — Encounter: Payer: Self-pay | Admitting: Physical Therapy

## 2021-08-15 ENCOUNTER — Ambulatory Visit: Payer: Medicare Other | Admitting: Physical Therapy

## 2021-08-15 ENCOUNTER — Encounter: Payer: Self-pay | Admitting: Physical Therapy

## 2021-08-15 ENCOUNTER — Other Ambulatory Visit: Payer: Self-pay

## 2021-08-15 DIAGNOSIS — M5416 Radiculopathy, lumbar region: Secondary | ICD-10-CM | POA: Diagnosis not present

## 2021-08-15 DIAGNOSIS — R262 Difficulty in walking, not elsewhere classified: Secondary | ICD-10-CM | POA: Diagnosis not present

## 2021-08-15 NOTE — Therapy (Signed)
OUTPATIENT PHYSICAL THERAPY TREATMENT NOTE   Patient Name: Tonya Bowers MRN: [id number] DOB:[birth year], 71 y.o., female Today's Date: 08/15/2021  PCP: Libby Maw, MD REFERRING PROVIDER: Libby Maw,*   PT End of Session - 08/15/21 1153     Visit Number 6    Number of Visits 12    Date for PT Re-Evaluation 09/08/21    PT Start Time 3557    PT Stop Time 1228    PT Time Calculation (min) 42 min              Past Medical History:  Diagnosis Date   Arthritis    Cataract    Diverticulosis    GERD (gastroesophageal reflux disease)    Hyperplastic colon polyp 09/09/06   Past Surgical History:  Procedure Laterality Date   ABDOMINAL HYSTERECTOMY     CHOLECYSTECTOMY     COLONOSCOPY     POLYPECTOMY     TUBAL LIGATION     Patient Active Problem List   Diagnosis Date Noted   Elevated LDL cholesterol level 07/03/2021   Need for pneumococcal vaccination 07/03/2021   Lumbar radiculopathy 07/03/2021   Medically noncompliant 04/01/2021   Seasonal allergic rhinitis due to pollen 09/17/2020   Hepatic steatosis 12/22/2019   Elevated glucose 12/22/2019   Cyst of pancreas 12/22/2019   Mid back pain on left side 12/15/2018   Medication refill 12/15/2018   Hypokalemia 12/15/2018   Healthcare maintenance 06/13/2018   Essential hypertension 09/03/2015  PCP: Libby Maw, MD   REFERRING PROVIDER: Dene Gentry, MD   REFERRING DIAG: lumbar radiculopathy  PRECAUTIONS: None   WEIGHT BEARING RESTRICTIONS No  PERTINENT HISTORY:  HTN  ONSET DATE: chronic    Subjective: We tried to stretch my back out some in the pool. If I sit too long I still have sharp pain on my left low back.    PAIN:  Are you having pain? no NPRS scale: 0/10 Pain location: L side low back Pain orientation: Left and Lower  PAIN TYPE: sore Pain description: intermittent and aching  Aggravating factors: sitting too long Relieving factors: heating pad, takes OTC meds.  Lying down   OBJECTIVE:    DIAGNOSTIC FINDINGS:  2020:Vertebral body height is well maintained. Mild osteophytic changes are noted. Mild S-shaped scoliosis in the midthoracic spine is noted. No paraspinal mass is seen.   IMPRESSION: Degenerative change and mild scoliosis.     PATIENT SURVEYS:  Intake FOTO 59%; Status (6th visit)      COGNITION:          Overall cognitive status: Within functional limits for tasks assessed                        SENSATION:          Light touch: Appears intact          Stereognosis: Appears intact           Hot/Cold: Appears intact          Proprioception: Appears intact   MUSCLE LENGTH: Hamstrings: tight Quads: tight   POSTURE:  Lumbar lordosis, stands with trunk flexed                      PALPATION: Pain L side of trunk, thoracolumbar spine and into lateral sacral border, gluteals L side    LUMBARAROM/PROM   A/PROM A/PROM  07/28/2021  Flexion Mid shin LLE tightness  Extension WFL  Right  lateral flexion WFL  Left lateral flexion WFL  Right rotation [facility]  Left rotation WFL    (Blank rows = not tested)   LE MMT:   A/PROM Right 07/28/2021 Left 07/28/2021 Right 08/15/21 Left 08/15/21  Hip flexion 4+/5 4+/5    Hip extension 4+/5 4-/5    Hip abduction   3+/5 4-/5 4/5  Hip adduction        Hip internal rotation        Hip external rotation        Knee flexion 5/5 5/5    Knee extension 5/5 5/5    Ankle dorsiflexion        Ankle plantarflexion        Ankle inversion        Ankle eversion         (Blank rows = not tested)   LE AROM   ROM Right 07/28/2021 Left 07/28/2021  Hip flexion      Hip extension      Hip abduction      Hip adduction      Hip internal rotation      Hip external rotation      Knee flexion      Knee extension      Ankle dorsiflexion      Ankle plantarflexion      Ankle inversion      Ankle eversion       (Blank rows = not tested)   LUMBAR SPECIAL TESTS:  Straight leg raise test: Positive    FUNCTIONAL TESTS:  5 times sit to stand: 17.4 sec   GAIT: Distance walked: 100 Assistive device utilized: None Level of assistance: Complete Independence Comments: develops min limp L LE  [facility] Adult PT Treatment:                                                DATE: 08/15/21 Therapeutic Exercise: Nustep L5 Ue/Le x 5 min Tandem stance 60 sec each  Bilat heel raise Slant board stretch x 30 sec  SLS 16 sec R, 21 sec L Tandem stance on airex >30 sec Hooklying blue band clam Bridge with blue band clam-  Side hip abduction x 10 each  Cat/camel Childs pose with laterals  Prone quad stretch x 60 sec each  Supine hamstring stretch with strap x 60 sec each    [facility] Adult PT Treatment:                                                DATE: 08-13-21 Aquatic Therapy Aquatic therapy at [address] - therapeutic pool temp 87 degrees Pt enters aquatic pool area for 1st time. Treatment took place in water 3.8 to  4 ft 8 in.feet deep depending upon activity.  Pt entered and exited the pool via stair and handrails independently  Walking forward, backward and side stepping 5 widths each  On edge of pool with bil UE support LE exercises Hip abd/add R/Leach and then using 1 UE support Hip ext/flex with knee straight  pt needing VC and TC for correct execution and sequencing Marching knee/hip 90/90  Marching with added pool noodle for increased resistance  Ham curl R/L  Standing gastroc stretch  with great toe flexed at pool wall R and L  Squats  Runners stretch on R and L 30 sec x 2 and then stretch in to runners hamstring stretch 2 x 30 sec R and L Figure 4 squat stretch with UE support for R and L x 60 sec each  Supine abdominal hollowing with VC to keep hips up and feet up with  yellow barbells in UE submerged prone aquatic  plank with barbell submerged for abdominal engagement, difficulty keeping hip elevated Aqua stretch over Left low back and Left hip with decrease in  intensity of pain    [facility] Adult PT Treatment:                                                DATE: 08/08/21 Therapeutic Exercise: Nustep L5 Ue/Le x 5 min Tandem stance Bilat heel raise Slant board stretch x 30 sec  SLS 23 sec R, 28 sec L QL stretch in doorway x 3 each way  Side lying Left QL stretch over rolled pillow Bridge x15 with initial PPT PPT with Bent knee raises Sidelying clam x 15 bilat red band added  Hooklying blue band clam Bridge with blue band clam- added to HEP  Cat/camel Childs pose with laterals          PATIENT EDUCATION:  Education details: FOTO status discussed with patient Person educated: Patient Education method: Explanation, Demonstration, Tactile cues, Verbal cues, and Handouts Education comprehension: verbalized understanding, returned demonstration, and needs further education     HOME EXERCISE PROGRAM: Access Code: [email or web address] URL: [email or web address] Date: 07/31/2021 Prepared by: Hessie Diener  Exercises Cat Cow to Child's Pose - 1 x daily - 7 x weekly - 1 sets - 5-10 reps - 10 hold Child's Pose with Sidebending - 1 x daily - 7 x weekly - 1 sets - 5 reps - 30 hold Hooklying Single Knee to Chest Stretch - 2 x daily - 7 x weekly - 1-2 sets - 5 reps - 30 hold Supine Lower Trunk Rotation - 2 x daily - 7 x weekly - 1-2 sets - 10 reps - 10 hold Sidelying Quadratus Lumborum Stretch on Table - 2 x daily - 7 x weekly - 1 sets - 5 reps - 30 hold Pelvic tilt - 2 x daily - 7 x weekly - 2 sets - 10 reps - 5 hold Bridge - 2 x daily - 7 x weekly - 2 sets - 10 reps      ASSESSMENT:   CLINICAL IMPRESSION: Pt reports improvement. She reports back pain with standing and walking has diminished however she has sharp pain after prolonged sitting. She thought aquatic therapy was helpful to stretch out her back. Her tandem balance is > 60 sec and she reports feeling stronger. Her hip abduction strength is slightly improved. Added quad and  hamstring stretches today with good tolerance.   REHAB POTENTIAL: Excellent   CLINICAL DECISION MAKING: Stable/uncomplicated   EVALUATION COMPLEXITY: Low     GOALS: LONG TERM GOALS:    LTG Name Target Date Goal status  1 Pt will be able to walk, stand for 30 min without L LE pain  Baseline: 09/08/2021 INITIAL  2 Pt will be able to stand/balance  on LLE with improved stability, up to 15 sec Baseline: 09/08/2021 08/08/21 Met  3 Pt will be able to be  I with HEP for core and hip strength  Baseline: 09/08/2021 INITIAL  4 Pt will be able to increase L hip strength to 4+/5 for support with gait, stairs  Baseline: 09/08/2021 INITIAL  5 Pt will improve FOTO score by 10% to demo improved functional mobility Baseline: 59% 09/08/2021 INITIAL  PLAN: PT FREQUENCY: 2x/week   PT DURATION: 6 weeks   PLANNED INTERVENTIONS: Therapeutic exercises, Therapeutic activity, Neuro Muscular re-education, Balance training, Gait training, Patient/Family education, Joint mobilization, Dry Needling, Electrical stimulation, Cryotherapy, Moist heat, and Manual therapy   PLAN FOR NEXT SESSION: Check HEP-add lower ab strength to HEP next land visit, pelvic mobility, lower abs, NuStep, manual therapy, aquatics, balance   Hessie Diener, PTA 08/15/21 1:33 PM Phone: [phone] Fax: [phone] 1

## 2021-08-19 NOTE — Therapy (Signed)
OUTPATIENT PHYSICAL THERAPY TREATMENT NOTE   Patient Name: Tonya Bowers MRN: [id number] DOB:1950/10/27, 71 y.o., female Today's Date: 08/20/2021  PCP: Libby Maw, MD REFERRING PROVIDER: Dene Gentry, MD   PT End of Session - 08/20/21 1507     Visit Number 7    Number of Visits 12    Date for PT Re-Evaluation 09/08/21    PT Start Time 1505    PT Stop Time 1550    PT Time Calculation (min) 45 min    Activity Tolerance Patient tolerated treatment well    Behavior During Therapy [facility] for tasks assessed/performed               Past Medical History:  Diagnosis Date   Arthritis    Cataract    Diverticulosis    GERD (gastroesophageal reflux disease)    Hyperplastic colon polyp 09/09/06   Past Surgical History:  Procedure Laterality Date   ABDOMINAL HYSTERECTOMY     CHOLECYSTECTOMY     COLONOSCOPY     POLYPECTOMY     TUBAL LIGATION     Patient Active Problem List   Diagnosis Date Noted   Elevated LDL cholesterol level 07/03/2021   Need for pneumococcal vaccination 07/03/2021   Lumbar radiculopathy 07/03/2021   Medically noncompliant 04/01/2021   Seasonal allergic rhinitis due to pollen 09/17/2020   Hepatic steatosis 12/22/2019   Elevated glucose 12/22/2019   Cyst of pancreas 12/22/2019   Mid back pain on left side 12/15/2018   Medication refill 12/15/2018   Hypokalemia 12/15/2018   Healthcare maintenance 06/13/2018   Essential hypertension 09/03/2015  PCP: Libby Maw, MD   REFERRING PROVIDER: Dene Gentry, MD   REFERRING DIAG: lumbar radiculopathy  PRECAUTIONS: None   WEIGHT BEARING RESTRICTIONS No  PERTINENT HISTORY:  HTN  ONSET DATE: chronic    Subjective:  I could not believe how tired I was last time I did aquatics.   PAIN:  Are you having pain? no NPRS scale: 0/10 Pain location: L side low back Pain orientation: Left and Lower  PAIN TYPE: sore Pain description: intermittent and aching  Aggravating factors:  sitting too long Relieving factors: heating pad, takes OTC meds. Lying down   OBJECTIVE:    DIAGNOSTIC FINDINGS:  2020:Vertebral body height is well maintained. Mild osteophytic changes are noted. Mild S-shaped scoliosis in the midthoracic spine is noted. No paraspinal mass is seen.   IMPRESSION: Degenerative change and mild scoliosis.     PATIENT SURVEYS:  Intake FOTO 59%; Status (6th visit)      COGNITION:          Overall cognitive status: Within functional limits for tasks assessed                        SENSATION:          Light touch: Appears intact          Stereognosis: Appears intact           Hot/Cold: Appears intact          Proprioception: Appears intact   MUSCLE LENGTH: Hamstrings: tight Quads: tight   POSTURE:  Lumbar lordosis, stands with trunk flexed                      PALPATION: Pain L side of trunk, thoracolumbar spine and into lateral sacral border, gluteals L side    LUMBARAROM/PROM   A/PROM A/PROM  07/28/2021  Flexion  Mid shin LLE tightness  Extension WFL  Right lateral flexion WFL  Left lateral flexion WFL  Right rotation [facility]  Left rotation WFL    (Blank rows = not tested)   LE MMT:   A/PROM Right 07/28/2021 Left 07/28/2021 Right 08/15/21 Left 08/15/21  Hip flexion 4+/5 4+/5    Hip extension 4+/5 4-/5    Hip abduction   3+/5 4-/5 4/5  Hip adduction        Hip internal rotation        Hip external rotation        Knee flexion 5/5 5/5    Knee extension 5/5 5/5    Ankle dorsiflexion        Ankle plantarflexion        Ankle inversion        Ankle eversion         (Blank rows = not tested)   LE AROM   ROM Right 07/28/2021 Left 07/28/2021  Hip flexion      Hip extension      Hip abduction      Hip adduction      Hip internal rotation      Hip external rotation      Knee flexion      Knee extension      Ankle dorsiflexion      Ankle plantarflexion      Ankle inversion      Ankle eversion       (Blank rows = not tested)    LUMBAR SPECIAL TESTS:  Straight leg raise test: Positive   FUNCTIONAL TESTS:  5 times sit to stand: 17.4 sec   GAIT: Distance walked: 100 Assistive device utilized: None Level of assistance: Complete Independence Comments: develops min limp L LE  [facility] Adult PT Treatment:                                                DATE: 08-20-21 Aquatic Therapy Aquatic therapy at [address] - therapeutic pool temp 90 degrees Pt enters aquatic pool area for 1st time. Treatment took place in water 3.8 to  4 ft 8 in.feet deep depending upon activity.  Pt entered and exited the pool via stair and handrails independently   Walking forward, backward and side stepping 5 widths each with aquatic cuffs and yellow DB for added resistance and abdominal engagement   On edge of pool with bil UE support LE exercises  Standing gastroc stretch with great toe flexed at pool wall R and L  Squats  Runners stretch on R and L 30 sec x 2 and then stretch in to runners hamstring stretch 2 x 30 sec R and L Figure 4 squat stretch with UE support for R and L x 60 sec each  Hip abd/add R/Leach and then using 1 UE support Hip ext/flex with knee straight with added pool noodle for increased resistance  pt needing VC and TC for correct execution and sequencing Marching knee/hip 90/90  Thoracic open book with side to pool wall on R and L Supine abdominal hollowing with VC to keep hips up and feet up with  yellow barbells in UE submerged , required assistance by PT to keep afloat, Pt not confident with ability to float in water   Bad Ragaz, Pt with lumbar belt around hips and nek doodle  for neck support and yellow noodle under shoulders.   Pt assisted into supine floating position by lying head on shoulder of PT to get into floating position. Ms Cottingham initially apprehensive about floating in the water. . PT at torso and assisting with trunk left to right and vice versa to engage trunk muscles. PT then rotated  trunk in order to engage abdominal (internal and external obliques) Emphasis on breathing techniques to draw in abdominals for support.  Pt then utilizing posterior chain and engaging Hip extension and knee flexion with water resistance while PT used Aqua stretch techniques to decrease muscle tension in left low back.Aqua stretch over Left low back and Left hip with decrease in intensity of pain    Posterior chain, hip ext, ham curl and abd/add of hips in supine floating LAD of Left LE for added decompression of spine     Pt requires the buoyancy of water for active assisted exercises with buoyancy supported for strengthening and AROM exercises: PT  requires the viscosity of the water for resistance with strengthening exercises Hydrostatic pressure also supports joints by unweighting joint load by at least 50 % in 3-4 feet depth water. 80% in chest to neck deep water. Water will allow for  reduced joint loading through buoyancy to help patient improve posture without excess stress and pain  [facility] Adult PT Treatment:                                                DATE: 08-20-21  Aquatic Therapy Aquatic therapy at [address] - therapeutic pool temp 87 degrees Pt enters aquatic pool area for 1st time. Treatment took place in water 3.8 to  4 ft 8 in.feet deep depending upon activity.  Pt entered and exited the pool via stair and handrails independently   Walking forward, backward and side stepping 5 widths each   On edge of pool with bil UE support LE exercises Hip abd/add R/Leach and then using 1 UE support Hip ext/flex with knee straight  pt needing VC and TC for correct execution and sequencing Marching knee/hip 90/90  Marching with added pool noodle for increased resistance  Ham curl R/L  Standing gastroc stretch with great toe flexed at pool wall R and L  Squats  Runners stretch on R and L 30 sec x 2 and then stretch in to runners hamstring stretch 2 x 30 sec R and  L Figure 4 squat stretch with UE support for R and L x 60 sec each  Supine abdominal hollowing with VC to keep hips up and feet up with  yellow barbells in UE submerged prone aquatic  plank with barbell submerged for abdominal engagement, difficulty keeping hip elevated SLS stance 30 sec hold x 5 L and R Aqua stretch over Left low back and Left hip with decrease in intensity of pain     Pt requires the buoyancy of water for active assisted exercises with buoyancy supported for strengthening and AROM exercises: PT  requires the viscosity of the water for resistance with strengthening exercises Hydrostatic pressure also supports joints by unweighting joint load by at least 50 % in 3-4 feet depth water. 80% in chest to neck deep water. Water will allow for  reduced joint loading through buoyancy to help patient improve posture without excess stress and pain   [facility]  Adult PT Treatment:                                                DATE: 08/15/21 Therapeutic Exercise: Nustep L5 Ue/Le x 5 min Tandem stance 60 sec each  Bilat heel raise Slant board stretch x 30 sec  SLS 16 sec R, 21 sec L Tandem stance on airex >30 sec Hooklying blue band clam Bridge with blue band clam-  Side hip abduction x 10 each  Cat/camel Childs pose with laterals  Prone quad stretch x 60 sec each  Supine hamstring stretch with strap x 60 sec each    [facility] Adult PT Treatment:                                                DATE: 08-13-21 Aquatic Therapy Aquatic therapy at [address] - therapeutic pool temp 87 degrees Pt enters aquatic pool area for 1st time. Treatment took place in water 3.8 to  4 ft 8 in.feet deep depending upon activity.  Pt entered and exited the pool via stair and handrails independently  Walking forward, backward and side stepping 5 widths each  On edge of pool with bil UE support LE exercises Hip abd/add R/Leach and then using 1 UE support Hip ext/flex with knee straight  pt  needing VC and TC for correct execution and sequencing Marching knee/hip 90/90  Marching with added pool noodle for increased resistance  Ham curl R/L  Standing gastroc stretch with great toe flexed at pool wall R and L  Squats  Runners stretch on R and L 30 sec x 2 and then stretch in to runners hamstring stretch 2 x 30 sec R and L Figure 4 squat stretch with UE support for R and L x 60 sec each  Supine abdominal hollowing with VC to keep hips up and feet up with  yellow barbells in UE submerged prone aquatic  plank with barbell submerged for abdominal engagement, difficulty keeping hip elevated Aqua stretch over Left low back and Left hip with decrease in intensity of pain    [facility] Adult PT Treatment:                                                DATE: 08/08/21 Therapeutic Exercise: Nustep L5 Ue/Le x 5 min Tandem stance Bilat heel raise Slant board stretch x 30 sec  SLS 23 sec R, 28 sec L QL stretch in doorway x 3 each way  Side lying Left QL stretch over rolled pillow Bridge x15 with initial PPT PPT with Bent knee raises Sidelying clam x 15 bilat red band added  Hooklying blue band clam Bridge with blue band clam- added to HEP  Cat/camel Childs pose with laterals          PATIENT EDUCATION:  Education details: FOTO status discussed with patient, community wellness / aquatic opportunities Person educated: Patient Education method: Consulting civil engineer, Media planner, Corporate treasurer cues, Verbal cues, and Handouts Education comprehension: verbalized understanding, returned demonstration, and needs further education     HOME EXERCISE PROGRAM: Access Code: [email or web address] URL:  [email or web address] Date: 07/31/2021 Prepared by: Hessie Diener  Exercises Cat Cow to Child's Pose - 1 x daily - 7 x weekly - 1 sets - 5-10 reps - 10 hold Child's Pose with Sidebending - 1 x daily - 7 x weekly - 1 sets - 5 reps - 30 hold Hooklying Single Knee to Chest Stretch - 2 x daily - 7 x weekly  - 1-2 sets - 5 reps - 30 hold Supine Lower Trunk Rotation - 2 x daily - 7 x weekly - 1-2 sets - 10 reps - 10 hold Sidelying Quadratus Lumborum Stretch on Table - 2 x daily - 7 x weekly - 1 sets - 5 reps - 30 hold Pelvic tilt - 2 x daily - 7 x weekly - 2 sets - 10 reps - 5 hold Bridge - 2 x daily - 7 x weekly - 2 sets - 10 reps      ASSESSMENT:   CLINICAL IMPRESSION: Pt  reports no pain when entering pool area. Ms Courser was initially apprehensive about floating in the water for some posterior chain work.  Pt did have some difficulty raising shoulders about her head but was able to work out inflexibility in the water for full body exercise.  Pt able to participate for full 40 minutes without  Pt requires the buoyancy of water for active assisted exercises with buoyancy supported for strengthening and AROM exercises: PT  requires the viscosity of the water for resistance with strengthening exercises rest for increasing exercise tolerance and endurance.    REHAB POTENTIAL: Excellent   CLINICAL DECISION MAKING: Stable/uncomplicated   EVALUATION COMPLEXITY: Low     GOALS: LONG TERM GOALS:    LTG Name Target Date Goal status  1 Pt will be able to walk, stand for 30 min without L LE pain  Baseline: 09/08/2021 INITIAL  2 Pt will be able to stand/balance  on LLE with improved stability, up to 15 sec Baseline: 09/08/2021 08/08/21 Met  3 Pt will be able to be I with HEP for core and hip strength  Baseline: 09/08/2021 INITIAL  4 Pt will be able to increase L hip strength to 4+/5 for support with gait, stairs  Baseline: 09/08/2021 INITIAL  5 Pt will improve FOTO score by 10% to demo improved functional mobility Baseline: 59% 09/08/2021 INITIAL  PLAN: PT FREQUENCY: 2x/week   PT DURATION: 6 weeks   PLANNED INTERVENTIONS: Therapeutic exercises, Therapeutic activity, Neuro Muscular re-education, Balance training, Gait training, Patient/Family education, Joint mobilization, Dry Needling, Electrical  stimulation, Cryotherapy, Moist heat, and Manual therapy   PLAN FOR NEXT SESSION: Check HEP-add lower ab strength to HEP next land visit, pelvic mobility, lower abs, NuStep, manual therapy, aquatics, balance  Aquatics HEP   Voncille Lo, PT, [place] Certified Exercise Expert for the Aging Adult  08/20/21 4:05 PM Phone: [phone] Fax: [phone]

## 2021-08-20 ENCOUNTER — Ambulatory Visit: Payer: Medicare Other | Admitting: Family Medicine

## 2021-08-20 ENCOUNTER — Ambulatory Visit: Payer: Medicare Other | Admitting: Physical Therapy

## 2021-08-20 ENCOUNTER — Encounter: Payer: Self-pay | Admitting: Physical Therapy

## 2021-08-20 ENCOUNTER — Other Ambulatory Visit: Payer: Self-pay

## 2021-08-20 VITALS — BP 163/80 | Ht 65.0 in | Wt 230.0 lb

## 2021-08-20 DIAGNOSIS — R262 Difficulty in walking, not elsewhere classified: Secondary | ICD-10-CM | POA: Diagnosis not present

## 2021-08-20 DIAGNOSIS — M5416 Radiculopathy, lumbar region: Secondary | ICD-10-CM | POA: Diagnosis not present

## 2021-08-20 DIAGNOSIS — M25511 Pain in right shoulder: Secondary | ICD-10-CM

## 2021-08-20 MED ORDER — NITROGLYCERIN 0.2 MG/HR TD PT24: MEDICATED_PATCH | TRANSDERMAL | 1 refills | Status: DC

## 2021-08-20 NOTE — Patient Instructions (Signed)
You have rotator cuff impingement Try to avoid painful activities (overhead activities, lifting with extended arm) as much as possible. Meloxicam 15mg  daily with food as needed for pain and inflammation. Nitro patches 1/4th patch to affected shoulder, change daily. Can take tylenol in addition to this. Subacromial injection may be beneficial to help with pain and to decrease inflammation. Consider physical therapy with transition to home exercise program. Do home exercise program with theraband and scapular stabilization exercises daily 3 sets of 10 once a day. If not improving at follow-up we will consider further imaging, injection, physical therapy. Continue the physical therapy and home exercises for your back. Follow up with me in 6 weeks.

## 2021-08-21 ENCOUNTER — Encounter: Payer: Self-pay | Admitting: Physical Therapy

## 2021-08-21 ENCOUNTER — Encounter: Payer: Self-pay | Admitting: Family Medicine

## 2021-08-21 NOTE — Progress Notes (Signed)
PCP: Libby Maw, MD  Subjective:   HPI: Patient is a 71 y.o. female here for low back pain.  1/11: Patient reports she's had off and on low back pain for a long time. Current pain in low back worse the past 1-2 weeks. Associated with radiation into the left leg to about the knee. No numbness. No bowel/bladder dysfunction. Has only tried extra strength tylenol for this.  2/15: Patient's back is improving with physical therapy. No radiation into leg any longer. Feels some pain with sitting over an hour but this is improving. Home stretches have helped also. Taking meloxicam. No numbness/tingling. Reports pain also in her right shoulder has persisted since last visit. Pain lateral and radiates down arm to about the forearm. No neck pain, numbness/tingling. Worse with arm motions. Is right handed.  Past Medical History:  Diagnosis Date   Arthritis    Cataract    Diverticulosis    GERD (gastroesophageal reflux disease)    Hyperplastic colon polyp 09/09/06    Current Outpatient Medications on File Prior to Visit  Medication Sig Dispense Refill   calcium carbonate (OSCAL) 1500 (600 Ca) MG TABS tablet Take 600 mg of elemental calcium by mouth 2 (two) times daily with a meal.     cholecalciferol (VITAMIN D3) 25 MCG (1000 UNIT) tablet Take 1,000 Units by mouth daily.     estradiol (ESTRACE) 0.5 MG tablet Take 0.5 mg by mouth daily.  2   meloxicam (MOBIC) 15 MG tablet Take 1 tablet (15 mg total) by mouth daily. 30 tablet 1   Multiple Vitamins-Minerals (MULTIVITAMIN WITH MINERALS) tablet Take 1 tablet by mouth daily.     pravastatin (PRAVACHOL) 20 MG tablet Take 1 tablet (20 mg total) by mouth at bedtime. 90 tablet 1   spironolactone (ALDACTONE) 25 MG tablet Take 1 tablet (25 mg total) by mouth daily. 90 tablet 1   No current facility-administered medications on file prior to visit.    Past Surgical History:  Procedure Laterality Date   ABDOMINAL HYSTERECTOMY      CHOLECYSTECTOMY     COLONOSCOPY     POLYPECTOMY     TUBAL LIGATION      Allergies  Allergen Reactions   Lisinopril Cough   Penicillins     BP (!) 163/80    Ht 5\' 5"  (1.651 m)    Wt 230 lb (104.3 kg)    LMP 07/07/1995    BMI 38.27 kg/m   No flowsheet data found.  No flowsheet data found.      Objective:  Physical Exam:  Gen: NAD, comfortable in exam room  No gross deformity, scoliosis. TTP minimally left paraspinal lumbar region.  No midline or bony TTP. Strength LEs 5/5 all muscle groups.   Negative SLRs. Sensation intact to light touch bilaterally.  Right shoulder: No swelling, ecchymoses.  No gross deformity. No TTP. FROM. Positive Hawkins, Neers. Negative Yergasons. Strength 5/5 with empty can and resisted internal/external rotation. Negative apprehension. NV intact distally.   Assessment & Plan:  1. Right shoulder pain - 2/2 rotator cuff impingement.  Continue meloxicam.  Home exercise program reviewed.  Nitro patches - discussed risks of skin irritation and headaches.  F/u in 6 weeks.  2. Low back pain - improving with physical therapy - continue this and home exercises/stretches, meloxicam.

## 2021-08-22 ENCOUNTER — Encounter: Payer: Self-pay | Admitting: Physical Therapy

## 2021-08-22 ENCOUNTER — Other Ambulatory Visit: Payer: Self-pay

## 2021-08-22 ENCOUNTER — Ambulatory Visit: Payer: Medicare Other | Admitting: Physical Therapy

## 2021-08-22 DIAGNOSIS — M5416 Radiculopathy, lumbar region: Secondary | ICD-10-CM

## 2021-08-22 DIAGNOSIS — R262 Difficulty in walking, not elsewhere classified: Secondary | ICD-10-CM

## 2021-08-22 NOTE — Therapy (Signed)
OUTPATIENT PHYSICAL THERAPY TREATMENT NOTE   Patient Name: Tonya Bowers MRN: [id number] DOB:May 11, 1951, 71 y.o., female Today's Date: 08/22/2021  PCP: Libby Maw, MD REFERRING PROVIDER: Libby Maw,*   PT End of Session - 08/22/21 1152     Visit Number 8    Number of Visits 12    Date for PT Re-Evaluation 09/08/21    PT Start Time 1150    PT Stop Time 1230    PT Time Calculation (min) 40 min    Activity Tolerance Patient tolerated treatment well    Behavior During Therapy [facility] for tasks assessed/performed               Past Medical History:  Diagnosis Date   Arthritis    Cataract    Diverticulosis    GERD (gastroesophageal reflux disease)    Hyperplastic colon polyp 09/09/06   Past Surgical History:  Procedure Laterality Date   ABDOMINAL HYSTERECTOMY     CHOLECYSTECTOMY     COLONOSCOPY     POLYPECTOMY     TUBAL LIGATION     Patient Active Problem List   Diagnosis Date Noted   Elevated LDL cholesterol level 07/03/2021   Need for pneumococcal vaccination 07/03/2021   Lumbar radiculopathy 07/03/2021   Medically noncompliant 04/01/2021   Seasonal allergic rhinitis due to pollen 09/17/2020   Hepatic steatosis 12/22/2019   Elevated glucose 12/22/2019   Cyst of pancreas 12/22/2019   Mid back pain on left side 12/15/2018   Medication refill 12/15/2018   Hypokalemia 12/15/2018   Healthcare maintenance 06/13/2018   Essential hypertension 09/03/2015  PCP: Libby Maw, MD   REFERRING PROVIDER: Dene Gentry, MD   REFERRING DIAG: lumbar radiculopathy  PRECAUTIONS: None   WEIGHT BEARING RESTRICTIONS No  PERTINENT HISTORY:  HTN  ONSET DATE: chronic    Subjective:  No pain today.  Pain if I sit too long its not good.  I have been keeping my grandkids .  PAIN:  Are you having pain? no NPRS scale: 0/10 Pain location: L side low back Pain orientation: Left and Lower  PAIN TYPE: sore Pain description: intermittent and  aching  Aggravating factors: sitting too long Relieving factors: heating pad, takes OTC meds. Lying down   OBJECTIVE:    DIAGNOSTIC FINDINGS:  2020:Vertebral body height is well maintained. Mild osteophytic changes are noted. Mild S-shaped scoliosis in the midthoracic spine is noted. No paraspinal mass is seen.   IMPRESSION: Degenerative change and mild scoliosis.     PATIENT SURVEYS:  Intake FOTO 59%; Status (6th visit) 67%     COGNITION:          Overall cognitive status: Within functional limits for tasks assessed                        SENSATION:          Light touch: Appears intact          Stereognosis: Appears intact           Hot/Cold: Appears intact          Proprioception: Appears intact   MUSCLE LENGTH: Hamstrings: tight Quads: tight   POSTURE:  Lumbar lordosis, stands with trunk flexed                      PALPATION: Pain L side of trunk, thoracolumbar spine and into lateral sacral border, gluteals L side    LUMBARAROM/PROM   A/PROM  A/PROM  07/28/2021  Flexion Mid shin LLE tightness  Extension WFL  Right lateral flexion WFL  Left lateral flexion WFL  Right rotation [facility]  Left rotation WFL    (Blank rows = not tested)   LE MMT:   A/PROM Right 07/28/2021 Left 07/28/2021 Right 08/15/21 Left 08/15/21  Hip flexion 4+/5 4+/5    Hip extension 4+/5 4-/5    Hip abduction   3+/5 4-/5 4/5  Hip adduction        Hip internal rotation        Hip external rotation        Knee flexion 5/5 5/5    Knee extension 5/5 5/5    Ankle dorsiflexion        Ankle plantarflexion        Ankle inversion        Ankle eversion         (Blank rows = not tested)   LE AROM   ROM Right 07/28/2021 Left 07/28/2021  Hip flexion      Hip extension      Hip abduction      Hip adduction      Hip internal rotation      Hip external rotation      Knee flexion      Knee extension      Ankle dorsiflexion      Ankle plantarflexion      Ankle inversion      Ankle eversion        (Blank rows = not tested)   LUMBAR SPECIAL TESTS:  Straight leg raise test: Positive   FUNCTIONAL TESTS:  5 times sit to stand: 17.4 sec   GAIT: Distance walked: 100 Assistive device utilized: None Level of assistance: Complete Independence Comments: develops min limp L LE  [facility] Adult PT Treatment:                                                DATE: 08/22/21 Therapeutic Exercise: Recumbent bike 6 min L2  HEP review and perform Post pelvic tilt to bridge x 10  Lower trunk rotation Knee to chest  Seated physio ball exercises Pelvic tilt x 10 , lateral and A/P Arm flex, heel raises, opp arm opp LE Palloff press limited by shoulder pain , blue band x 10 each  Squats x 15  Lateral step ups x 10 with hip abd x 10  Childs pose forward an lateral bias x 3   Self Care: HEP modifications for standing ad seated core      [facility] Adult PT Treatment:                                                DATE: 08-20-21  Aquatic Therapy Aquatic therapy at [address] - therapeutic pool temp 87 degrees Pt enters aquatic pool area for 1st time. Treatment took place in water 3.8 to  4 ft 8 in.feet deep depending upon activity.  Pt entered and exited the pool via stair and handrails independently   Walking forward, backward and side stepping 5 widths each   On edge of pool with bil UE support LE exercises Hip abd/add R/Leach and then using 1 UE support  Hip ext/flex with knee straight  pt needing VC and TC for correct execution and sequencing Marching knee/hip 90/90  Marching with added pool noodle for increased resistance  Ham curl R/L  Standing gastroc stretch with great toe flexed at pool wall R and L  Squats  Runners stretch on R and L 30 sec x 2 and then stretch in to runners hamstring stretch 2 x 30 sec R and L Figure 4 squat stretch with UE support for R and L x 60 sec each  Supine abdominal hollowing with VC to keep hips up and feet up with  yellow barbells in UE  submerged prone aquatic  plank with barbell submerged for abdominal engagement, difficulty keeping hip elevated SLS stance 30 sec hold x 5 L and R Aqua stretch over Left low back and Left hip with decrease in intensity of pain     Pt requires the buoyancy of water for active assisted exercises with buoyancy supported for strengthening and AROM exercises: PT  requires the viscosity of the water for resistance with strengthening exercises Hydrostatic pressure also supports joints by unweighting joint load by at least 50 % in 3-4 feet depth water. 80% in chest to neck deep water. Water will allow for  reduced joint loading through buoyancy to help patient improve posture without excess stress and pain   [facility] Adult PT Treatment:                                                DATE: 08/15/21 Therapeutic Exercise: Nustep L5 Ue/Le x 5 min Tandem stance 60 sec each  Bilat heel raise Slant board stretch x 30 sec  SLS 16 sec R, 21 sec L Tandem stance on airex >30 sec Hooklying blue band clam Bridge with blue band clam-  Side hip abduction x 10 each  Cat/camel Childs pose with laterals  Prone quad stretch x 60 sec each  Supine hamstring stretch with strap x 60 sec each    [facility] Adult PT Treatment:                                                DATE: 08-13-21 Aquatic Therapy Aquatic therapy at [address] - therapeutic pool temp 87 degrees Pt enters aquatic pool area for 1st time. Treatment took place in water 3.8 to  4 ft 8 in.feet deep depending upon activity.  Pt entered and exited the pool via stair and handrails independently  Walking forward, backward and side stepping 5 widths each  On edge of pool with bil UE support LE exercises Hip abd/add R/Leach and then using 1 UE support Hip ext/flex with knee straight  pt needing VC and TC for correct execution and sequencing Marching knee/hip 90/90  Marching with added pool noodle for increased resistance  Ham curl R/L   Standing gastroc stretch with great toe flexed at pool wall R and L  Squats  Runners stretch on R and L 30 sec x 2 and then stretch in to runners hamstring stretch 2 x 30 sec R and L Figure 4 squat stretch with UE support for R and L x 60 sec each  Supine abdominal hollowing with VC to keep hips up and  feet up with  yellow barbells in UE submerged prone aquatic  plank with barbell submerged for abdominal engagement, difficulty keeping hip elevated Aqua stretch over Left low back and Left hip with decrease in intensity of pain    [facility] Adult PT Treatment:                                                DATE: 08/08/21 Therapeutic Exercise: Nustep L5 Ue/Le x 5 min Tandem stance Bilat heel raise Slant board stretch x 30 sec  SLS 23 sec R, 28 sec L QL stretch in doorway x 3 each way  Side lying Left QL stretch over rolled pillow Bridge x15 with initial PPT PPT with Bent knee raises Sidelying clam x 15 bilat red band added  Hooklying blue band clam Bridge with blue band clam- added to HEP  Cat/camel Childs pose with laterals          PATIENT EDUCATION:  Education details: FOTO status discussed with patient, community wellness / aquatic opportunities Person educated: Patient Education method: Consulting civil engineer, Media planner, Corporate treasurer cues, Verbal cues, and Handouts Education comprehension: verbalized understanding, returned demonstration, and needs further education     HOME EXERCISE PROGRAM: Access Code: [email or web address] URL: [email or web address] Date: 07/31/2021 Prepared by: Hessie Diener   Access Code: [email or web address] URL: [email or web address] Date: 08/22/2021 Prepared by: Raeford Razor  Exercises Cat Cow to Child's Pose - 1 x daily - 7 x weekly - 1 sets - 5-10 reps - 10 hold Child's Pose with Sidebending - 1 x daily - 7 x weekly - 1 sets - 5 reps - 30 hold Hooklying Single Knee to Chest Stretch - 2 x daily - 7 x weekly - 1-2 sets - 5 reps - 30 hold Supine  Lower Trunk Rotation - 2 x daily - 7 x weekly - 1-2 sets - 10 reps - 10 hold Sidelying Quadratus Lumborum Stretch on Table - 2 x daily - 7 x weekly - 1 sets - 5 reps - 30 hold Pelvic tilt - 2 x daily - 7 x weekly - 2 sets - 10 reps - 5 hold Bridge - 2 x daily - 7 x weekly - 2 sets - 10 reps Seated Posterior Pelvic Tilt - 1 x daily - 7 x weekly - 2 sets - 10 reps - 5 hold Alternating Shoulder Flexion Seated on Swiss Ball - 1 x daily - 7 x weekly - 2 sets - 10 reps - 3 hold Swiss Ball March - 1 x daily - 7 x weekly - 2 sets - 10 reps - 3 hold Standing Anti-Rotation Press with Anchored Resistance - 1 x daily - 7 x weekly - 2 sets - 10 reps - 3 hold    ASSESSMENT:   CLINICAL IMPRESSION:   Patient doing well, her shoulder limited her pain more than her back.  Her back pain did increase to 2/10 with standing a bit for hip abduction. Next week she may either DC or consider continuing until end of POC. She is making good functional progress and is near  target FOTO score.   REHAB POTENTIAL: Excellent   CLINICAL DECISION MAKING: Stable/uncomplicated   EVALUATION COMPLEXITY: Low     GOALS: LONG TERM GOALS:    LTG Name Target Date Goal status  1 Pt will be able to walk, stand for 30 min without L LE pain  Baseline: 09/08/2021 ongoing  2 Pt will be able to stand/balance  on LLE with improved stability, up to 15 sec Baseline: 09/08/2021 08/08/21 Met  3 Pt will be able to be I with HEP for core and hip strength  Baseline: 09/08/2021 In progress  4 Pt will be able to increase L hip strength to 4+/5 for support with gait, stairs  Baseline: 09/08/2021 In progress   5 Pt will improve FOTO score by 10% to demo improved functional mobility Baseline: 67% 09/08/2021 In progress   PLAN:  PT FREQUENCY: 2x/week   PT DURATION: 6 weeks   PLANNED INTERVENTIONS: Therapeutic exercises, Therapeutic activity, Neuro Muscular re-education, Balance training, Gait training, Patient/Family education, Joint  mobilization, Dry Needling, Electrical stimulation, Cryotherapy, Moist heat, and Manual therapy   PLAN FOR NEXT SESSION: check new HEP.  Monitor shoulder pain . General core and LE strength .  Raeford Razor, PT 08/22/21 12:43 PM Phone: [phone] Fax: [phone]

## 2021-08-26 ENCOUNTER — Other Ambulatory Visit: Payer: Self-pay | Admitting: Family Medicine

## 2021-08-27 ENCOUNTER — Ambulatory Visit: Payer: Medicare Other | Admitting: Physical Therapy

## 2021-08-27 ENCOUNTER — Other Ambulatory Visit: Payer: Self-pay

## 2021-08-27 ENCOUNTER — Encounter: Payer: Self-pay | Admitting: Physical Therapy

## 2021-08-27 DIAGNOSIS — M5416 Radiculopathy, lumbar region: Secondary | ICD-10-CM

## 2021-08-27 DIAGNOSIS — R262 Difficulty in walking, not elsewhere classified: Secondary | ICD-10-CM | POA: Diagnosis not present

## 2021-08-27 NOTE — Therapy (Signed)
OUTPATIENT PHYSICAL THERAPY TREATMENT NOTE   Patient Name: Tonya Bowers MRN: [id number] DOB:Oct 29, 1950, 71 y.o., female Today's Date: 08/27/2021  PCP: Libby Maw, MD REFERRING PROVIDER: Dene Gentry, MD   PT End of Session - 08/27/21 1510     Visit Number 9    Number of Visits 12    Date for PT Re-Evaluation 09/08/21    PT Start Time 1501    PT Stop Time 1544    PT Time Calculation (min) 43 min    Activity Tolerance Patient tolerated treatment well    Behavior During Therapy [facility] for tasks assessed/performed                Past Medical History:  Diagnosis Date   Arthritis    Cataract    Diverticulosis    GERD (gastroesophageal reflux disease)    Hyperplastic colon polyp 09/09/06   Past Surgical History:  Procedure Laterality Date   ABDOMINAL HYSTERECTOMY     CHOLECYSTECTOMY     COLONOSCOPY     POLYPECTOMY     TUBAL LIGATION     Patient Active Problem List   Diagnosis Date Noted   Elevated LDL cholesterol level 07/03/2021   Need for pneumococcal vaccination 07/03/2021   Lumbar radiculopathy 07/03/2021   Medically noncompliant 04/01/2021   Seasonal allergic rhinitis due to pollen 09/17/2020   Hepatic steatosis 12/22/2019   Elevated glucose 12/22/2019   Cyst of pancreas 12/22/2019   Mid back pain on left side 12/15/2018   Medication refill 12/15/2018   Hypokalemia 12/15/2018   Healthcare maintenance 06/13/2018   Essential hypertension 09/03/2015  PCP: Libby Maw, MD   REFERRING PROVIDER: Dene Gentry, MD   REFERRING DIAG: lumbar radiculopathy  PRECAUTIONS: None   WEIGHT BEARING RESTRICTIONS No  PERTINENT HISTORY:  HTN  ONSET DATE: chronic    Subjective:  1/10 pain today.  I did steps and squats on Friday and I am little sore. I think I can [facility] a local [facility]  I am doing better. PAIN:  Are you having pain? yes NPRS scale: 1/10 Pain location: L side low back Pain orientation: Left and Lower  PAIN  TYPE: sore Pain description: intermittent and aching  Aggravating factors: sitting too long Relieving factors: heating pad, takes OTC meds. Lying down   OBJECTIVE:    DIAGNOSTIC FINDINGS:  2020:Vertebral body height is well maintained. Mild osteophytic changes are noted. Mild S-shaped scoliosis in the midthoracic spine is noted. No paraspinal mass is seen.   IMPRESSION: Degenerative change and mild scoliosis.     PATIENT SURVEYS:  Intake FOTO 59%; Status (6th visit) 67%     COGNITION:          Overall cognitive status: Within functional limits for tasks assessed                        SENSATION:          Light touch: Appears intact          Stereognosis: Appears intact           Hot/Cold: Appears intact          Proprioception: Appears intact   MUSCLE LENGTH: Hamstrings: tight Quads: tight   POSTURE:  Lumbar lordosis, stands with trunk flexed                      PALPATION: Pain L side of trunk, thoracolumbar spine and  into lateral sacral border, gluteals L side    LUMBARAROM/PROM   A/PROM A/PROM  07/28/2021  Flexion Mid shin LLE tightness  Extension WFL  Right lateral flexion WFL  Left lateral flexion WFL  Right rotation [facility]  Left rotation WFL    (Blank rows = not tested)   LE MMT:   A/PROM Right 07/28/2021 Left 07/28/2021 Right 08/15/21 Left 08/15/21  Hip flexion 4+/5 4+/5    Hip extension 4+/5 4-/5    Hip abduction   3+/5 4-/5 4/5  Hip adduction        Hip internal rotation        Hip external rotation        Knee flexion 5/5 5/5    Knee extension 5/5 5/5    Ankle dorsiflexion        Ankle plantarflexion        Ankle inversion        Ankle eversion         (Blank rows = not tested)   LE AROM   ROM Right 07/28/2021 Left 07/28/2021  Hip flexion      Hip extension      Hip abduction      Hip adduction      Hip internal rotation      Hip external rotation      Knee flexion      Knee extension      Ankle dorsiflexion      Ankle  plantarflexion      Ankle inversion      Ankle eversion       (Blank rows = not tested)   LUMBAR SPECIAL TESTS:  Straight leg raise test: Positive   FUNCTIONAL TESTS:  5 times sit to stand: 17.4 sec   GAIT: Distance walked: 100 Assistive device utilized: None Level of assistance: Complete Independence Comments: develops min limp L LE  [facility] Adult PT Treatment:                                                DATE: 08-27-21 Aquatic Therapy Aquatic therapy at [address] - therapeutic pool temp 90 degrees  Pt enters aquatic pool without AD. Treatment took place in water 3.8 to  4 ft 8 in.feet deep depending upon activity.  Pt entered and exited the pool via stair and handrails independently   Walking forward, backward and side stepping 5 widths each (15 ft width) Pt went over laminated HEP for initial part of session  Access Code: [email or web address]  Exercises- pt educated and return demo of following Cat Cow in [place]allow [place] with Pool Noodle  Heel Toe Raises at [employer] in [place] with Pool Noodle - Lunge to Target at [employer] and Backward Walking Lunge in Shallow Water Hip Flexor Stretch with Noodle  Squat  Gastroc Stretch with Foot at Wall  Abdominal Curls Diagonal with Upper Extremity Flotation     Modified to jumping jacks in deep water and jump tucks   On edge of pool with bil UE support LE exercises  Hip abd/add R/Leach and then using 1 UE support Hip ext/flex with knee straight  pt needing VC and TC for correct execution and sequencing Runners stretch on R and L 30 sec x 2 and then stretch in to runners hamstring stretch 2 x  30 sec R and L Figure 4 squat stretch with UE support for R and L x 60 sec each  Supine abdominal hollowing with VC to keep hips up and feet up with  yellow barbells in UE submerged holding for 15 sec to 30 sec SLS stance 30 sec hold x 5 L and R LAD of Left leg in standing/ Aquastretch of Low back  left      Pt requires the buoyancy of water for active assisted exercises with buoyancy supported for strengthening and AROM exercises: PT  requires the viscosity of the water for resistance with strengthening exercises Water will allow for  reduced joint loading through buoyancy to help patient improve posture without excess stress and pain   [facility] Adult PT Treatment:                                                DATE: 08/22/21 Therapeutic Exercise: Recumbent bike 6 min L2  HEP review and perform Post pelvic tilt to bridge x 10  Lower trunk rotation Knee to chest  Seated physio ball exercises Pelvic tilt x 10 , lateral and A/P Arm flex, heel raises, opp arm opp LE Palloff press limited by shoulder pain , blue band x 10 each  Squats x 15  Lateral step ups x 10 with hip abd x 10  Childs pose forward an lateral bias x 3   Self Care: HEP modifications for standing ad seated core      [facility] Adult PT Treatment:                                                DATE: 08-20-21  Aquatic Therapy Aquatic therapy at [address] - therapeutic pool temp 87 degrees Pt enters aquatic pool area for 1st time. Treatment took place in water 3.8 to  4 ft 8 in.feet deep depending upon activity.  Pt entered and exited the pool via stair and handrails independently   Walking forward, backward and side stepping 5 widths each   On edge of pool with bil UE support LE exercises Hip abd/add R/Leach and then using 1 UE support Hip ext/flex with knee straight  pt needing VC and TC for correct execution and sequencing Marching knee/hip 90/90  Marching with added pool noodle for increased resistance  Ham curl R/L  Standing gastroc stretch with great toe flexed at pool wall R and L  Squats  Runners stretch on R and L 30 sec x 2 and then stretch in to runners hamstring stretch 2 x 30 sec R and L Figure 4 squat stretch with UE support for R and L x 60 sec each  Supine abdominal hollowing with  VC to keep hips up and feet up with  yellow barbells in UE submerged prone aquatic  plank with barbell submerged for abdominal engagement, difficulty keeping hip elevated SLS stance 30 sec hold x 5 L and R Aqua stretch over Left low back and Left hip with decrease in intensity of pain     Pt requires the buoyancy of water for active assisted exercises with buoyancy supported for strengthening and AROM exercises: PT  requires the viscosity of the water for resistance with strengthening exercises  Hydrostatic pressure also supports joints by unweighting joint load by at least 50 % in 3-4 feet depth water. 80% in chest to neck deep water. Water will allow for  reduced joint loading through buoyancy to help patient improve posture without excess stress and pain   [facility] Adult PT Treatment:                                                DATE: 08/15/21 Therapeutic Exercise: Nustep L5 Ue/Le x 5 min Tandem stance 60 sec each  Bilat heel raise Slant board stretch x 30 sec  SLS 16 sec R, 21 sec L Tandem stance on airex >30 sec Hooklying blue band clam Bridge with blue band clam-  Side hip abduction x 10 each  Cat/camel Childs pose with laterals  Prone quad stretch x 60 sec each  Supine hamstring stretch with strap x 60 sec each    [facility] Adult PT Treatment:                                                DATE: 08-13-21 Aquatic Therapy Aquatic therapy at [address] - therapeutic pool temp 87 degrees Pt enters aquatic pool area for 1st time. Treatment took place in water 3.8 to  4 ft 8 in.feet deep depending upon activity.  Pt entered and exited the pool via stair and handrails independently  Walking forward, backward and side stepping 5 widths each  On edge of pool with bil UE support LE exercises Hip abd/add R/Leach and then using 1 UE support Hip ext/flex with knee straight  pt needing VC and TC for correct execution and sequencing Marching knee/hip 90/90  Marching with added  pool noodle for increased resistance  Ham curl R/L  Standing gastroc stretch with great toe flexed at pool wall R and L  Squats  Runners stretch on R and L 30 sec x 2 and then stretch in to runners hamstring stretch 2 x 30 sec R and L Figure 4 squat stretch with UE support for R and L x 60 sec each  Supine abdominal hollowing with VC to keep hips up and feet up with  yellow barbells in UE submerged prone aquatic  plank with barbell submerged for abdominal engagement, difficulty keeping hip elevated Aqua stretch over Left low back and Left hip with decrease in intensity of pain    [facility] Adult PT Treatment:                                                DATE: 08/08/21 Therapeutic Exercise: Nustep L5 Ue/Le x 5 min Tandem stance Bilat heel raise Slant board stretch x 30 sec  SLS 23 sec R, 28 sec L QL stretch in doorway x 3 each way  Side lying Left QL stretch over rolled pillow Bridge x15 with initial PPT PPT with Bent knee raises Sidelying clam x 15 bilat red band added  Hooklying blue band clam Bridge with blue band clam- added to HEP  Cat/camel Childs pose with laterals  PATIENT EDUCATION:  Education details: FOTO status discussed with patient, community wellness / aquatic opportunities  Aquatic HEP Person educated: Patient Education method: Explanation, Media planner, Corporate treasurer cues, Verbal cues, and Handouts Education comprehension: verbalized understanding, returned demonstration, and needs further education     HOME EXERCISE PROGRAM:  Access Code: [email or web address] URL: [email or web address] Date: 08/27/2021 Prepared by: Voncille Lo  Access Code: [email or web address] URL: [email or web address] Date: 07/31/2021 Prepared by: Hessie Diener   Access Code: [email or web address] for aquatics URL: [email or web address] Date: 08/22/2021 Prepared by: Raeford Razor      ASSESSMENT:   CLINICAL IMPRESSION:   Patient doing well  with aquatics and  return demo of HEP and given laminated card for use on her own at local pool.  Pt will benefit from continuing progressive loading on land until DC. Pt requires the buoyancy of water for active assisted exercises with buoyancy supported for strengthening and AROM exercises: PT  requires the viscosity of the water for resistance with strengthening exercisesNext week she may either DC or consider continuing until end of POC. She is making good functional progress and is near target FOTO score.   REHAB POTENTIAL: Excellent   CLINICAL DECISION MAKING: Stable/uncomplicated   EVALUATION COMPLEXITY: Low     GOALS: LONG TERM GOALS:    LTG Name Target Date Goal status  1 Pt will be able to walk, stand for 30 min without L LE pain  Baseline: 09/08/2021 ongoing  2 Pt will be able to stand/balance  on LLE with improved stability, up to 15 sec Baseline: 09/08/2021 08/08/21 Met  3 Pt will be able to be I with HEP for core and hip strength  Baseline: 09/08/2021 In progress  4 Pt will be able to increase L hip strength to 4+/5 for support with gait, stairs  Baseline: 09/08/2021 In progress   5 Pt will improve FOTO score by 10% to demo improved functional mobility Baseline: 67% 09/08/2021 In progress   PLAN:  PT FREQUENCY: 2x/week   PT DURATION: 6 weeks   PLANNED INTERVENTIONS: Therapeutic exercises, Therapeutic activity, Neuro Muscular re-education, Balance training, Gait training, Patient/Family education, Joint mobilization, Dry Needling, Electrical stimulation, Cryotherapy, Moist heat, and Manual therapy   PLAN FOR NEXT SESSION: check new HEP.  Monitor shoulder pain . General core and LE strength .   Marland Kitchen Voncille Lo, PT, [place] Certified Exercise Expert for the Aging Adult  08/27/21 6:02 PM Phone: [phone] Fax: [phone]

## 2021-08-27 NOTE — Patient Instructions (Signed)
Access Code: [email or web address] URL: [email or web address] Date: 08/27/2021 Prepared by: Voncille Lo  Exercises Cat Cow in Shallow Water with Pool Noodle - 1 x daily - 1-3 x weekly - 1 sets - 5-10 reps Full Triangle Pose in Shallow Water with Pool Noodle - 1 x daily - 1-3 x weekly - 1 sets - 5 reps - 15-30 hold Lunge to Target at [employer] - 1 x daily - 1-3 x weekly - 2 sets - 10 reps Forward and Backward Walking Lunge in [place] - 1 x daily - 1-3 x weekly - 2 sets - 10 reps Hip Flexor Stretch with Noodle - 1 x daily - 7 x weekly - 3 sets - 10 reps Squat - 1 x daily - 7 x weekly - 3 sets - 10 reps Cat Cow in [place]allow Water with Pool Noodle - 1 x daily - 1-3 x weekly - 1 sets - 5-10 reps Gastroc Stretch with Foot at Wall - 1 x daily - 2 x weekly - 1 reps - 30 seconds hold Abdominal Curls Diagonal with Upper Extremity Flotation - 1 x daily - 7 x weekly - 3 sets - 10 reps Heel Toe Raises at [place] - 1 x daily - 7 x weekly - 3 sets - 10 reps  Voncille Lo, PT, [place] Certified Exercise Expert for the Aging Adult  08/27/21 3:13 PM Phone: [phone] Fax: [phone]

## 2021-08-28 ENCOUNTER — Encounter: Payer: Self-pay | Admitting: Physical Therapy

## 2021-08-29 ENCOUNTER — Ambulatory Visit: Payer: Medicare Other | Admitting: Physical Therapy

## 2021-08-29 ENCOUNTER — Other Ambulatory Visit: Payer: Self-pay

## 2021-08-29 DIAGNOSIS — R262 Difficulty in walking, not elsewhere classified: Secondary | ICD-10-CM

## 2021-08-29 DIAGNOSIS — M5416 Radiculopathy, lumbar region: Secondary | ICD-10-CM

## 2021-08-29 NOTE — Therapy (Signed)
OUTPATIENT PHYSICAL THERAPY TREATMENT NOTE/DISCHARGE   Patient Name: Tonya Bowers MRN: [id number] DOB:1951-07-03, 71 y.o., female Today's Date: 08/29/2021  PCP: Libby Maw, MD REFERRING PROVIDER: Dene Gentry, MD   PT End of Session - 08/29/21 1145     Visit Number 10    Number of Visits 12    Date for PT Re-Evaluation 09/08/21    Authorization Type [employer] Medicare    PT Start Time 1143    PT Stop Time 1230    PT Time Calculation (min) 47 min    Activity Tolerance Patient tolerated treatment well    Behavior During Therapy [facility] for tasks assessed/performed                 Past Medical History:  Diagnosis Date   Arthritis    Cataract    Diverticulosis    GERD (gastroesophageal reflux disease)    Hyperplastic colon polyp 09/09/06   Past Surgical History:  Procedure Laterality Date   ABDOMINAL HYSTERECTOMY     CHOLECYSTECTOMY     COLONOSCOPY     POLYPECTOMY     TUBAL LIGATION     Patient Active Problem List   Diagnosis Date Noted   Elevated LDL cholesterol level 07/03/2021   Need for pneumococcal vaccination 07/03/2021   Lumbar radiculopathy 07/03/2021   Medically noncompliant 04/01/2021   Seasonal allergic rhinitis due to pollen 09/17/2020   Hepatic steatosis 12/22/2019   Elevated glucose 12/22/2019   Cyst of pancreas 12/22/2019   Mid back pain on left side 12/15/2018   Medication refill 12/15/2018   Hypokalemia 12/15/2018   Healthcare maintenance 06/13/2018   Essential hypertension 09/03/2015  PCP: Libby Maw, MD   REFERRING PROVIDER: Dene Gentry, MD   REFERRING DIAG: lumbar radiculopathy  PRECAUTIONS: None   WEIGHT BEARING RESTRICTIONS No  PERTINENT HISTORY:  HTN  ONSET DATE: chronic    Subjective:    Patient doing well, enjoyed the pool.  [facility] and [facility] machines, walk more. She feels ready for discharge from PT      PAIN:  Are you having pain? yes NPRS  scale: 1/10 Pain location: L side low back Pain orientation: Left and Lower  PAIN TYPE: sore Pain description: intermittent and aching  Aggravating factors: sitting too long Relieving factors: heating pad, takes OTC meds. Lying down   OBJECTIVE:    DIAGNOSTIC FINDINGS:  2020:Vertebral body height is well maintained. Mild osteophytic changes are noted. Mild S-shaped scoliosis in the midthoracic spine is noted. No paraspinal mass is seen.   IMPRESSION: Degenerative change and mild scoliosis.     PATIENT SURVEYS:  Intake FOTO 59%; Status (6th visit) 67%, 73% DC     COGNITION:          Overall cognitive status: Within functional limits for tasks assessed                        SENSATION:          Light touch: Appears intact          Stereognosis: Appears intact           Hot/Cold: Appears intact          Proprioception: Appears intact   MUSCLE LENGTH: Hamstrings: tight Quads: tight   POSTURE:  Lumbar lordosis, stands with trunk flexed  PALPATION: Pain L side of trunk, thoracolumbar spine and into lateral sacral border, gluteals L side    LUMBARAROM/PROM   A/PROM A/PROM  07/28/2021  Flexion Mid shin LLE tightness  Extension WFL  Right lateral flexion WFL  Left lateral flexion WFL  Right rotation [facility]  Left rotation WFL    (Blank rows = not tested)   LE MMT:   A/PROM Right 07/28/2021 Left 07/28/2021 Right 08/15/21 Left 08/15/21 Rt  Lt, 4+/5  Hip flexion 4+/5 4+/5      Hip extension 4+/5 4-/5      Hip abduction   3+/5 4-/5 4/5 4+/5 4+/5  Hip adduction          Hip internal rotation          Hip external rotation          Knee flexion 5/5 5/5      Knee extension 5/5 5/5      Ankle dorsiflexion          Ankle plantarflexion          Ankle inversion          Ankle eversion           (Blank rows = not tested)   LUMBAR SPECIAL TESTS:  Straight leg raise test: Positive   FUNCTIONAL TESTS:  5 times sit to stand: 17.4 sec 08/29/21: 12.8  sec    GAIT: Distance walked: 100 Assistive device utilized: None Level of assistance: Complete Independence Comments: develops min limp L LE   [facility] Adult PT Treatment:                                                DATE: 08/29/21 Therapeutic Exercise: Nustep warm up 6 min L8 UE and LE  Cat Cow to Child's Pose x 5 Child's Pose with Sidebending Hooklying x 2 to each side   Single Knee to Chest Stretch - 2 x daily - 7 x weekly - 1-2 sets - 5 reps - 30 hold Supine Lower Trunk Rotation - 2 x daily - 7 x weekly - 1-2 sets - 10 reps - 10 hold Pelvic tilt to bridge x 10  Verbal review of other HEP: Alternating Shoulder Flexion Seated on Swiss Ball - 1 x daily - 7 x weekly - 2 sets - 10 reps - 3 hold Swiss Ball March - 1 x daily - 7 x weekly - 2 sets - 10 reps - 3 hold Standing Anti-Rotation Press with Anchored Resistance - 1 x daily - 7 x weekly - 2 sets - 10 reps - 3 hold Sidelying hip abd x 10 each LE  Shoulder extension green band x 10 Shoulder row green TB x 15  Palloff press x 15 green band     [facility] Adult PT Treatment:                                                DATE: 08-27-21 Aquatic Therapy Aquatic therapy at [address] - therapeutic pool temp 90 degrees  Pt enters aquatic pool without AD. Treatment took place in water 3.8 to  4 ft 8 in.feet deep depending upon activity.  Pt entered and exited the pool via stair  and handrails independently   Walking forward, backward and side stepping 5 widths each (15 ft width) Pt went over laminated HEP for initial part of session  Access Code: [email or web address]  Exercises- pt educated and return demo of following Cat Cow in [place] with Pool Noodle  Heel Toe Raises at [place] in [place] with Pool Noodle - Lunge to Target at [employer] and Backward Walking Lunge in [place] Hip Flexor Stretch with Noodle  Squat  Gastroc Stretch with Foot at Wall  Abdominal Curls Diagonal with  Upper Extremity Flotation     Modified to jumping jacks in deep water and jump tucks   On edge of pool with bil UE support LE exercises  Hip abd/add R/Leach and then using 1 UE support Hip ext/flex with knee straight  pt needing VC and TC for correct execution and sequencing Runners stretch on R and L 30 sec x 2 and then stretch in to runners hamstring stretch 2 x 30 sec R and L Figure 4 squat stretch with UE support for R and L x 60 sec each  Supine abdominal hollowing with VC to keep hips up and feet up with  yellow barbells in UE submerged holding for 15 sec to 30 sec SLS stance 30 sec hold x 5 L and R LAD of Left leg in standing/ Aquastretch of Low back left      Pt requires the buoyancy of water for active assisted exercises with buoyancy supported for strengthening and AROM exercises: PT  requires the viscosity of the water for resistance with strengthening exercises Water will allow for  reduced joint loading through buoyancy to help patient improve posture without excess stress and pain   [facility] Adult PT Treatment:                                                DATE: 08/22/21 Therapeutic Exercise: Recumbent bike 6 min L2  HEP review and perform Post pelvic tilt to bridge x 10  Lower trunk rotation Knee to chest  Seated physio ball exercises Pelvic tilt x 10 , lateral and A/P Arm flex, heel raises, opp arm opp LE Palloff press limited by shoulder pain , blue band x 10 each  Squats x 15  Lateral step ups x 10 with hip abd x 10  Childs pose forward an lateral bias x 3   Self Care: HEP modifications for standing ad seated core           PATIENT EDUCATION:  Education details: FOTO results, HEP, finalizing PT  Person educated: Patient Education method: Explanation, Demonstration, Tactile cues, Verbal cues, and Handouts Education comprehension: verbalized understanding, returned demonstration, and needs further education     HOME EXERCISE PROGRAM:  Access Code:  [email or web address] URL: [email or web address] Date: 08/27/2021 Prepared by: Voncille Lo  Access Code: [email or web address] URL: [email or web address] Date: 07/31/2021 Prepared by: Hessie Diener   Access Code: [email or web address] for aquatics URL: [email or web address] Date: 08/22/2021 Prepared by: Raeford Razor      ASSESSMENT:   CLINICAL IMPRESSION: Patient is DC from therapy as she has met her goals and feels ready to discharge.  She has min increase in low back pain with standing but it is relieved with sitting and stretching .  FOTO score surpassed her goal by 5%.   REHAB POTENTIAL: Excellent  CLINICAL DECISION MAKING: Stable/uncomplicated   EVALUATION COMPLEXITY: Low     GOALS: LONG TERM GOALS:    LTG Name Target Date Goal status  1 Pt will be able to walk, stand for 30 min without L LE pain  Baseline: 09/08/2021 met  2 Pt will be able to stand/balance  on LLE with improved stability, up to 15 sec Baseline: 09/08/2021  Met  3 Pt will be able to be I with HEP for core and hip strength  Baseline: 09/08/2021  Met    4 Pt will be able to increase L hip strength to 4+/5 for support with gait, stairs  Baseline: 09/08/2021  met  5 Pt will improve FOTO score by 10% to demo improved functional mobility Baseline: 67% 09/08/2021 met   PLAN:  PT FREQUENCY: 2x/week   PT DURATION: 6 weeks   PLANNED INTERVENTIONS: Therapeutic exercises, Therapeutic activity, Neuro Muscular re-education, Balance training, Gait training, Patient/Family education, Joint mobilization, Dry Needling, Electrical stimulation, Cryotherapy, Moist heat, and Manual therapy   PLAN FOR NEXT SESSION:  NA/DC Raeford Razor, PT 08/29/21 12:22 PM Phone: [phone] Fax: [phone]   PHYSICAL THERAPY DISCHARGE SUMMARY  Visits from Start of Care: 10  Current functional level related to goals / functional outcomes: See below    Remaining deficits: Min back pain    Education /  Equipment: HEP  and body mechanics, posture    Patient agrees to discharge. Patient goals were met. Patient is being discharged due to meeting the stated rehab goals.   Raeford Razor, PT 08/29/21 12:27 PM Phone: [phone] Fax: [phone]

## 2021-09-30 ENCOUNTER — Other Ambulatory Visit: Payer: Self-pay | Admitting: Family Medicine

## 2021-10-01 ENCOUNTER — Encounter: Payer: Self-pay | Admitting: Family Medicine

## 2021-10-01 ENCOUNTER — Ambulatory Visit: Payer: Medicare Other | Admitting: Family Medicine

## 2021-10-01 VITALS — BP 146/73 | Ht 65.5 in | Wt 235.0 lb

## 2021-10-01 DIAGNOSIS — M25511 Pain in right shoulder: Secondary | ICD-10-CM | POA: Diagnosis not present

## 2021-10-01 DIAGNOSIS — M5416 Radiculopathy, lumbar region: Secondary | ICD-10-CM

## 2021-10-01 NOTE — Patient Instructions (Signed)
We will go ahead with an MRI of your lumbar spine as you're not improving as well as expected with physical therapy, home exercises, the meloxicam. ?Consider a steroid injection for your right shoulder as next step. ?We could do MRI or ultrasound of this shoulder too but it's unlikely you have something surgical here. ?Do home exercises daily, be more diligent about these. ?Icing 15 minutes at a time as needed. ?I will call you with the MRI results and next steps. ?

## 2021-10-01 NOTE — Progress Notes (Signed)
PCP: Libby Maw, MD ? ?Subjective:  ? ?HPI: ?Patient is a 71 y.o. female here for low back pain. ? ?1/11: ?Patient reports she's had off and on low back pain for a long time. ?Current pain in low back worse the past 1-2 weeks. ?Associated with radiation into the left leg to about the knee. ?No numbness. ?No bowel/bladder dysfunction. ?Has only tried extra strength tylenol for this. ? ?2/15: ?Patient's back is improving with physical therapy. ?No radiation into leg any longer. ?Feels some pain with sitting over an hour but this is improving. ?Home stretches have helped also. ?Taking meloxicam. ?No numbness/tingling. ?Reports pain also in her right shoulder has persisted since last visit. ?Pain lateral and radiates down arm to about the forearm. ?No neck pain, numbness/tingling. ?Worse with arm motions. ?Is right handed. ? ?3/29: ?Patient reports she feels about the same compared to last visit. ?Back improvement has stalled. ?Doing home exercises and has done physical therapy. ?No radiation down leg. ?No numbness/tingling. ?Pain more on left side of low back. ?She continues to take meloxicam as needed. ?Pain right shoulder unchanged, worse with overhead motions and reaching. ?No neck pain. ?Nitro patches seemed to exacerbate this so stopped. ? ?Past Medical History:  ?Diagnosis Date  ? Arthritis   ? Cataract   ? Diverticulosis   ? GERD (gastroesophageal reflux disease)   ? Hyperplastic colon polyp 09/09/06  ? ? ?Current Outpatient Medications on File Prior to Visit  ?Medication Sig Dispense Refill  ? calcium carbonate (OSCAL) 1500 (600 Ca) MG TABS tablet Take 600 mg of elemental calcium by mouth 2 (two) times daily with a meal.    ? cholecalciferol (VITAMIN D3) 25 MCG (1000 UNIT) tablet Take 1,000 Units by mouth daily.    ? estradiol (ESTRACE) 0.5 MG tablet Take 0.5 mg by mouth daily.  2  ? meloxicam (MOBIC) 15 MG tablet Take 1 tablet (15 mg total) by mouth daily. 30 tablet 1  ? Multiple Vitamins-Minerals  (MULTIVITAMIN WITH MINERALS) tablet Take 1 tablet by mouth daily.    ? nitroGLYCERIN (NITRODUR - DOSED IN MG/24 HR) 0.2 mg/hr patch Apply 1/4th patch to affected shoulder, change daily 30 patch 1  ? pravastatin (PRAVACHOL) 20 MG tablet Take 1 tablet (20 mg total) by mouth at bedtime. 90 tablet 1  ? spironolactone (ALDACTONE) 25 MG tablet Take 1 tablet (25 mg total) by mouth daily. 90 tablet 1  ? ?No current facility-administered medications on file prior to visit.  ? ? ?Past Surgical History:  ?Procedure Laterality Date  ? ABDOMINAL HYSTERECTOMY    ? CHOLECYSTECTOMY    ? COLONOSCOPY    ? POLYPECTOMY    ? TUBAL LIGATION    ? ? ?Allergies  ?Allergen Reactions  ? Lisinopril Cough  ? Penicillins   ? ? ?BP (!) 146/73   Ht 5' 5.5" (1.664 m)   Wt 235 lb (106.6 kg)   LMP 07/07/1995   BMI 38.51 kg/m?  ? ?   ? View : No data to display.  ?  ?  ?  ? ? ?   ? View : No data to display.  ?  ?  ?  ? ? ?    ?Objective:  ?Physical Exam: ? ?Gen: NAD, comfortable in exam room ? ?Right shoulder: ?No swelling, ecchymoses.  No gross deformity. ?No TTP AC joint, biceps tendon. ?FROM. ?Positive Hawkins, Neers. ?Strength 5/5 with empty can and resisted internal/external rotation.  Mild pain empty can. ?NV intact distally. ? ?Back: ?  No gross deformity, scoliosis. ?TTP mildly left lumbar paraspinal region.  No midline or bony TTP. ?FROM. ?Strength LEs 5/5 all muscle groups.   ?Negative SLRs. ?Sensation intact to light touch bilaterally. ?  ?Assessment & Plan:  ?1. Right shoulder pain - offered injection which she declined today - she wants to do her home exercises more diligently for this.  Consider injection in future though.  Icing if needed.  Meloxicam if needed. ? ?2. Low back pain - initial improvement of left lumbar radiculopathy but this has stalled.  Has done formal physical therapy, home exercises, meloxicam.  Will proceed with MRI lumbar spine. ?

## 2021-10-03 ENCOUNTER — Ambulatory Visit
Admission: RE | Admit: 2021-10-03 | Discharge: 2021-10-03 | Disposition: A | Discharge status: Home / Self Care | Payer: Medicare Other | Source: Ambulatory Visit | Attending: Family Medicine | Admitting: Family Medicine

## 2021-10-03 DIAGNOSIS — M5416 Radiculopathy, lumbar region: Secondary | ICD-10-CM

## 2021-10-03 DIAGNOSIS — M545 Low back pain, unspecified: Secondary | ICD-10-CM | POA: Diagnosis not present

## 2021-10-03 DIAGNOSIS — M48061 Spinal stenosis, lumbar region without neurogenic claudication: Secondary | ICD-10-CM | POA: Diagnosis not present

## 2021-10-03 IMAGING — MR MR LUMBAR SPINE W/O CM
4 of 5 series · 27 of 48 positions shown · non-contrast
Comparison: None.

CLINICAL DATA: Left side low back pain radiating into the left hip
and leg for a few months.

EXAM:
MRI LUMBAR SPINE WITHOUT CONTRAST
TECHNIQUE: Multiplanar, multisequence MR imaging of the lumbar spine was
performed. No intravenous contrast was administered.

[Series 3: T2 · sagittal · 4.0mm · 1.09mm/px · 6 of 17 slices shown (1 of 2)]
[im 1/17]
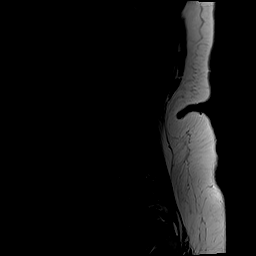
[im 4/17]
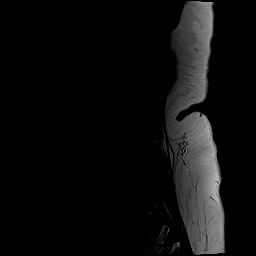
[im 7/17]
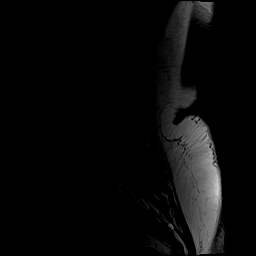
[im 10/17]
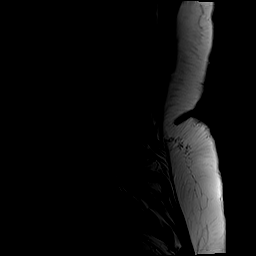
[im 13/17]
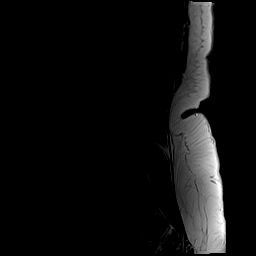
[im 17/17]
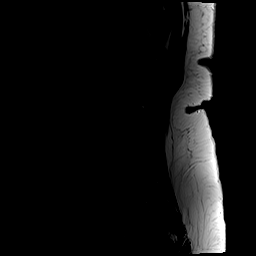

[Series 5: T1 · sagittal · 4.0mm · 1.09mm/px · 6 of 17 slices shown (1 of 2)]
[im 1/17]
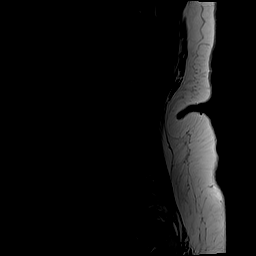
[im 4/17]
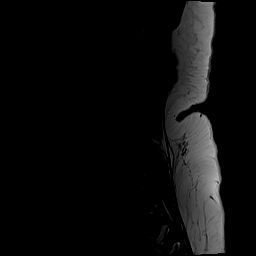
[im 7/17]
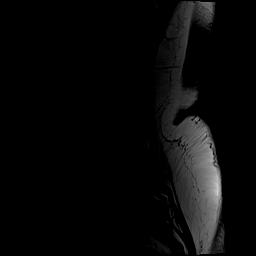
[im 10/17]
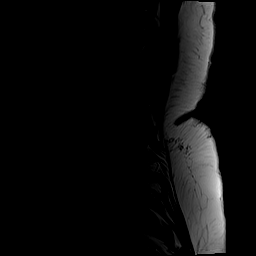
[im 13/17]
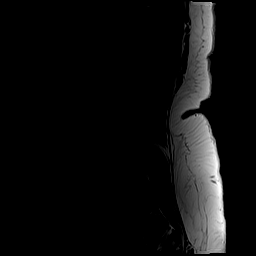
[im 17/17]
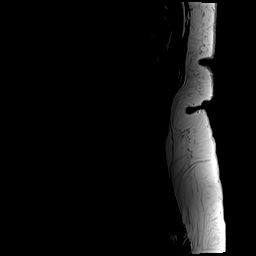

[Series 6: T2 · axial · 4.0mm · 0.39mm/px · z∈[-49,+163]mm · 9 of 42 slices shown (2 of 2)]
[im 1/42]
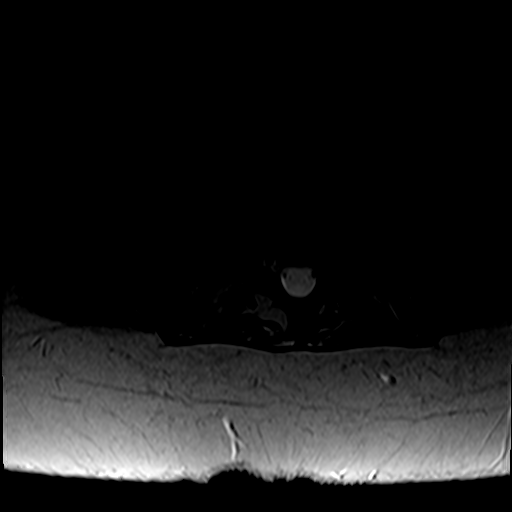
[im 6/42]
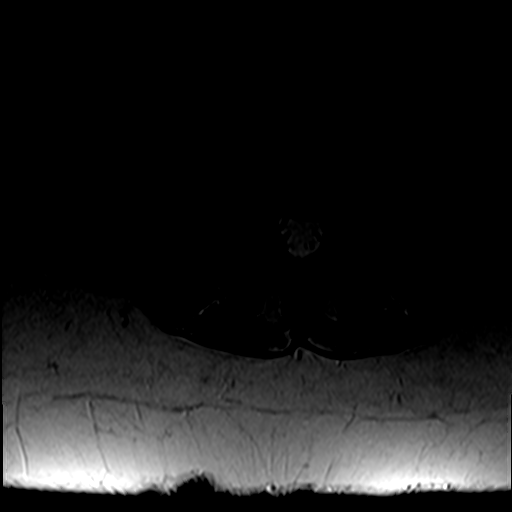
[im 12/42]
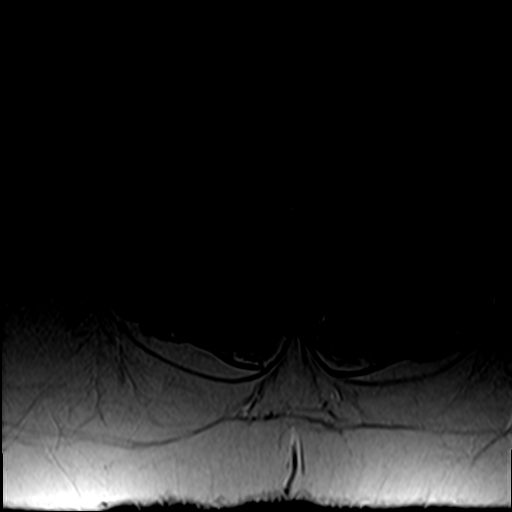
[im 18/42]
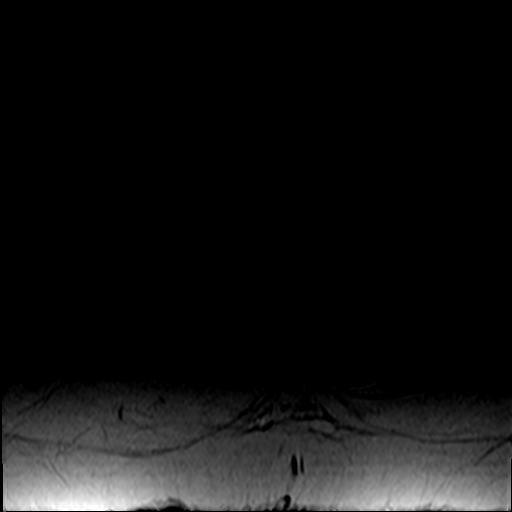
[im 21/42]
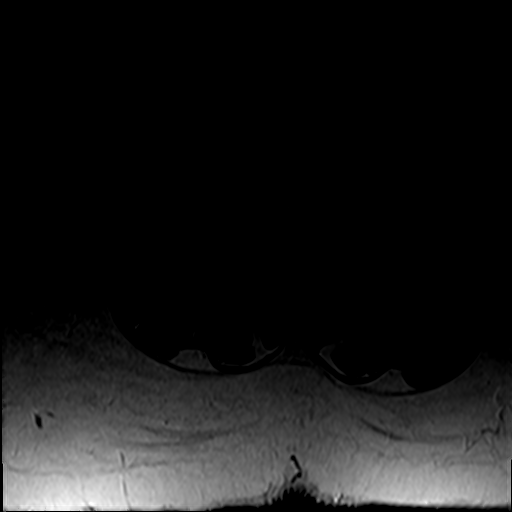
[im 24/42]
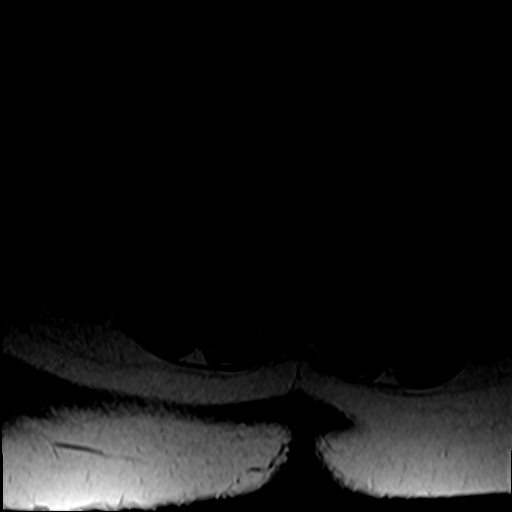
[im 30/42]
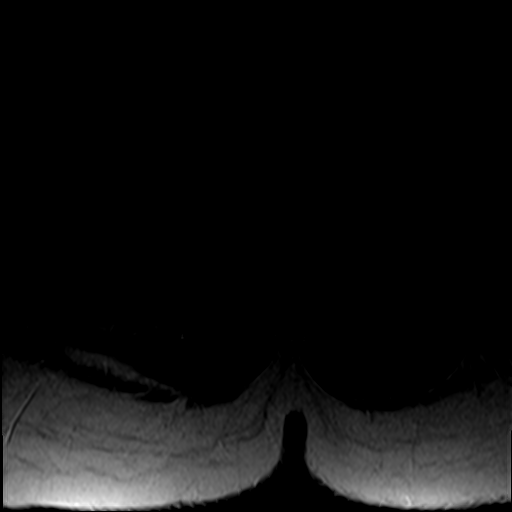
[im 36/42]
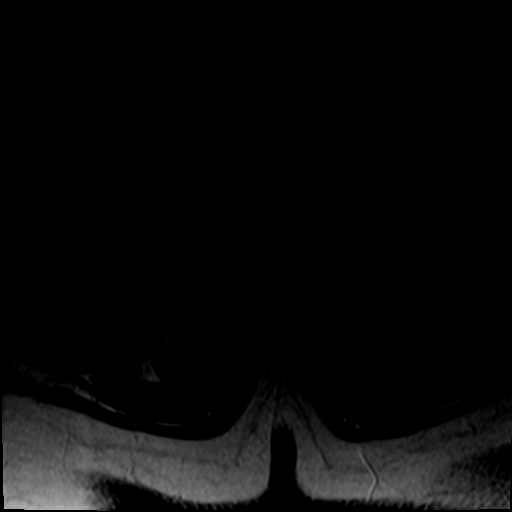
[im 42/42]
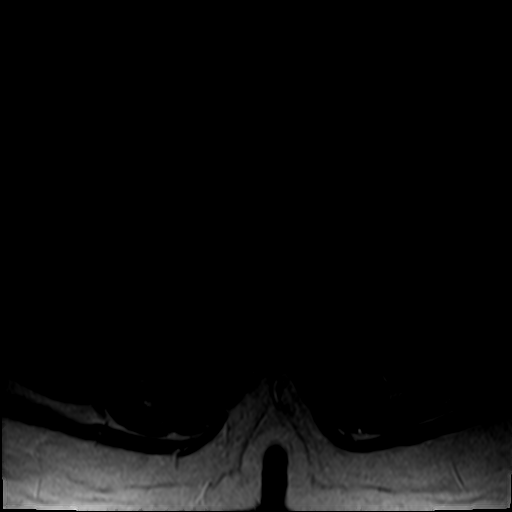

[Series 7: T1 · axial · 4.0mm · 0.39mm/px · z∈[-49,+135]mm · 6 of 42 slices shown (2 of 2)]
[im 1/42]
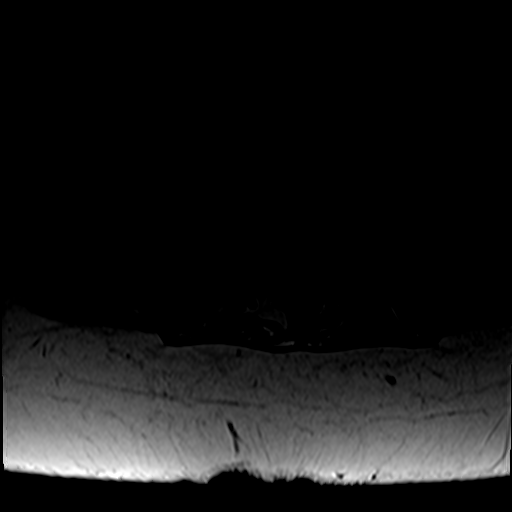
[im 6/42]
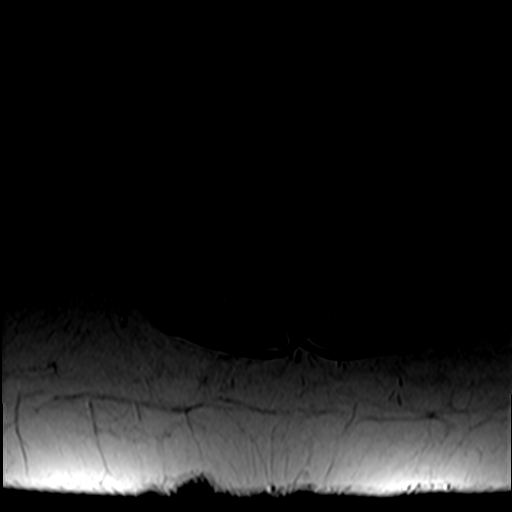
[im 12/42]
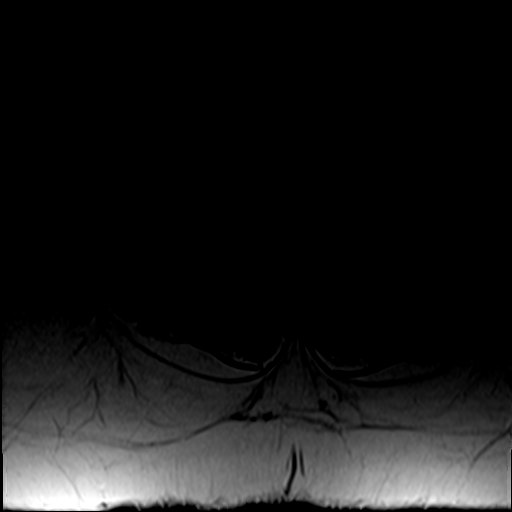
[im 18/42]
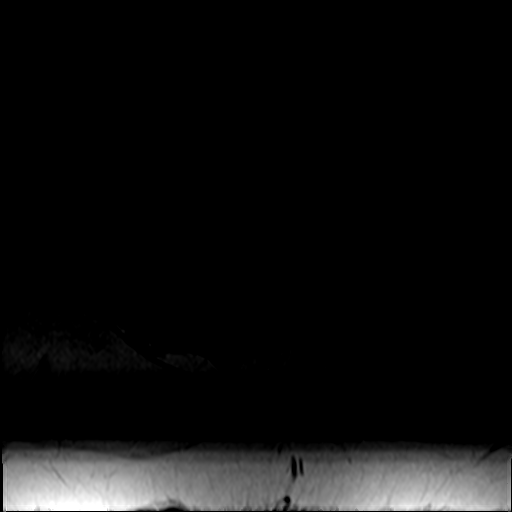
[im 21/42]
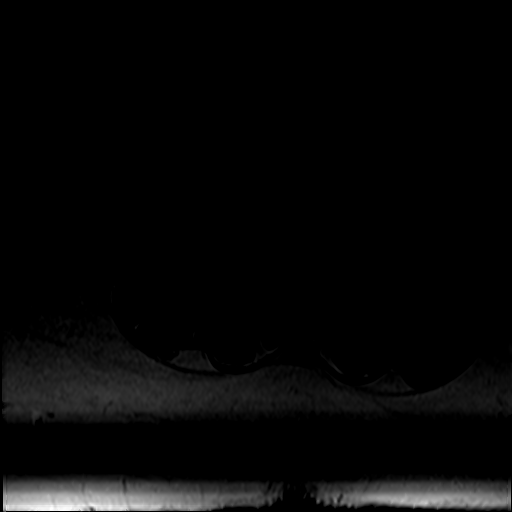
[im 36/42]
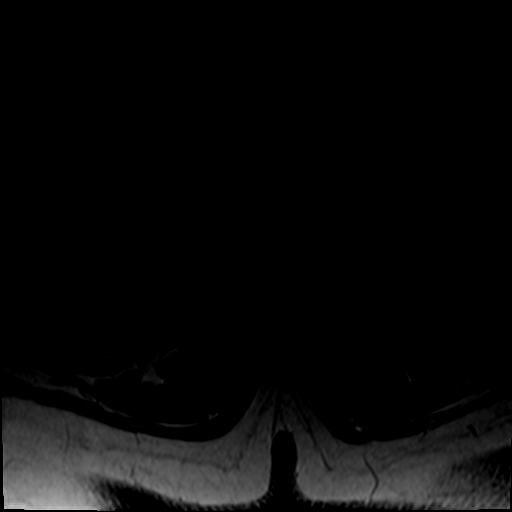

[27 of 48 positions shown; findings below may reference images not displayed]

FINDINGS: Segmentation:  Standard.

Alignment: Trace anterolisthesis L4 on L5 and L5 on S1 is due to
facet degenerative disease.

Vertebrae:  No fracture, evidence of discitis, or bone lesion.

Conus medullaris and cauda equina: Conus extends to the L1-2 level.
Conus and cauda equina appear normal.

Paraspinal and other soft tissues: Negative.

Disc levels:

T10-11 and T11-12 are imaged in the sagittal plane only. Patient
motion somewhat limits evaluation but T10-11 appears normal. There
is a shallow appearing left paracentral protrusion at T11-12 without
stenosis.

T12-L1: Negative.

L1-2: Negative.

L2-3: Negative.

L3-4: Mild-to-moderate facet degenerative change and a shallow disc
bulge. There is very mild central canal stenosis. The foramina are
open.

L4-5: Moderate facet degenerative disease. The disc is uncovered and
there is some ligamentum flavum thickening. Mild central canal
narrowing is present. Mild to moderate foraminal narrowing is more
notable on the left.

L5-S1: Moderate to moderately severe facet degenerative disease. The
disc is scratch there is slight disc uncovering without bulge. No
stenosis.
IMPRESSION: Spondylosis most notable at L4-5 where there is mild central canal
narrowing and left greater than right mild to moderate foraminal
stenosis.

Facet arthropathy L4-5 L5-S1 results in trace anterolisthesis at
both levels.

## 2021-10-29 ENCOUNTER — Ambulatory Visit: Payer: Medicare Other | Admitting: Family Medicine

## 2021-10-29 ENCOUNTER — Encounter: Payer: Self-pay | Admitting: Family Medicine

## 2021-10-29 ENCOUNTER — Ambulatory Visit: Payer: Self-pay

## 2021-10-29 VITALS — BP 153/65 | Ht 65.5 in | Wt 235.0 lb

## 2021-10-29 DIAGNOSIS — M25511 Pain in right shoulder: Secondary | ICD-10-CM | POA: Diagnosis not present

## 2021-10-29 DIAGNOSIS — M5416 Radiculopathy, lumbar region: Secondary | ICD-10-CM

## 2021-10-29 MED ORDER — METHYLPREDNISOLONE ACETATE 40 MG/ML IJ SUSP
40.0000 mg | Freq: Once | INTRAMUSCULAR | Status: AC
  Administered 2021-10-29: 40 mg via INTRA_ARTICULAR

## 2021-10-29 NOTE — Patient Instructions (Signed)
You were given an injection for the right shoulder. ?Wait a few days then you can restart the exercises for this. ?We will send in an order for the injection of your back. ?Call us about a week after you have this done to let us know how you're doing. ?Follow up in about 1 month for reevaluation. ?

## 2021-10-30 ENCOUNTER — Encounter: Payer: Self-pay | Admitting: Family Medicine

## 2021-10-30 NOTE — Progress Notes (Signed)
PCP: Libby Maw, MD ? ?Subjective:  ? ?HPI: ?Patient is a 71 y.o. female here for low back pain. ? ?1/11: ?Patient reports she's had off and on low back pain for a long time. ?Current pain in low back worse the past 1-2 weeks. ?Associated with radiation into the left leg to about the knee. ?No numbness. ?No bowel/bladder dysfunction. ?Has only tried extra strength tylenol for this. ? ?2/15: ?Patient's back is improving with physical therapy. ?No radiation into leg any longer. ?Feels some pain with sitting over an hour but this is improving. ?Home stretches have helped also. ?Taking meloxicam. ?No numbness/tingling. ?Reports pain also in her right shoulder has persisted since last visit. ?Pain lateral and radiates down arm to about the forearm. ?No neck pain, numbness/tingling. ?Worse with arm motions. ?Is right handed. ? ?3/29: ?Patient reports she feels about the same compared to last visit. ?Back improvement has stalled. ?Doing home exercises and has done physical therapy. ?No radiation down leg. ?No numbness/tingling. ?Pain more on left side of low back. ?She continues to take meloxicam as needed. ?Pain right shoulder unchanged, worse with overhead motions and reaching. ?No neck pain. ?Nitro patches seemed to exacerbate this so stopped. ? ?4/26: ?Patient again notes she feels about the same as last visit. ?Doing home exercises. ?Interested in pursuing injection right shoulder and her back. ? ?Past Medical History:  ?Diagnosis Date  ? Arthritis   ? Cataract   ? Diverticulosis   ? GERD (gastroesophageal reflux disease)   ? Hyperplastic colon polyp 09/09/06  ? ? ?Current Outpatient Medications on File Prior to Visit  ?Medication Sig Dispense Refill  ? calcium carbonate (OSCAL) 1500 (600 Ca) MG TABS tablet Take 600 mg of elemental calcium by mouth 2 (two) times daily with a meal.    ? cholecalciferol (VITAMIN D3) 25 MCG (1000 UNIT) tablet Take 1,000 Units by mouth daily.    ? estradiol (ESTRACE) 0.5 MG  tablet Take 0.5 mg by mouth daily.  2  ? meloxicam (MOBIC) 15 MG tablet Take 1 tablet (15 mg total) by mouth daily. 30 tablet 1  ? Multiple Vitamins-Minerals (MULTIVITAMIN WITH MINERALS) tablet Take 1 tablet by mouth daily.    ? nitroGLYCERIN (NITRODUR - DOSED IN MG/24 HR) 0.2 mg/hr patch Apply 1/4th patch to affected shoulder, change daily 30 patch 1  ? pravastatin (PRAVACHOL) 20 MG tablet Take 1 tablet (20 mg total) by mouth at bedtime. 90 tablet 1  ? spironolactone (ALDACTONE) 25 MG tablet Take 1 tablet (25 mg total) by mouth daily. 90 tablet 1  ? ?No current facility-administered medications on file prior to visit.  ? ? ?Past Surgical History:  ?Procedure Laterality Date  ? ABDOMINAL HYSTERECTOMY    ? CHOLECYSTECTOMY    ? COLONOSCOPY    ? POLYPECTOMY    ? TUBAL LIGATION    ? ? ?Allergies  ?Allergen Reactions  ? Lisinopril Cough  ? Penicillins   ? ? ?BP (!) 153/65   Ht 5' 5.5" (1.664 m)   Wt 235 lb (106.6 kg)   LMP 07/07/1995   BMI 38.51 kg/m?  ? ?   ? View : No data to display.  ?  ?  ?  ? ? ?   ? View : No data to display.  ?  ?  ?  ? ? ?    ?Objective:  ?Physical Exam: ? ?Gen: NAD, comfortable in exam room ? ?Right shoulder: ?No swelling, ecchymoses.  No gross deformity. ?No TTP. ?FROM. ?Positive  Wynonia Musty. ?Strength 5/5 with empty can and resisted internal/external rotation. Pain empty can. ?NV intact distally. ? ?Back: ?No gross deformity, scoliosis. ?TTP mildly left paraspinal lumbar region.  No midline or bony TTP. ?Strength LEs 5/5 all muscle groups.   ?Negative SLRs. ?  ?Assessment & Plan:  ?1. Right shoulder pain - Continue her home exercises.  Ultrasound guided subacromial injection given today.  Meloxicam, icing if needed.  Consider formal physical therapy if not improving. ? ?After informed written consent timeout was performed, patient was seated in chair in exam room. Right shoulder was prepped with alcohol swab and utilizing lateral approach with ultrasound guidance, patient's right  subacromial space was injected with 3:1 lidocaine: depomedrol. Patient tolerated the procedure well without immediate complications. ? ?2. Low back pain - mild-mod foraminal narrowing noted at L4-5 on the left.  Not improving with PT, home exercises, meloxicam.  Will refer for ESI vs facet injection at this level.  Let us know how she's doing a week after the injection. ?

## 2021-11-07 ENCOUNTER — Other Ambulatory Visit: Payer: Self-pay | Admitting: Family Medicine

## 2021-11-07 DIAGNOSIS — I10 Essential (primary) hypertension: Secondary | ICD-10-CM

## 2021-11-25 ENCOUNTER — Other Ambulatory Visit: Payer: Self-pay | Admitting: Family Medicine

## 2021-11-25 DIAGNOSIS — E7800 Pure hypercholesterolemia, unspecified: Secondary | ICD-10-CM

## 2021-11-26 ENCOUNTER — Ambulatory Visit: Payer: Medicare Other | Admitting: Family Medicine

## 2021-11-26 VITALS — BP 155/59 | Ht 65.5 in | Wt 250.0 lb

## 2021-11-26 DIAGNOSIS — M5416 Radiculopathy, lumbar region: Secondary | ICD-10-CM | POA: Diagnosis not present

## 2021-11-26 DIAGNOSIS — M25511 Pain in right shoulder: Secondary | ICD-10-CM

## 2021-11-27 ENCOUNTER — Encounter: Payer: Self-pay | Admitting: Family Medicine

## 2021-11-27 NOTE — Progress Notes (Signed)
PCP: Libby Maw, MD  Subjective:   HPI: Patient is a 71 y.o. female here for right shoulder, low back pain.  1/11: Patient reports she's had off and on low back pain for a long time. Current pain in low back worse the past 1-2 weeks. Associated with radiation into the left leg to about the knee. No numbness. No bowel/bladder dysfunction. Has only tried extra strength tylenol for this.  2/15: Patient's back is improving with physical therapy. No radiation into leg any longer. Feels some pain with sitting over an hour but this is improving. Home stretches have helped also. Taking meloxicam. No numbness/tingling. Reports pain also in her right shoulder has persisted since last visit. Pain lateral and radiates down arm to about the forearm. No neck pain, numbness/tingling. Worse with arm motions. Is right handed.  3/29: Patient reports she feels about the same compared to last visit. Back improvement has stalled. Doing home exercises and has done physical therapy. No radiation down leg. No numbness/tingling. Pain more on left side of low back. She continues to take meloxicam as needed. Pain right shoulder unchanged, worse with overhead motions and reaching. No neck pain. Nitro patches seemed to exacerbate this so stopped.  4/26: Patient again notes she feels about the same as last visit. Doing home exercises. Interested in pursuing injection right shoulder and her back.  5/24: Patient reports her shoulder has improved since last visit with injection. No night pain. Does take tylenol in the evenings. Doing home exercises. Her low back is doing ok - did not get the injection in her low back as pain is tolerable - not interested in pursuing this right now.   Past Medical History:  Diagnosis Date   Arthritis    Cataract    Diverticulosis    GERD (gastroesophageal reflux disease)    Hyperplastic colon polyp 09/09/06    Current Outpatient Medications on  File Prior to Visit  Medication Sig Dispense Refill   calcium carbonate (OSCAL) 1500 (600 Ca) MG TABS tablet Take 600 mg of elemental calcium by mouth 2 (two) times daily with a meal.     cholecalciferol (VITAMIN D3) 25 MCG (1000 UNIT) tablet Take 1,000 Units by mouth daily.     estradiol (ESTRACE) 0.5 MG tablet Take 0.5 mg by mouth daily.  2   meloxicam (MOBIC) 15 MG tablet TAKE 1 TABLET BY MOUTH DAILY 60 tablet 5   Multiple Vitamins-Minerals (MULTIVITAMIN WITH MINERALS) tablet Take 1 tablet by mouth daily.     nitroGLYCERIN (NITRODUR - DOSED IN MG/24 HR) 0.2 mg/hr patch Apply 1/4th patch to affected shoulder, change daily 30 patch 1   pravastatin (PRAVACHOL) 20 MG tablet Take 1 tablet (20 mg total) by mouth at bedtime. 90 tablet 1   spironolactone (ALDACTONE) 25 MG tablet TAKE 1 TABLET BY MOUTH DAILY 30 tablet 2   No current facility-administered medications on file prior to visit.    Past Surgical History:  Procedure Laterality Date   ABDOMINAL HYSTERECTOMY     CHOLECYSTECTOMY     COLONOSCOPY     POLYPECTOMY     TUBAL LIGATION      Allergies  Allergen Reactions   Lisinopril Cough   Penicillins     BP (!) 155/59   Ht 5' 5.5" (1.664 m)   Wt 250 lb (113.4 kg)   LMP 07/07/1995   BMI 40.97 kg/m       View : No data to display.  View : No data to display.              Objective:  Physical Exam:  Gen: NAD, comfortable in exam room  Right shoulder: No swelling, ecchymoses.  No gross deformity. No TTP. FROM. Negative Hawkins, Neers. Negative Yergasons. Strength 5/5 with empty can and resisted internal/external rotation. NV intact distally.   Assessment & Plan:  1. Right shoulder pain - improved following subacromial injection, home exercise program.  Continue the exercises.  Consider formal physical therapy if she doesn't continue to do well.  2. Low back pain - mild-mod foraminal narrowing at L4-5 on left.  Overall doing ok with this.  Has  done physical therapy, home exercises, meloxicam.  She would like to wait on ESI vs facet injection.  F/u prn.

## 2021-12-23 ENCOUNTER — Other Ambulatory Visit: Payer: Self-pay | Admitting: Family Medicine

## 2021-12-23 DIAGNOSIS — E7800 Pure hypercholesterolemia, unspecified: Secondary | ICD-10-CM

## 2022-01-17 ENCOUNTER — Other Ambulatory Visit: Payer: Self-pay | Admitting: Family Medicine

## 2022-01-17 DIAGNOSIS — I10 Essential (primary) hypertension: Secondary | ICD-10-CM

## 2022-03-17 DIAGNOSIS — H0011 Chalazion right upper eyelid: Secondary | ICD-10-CM | POA: Diagnosis not present

## 2022-04-01 ENCOUNTER — Other Ambulatory Visit: Payer: Self-pay | Admitting: Family Medicine

## 2022-04-01 DIAGNOSIS — I10 Essential (primary) hypertension: Secondary | ICD-10-CM

## 2022-04-14 ENCOUNTER — Ambulatory Visit (INDEPENDENT_AMBULATORY_CARE_PROVIDER_SITE_OTHER): Payer: Medicare Other

## 2022-04-14 VITALS — Ht 65.0 in | Wt 235.0 lb

## 2022-04-14 DIAGNOSIS — Z0000 Encounter for general adult medical examination without abnormal findings: Secondary | ICD-10-CM | POA: Diagnosis not present

## 2022-04-14 NOTE — Patient Instructions (Signed)
Tonya Bowers , Thank you for taking time to come for your Medicare Wellness Visit. I appreciate your ongoing commitment to your health goals. Please review the following plan we discussed and let me know if I can assist you in the future.   These are the goals we discussed:  Goals      Patient Stated     Eating healthier, drinking more water  & being more active     Patient Stated     Would like to get under 200 lb        This is a list of the screening recommended for you and due dates:  Health Maintenance  Topic Date Due   Zoster (Shingles) Vaccine (1 of 2) Never done   COVID-19 Vaccine (5 - Pfizer series) 01/24/2021   Flu Shot  02/03/2022   Mammogram  04/01/2022   Tetanus Vaccine  06/10/2026   Colon Cancer Screening  10/31/2026   Pneumonia Vaccine  Completed   DEXA scan (bone density measurement)  Completed   Hepatitis C Screening: USPSTF Recommendation to screen - Ages 54-79 yo.  Completed   HPV Vaccine  Aged Out    Advanced directives: Advance directive discussed with you today. I have provided a copy for you to complete at home and have notarized. Once this is complete please bring a copy in to our office so we can scan it into your chart.   Conditions/risks identified: Aim for 30 minutes of exercise or brisk walking, 6-8 glasses of water, and 5 servings of fruits and vegetables each day.   Next appointment: Follow up in one year for your annual wellness visit    Preventive Care 65 Years and Older, Female Preventive care refers to lifestyle choices and visits with your health care provider that can promote health and wellness. What does preventive care include? A yearly physical exam. This is also called an annual well check. Dental exams once or twice a year. Routine eye exams. Ask your health care provider how often you should have your eyes checked. Personal lifestyle choices, including: Daily care of your teeth and gums. Regular physical activity. Eating a healthy  diet. Avoiding tobacco and drug use. Limiting alcohol use. Practicing safe sex. Taking low-dose aspirin every day. Taking vitamin and mineral supplements as recommended by your health care provider. What happens during an annual well check? The services and screenings done by your health care provider during your annual well check will depend on your age, overall health, lifestyle risk factors, and family history of disease. Counseling  Your health care provider may ask you questions about your: Alcohol use. Tobacco use. Drug use. Emotional well-being. Home and relationship well-being. Sexual activity. Eating habits. History of falls. Memory and ability to understand (cognition). Work and work Statistician. Reproductive health. Screening  You may have the following tests or measurements: Height, weight, and BMI. Blood pressure. Lipid and cholesterol levels. These may be checked every 5 years, or more frequently if you are over 74 years old. Skin check. Lung cancer screening. You may have this screening every year starting at age 84 if you have a 30-pack-year history of smoking and currently smoke or have quit within the past 15 years. Fecal occult blood test (FOBT) of the stool. You may have this test every year starting at age 36. Flexible sigmoidoscopy or colonoscopy. You may have a sigmoidoscopy every 5 years or a colonoscopy every 10 years starting at age 51. Hepatitis C blood test. Hepatitis B blood test.  Sexually transmitted disease (STD) testing. Diabetes screening. This is done by checking your blood sugar (glucose) after you have not eaten for a while (fasting). You may have this done every 1-3 years. Bone density scan. This is done to screen for osteoporosis. You may have this done starting at age 70. Mammogram. This may be done every 1-2 years. Talk to your health care provider about how often you should have regular mammograms. Talk with your health care provider about  your test results, treatment options, and if necessary, the need for more tests. Vaccines  Your health care provider may recommend certain vaccines, such as: Influenza vaccine. This is recommended every year. Tetanus, diphtheria, and acellular pertussis (Tdap, Td) vaccine. You may need a Td booster every 10 years. Zoster vaccine. You may need this after age 40. Pneumococcal 13-valent conjugate (PCV13) vaccine. One dose is recommended after age 62. Pneumococcal polysaccharide (PPSV23) vaccine. One dose is recommended after age 1. Talk to your health care provider about which screenings and vaccines you need and how often you need them. This information is not intended to replace advice given to you by your health care provider. Make sure you discuss any questions you have with your health care provider. Document Released: 07/19/2015 Document Revised: 03/11/2016 Document Reviewed: 04/23/2015 Elsevier Interactive Patient Education  2017 [place] Prevention in the Home Falls can cause injuries. They can happen to people of all ages. There are many things you can do to make your home safe and to help prevent falls. What can I do on the outside of my home? Regularly fix the edges of walkways and driveways and fix any cracks. Remove anything that might make you trip as you walk through a door, such as a raised step or threshold. Trim any bushes or trees on the path to your home. Use bright outdoor lighting. Clear any walking paths of anything that might make someone trip, such as rocks or tools. Regularly check to see if handrails are loose or broken. Make sure that both sides of any steps have handrails. Any raised decks and porches should have guardrails on the edges. Have any leaves, snow, or ice cleared regularly. Use sand or salt on walking paths during winter. Clean up any spills in your garage right away. This includes oil or grease spills. What can I do in the bathroom? Use  night lights. Install grab bars by the toilet and in the tub and shower. Do not use towel bars as grab bars. Use non-skid mats or decals in the tub or shower. If you need to sit down in the shower, use a plastic, non-slip stool. Keep the floor dry. Clean up any water that spills on the floor as soon as it happens. Remove soap buildup in the tub or shower regularly. Attach bath mats securely with double-sided non-slip rug tape. Do not have throw rugs and other things on the floor that can make you trip. What can I do in the bedroom? Use night lights. Make sure that you have a light by your bed that is easy to reach. Do not use any sheets or blankets that are too big for your bed. They should not hang down onto the floor. Have a firm chair that has side arms. You can use this for support while you get dressed. Do not have throw rugs and other things on the floor that can make you trip. What can I do in the kitchen? Clean up any spills right away.  Avoid walking on wet floors. Keep items that you use a lot in easy-to-reach places. If you need to reach something above you, use a strong step stool that has a grab bar. Keep electrical cords out of the way. Do not use floor polish or wax that makes floors slippery. If you must use wax, use non-skid floor wax. Do not have throw rugs and other things on the floor that can make you trip. What can I do with my stairs? Do not leave any items on the stairs. Make sure that there are handrails on both sides of the stairs and use them. Fix handrails that are broken or loose. Make sure that handrails are as long as the stairways. Check any carpeting to make sure that it is firmly attached to the stairs. Fix any carpet that is loose or worn. Avoid having throw rugs at the top or bottom of the stairs. If you do have throw rugs, attach them to the floor with carpet tape. Make sure that you have a light switch at the top of the stairs and the bottom of the  stairs. If you do not have them, ask someone to add them for you. What else can I do to help prevent falls? Wear shoes that: Do not have high heels. Have rubber bottoms. Are comfortable and fit you well. Are closed at the toe. Do not wear sandals. If you use a stepladder: Make sure that it is fully opened. Do not climb a closed stepladder. Make sure that both sides of the stepladder are locked into place. Ask someone to hold it for you, if possible. Clearly mark and make sure that you can see: Any grab bars or handrails. First and last steps. Where the edge of each step is. Use tools that help you move around (mobility aids) if they are needed. These include: Canes. Walkers. Scooters. Crutches. Turn on the lights when you go into a dark area. Replace any light bulbs as soon as they burn out. Set up your furniture so you have a clear path. Avoid moving your furniture around. If any of your floors are uneven, fix them. If there are any pets around you, be aware of where they are. Review your medicines with your doctor. Some medicines can make you feel dizzy. This can increase your chance of falling. Ask your doctor what other things that you can do to help prevent falls. This information is not intended to replace advice given to you by your health care provider. Make sure you discuss any questions you have with your health care provider. Document Released: 04/18/2009 Document Revised: 11/28/2015 Document Reviewed: 07/27/2014 Elsevier Interactive Patient Education  2017 [employer].

## 2022-04-14 NOTE — Progress Notes (Signed)
Subjective:   Tonya Bowers is a 70 y.o. female who presents for Medicare Annual (Subsequent) preventive examination.   Virtual Visit via Telephone Note  I connected with  Tonya Bowers on 04/14/22 at  2:15 PM EDT by telephone and verified that I am speaking with the correct person using two identifiers.  Location: Patient: Home  Provider: [place]  Persons participating in the virtual visit: patient/Nurse Health Advisor   I discussed the limitations, risks, security and privacy concerns of performing an evaluation and management service by telephone and the availability of in person appointments. The patient expressed understanding and agreed to proceed.  Interactive audio and video telecommunications were attempted between this nurse and patient, however failed, due to patient having technical difficulties OR patient did not have access to video capability.  We continued and completed visit with audio only.  Some vital signs may be absent or patient reported.   Daphane Shepherd, LPN  Review of Systems    Cardiac Risk Factors include: advanced age (>16mn, >>55women);hypertension;dyslipidemia     Objective:    Today's Vitals   04/14/22 1414  Weight: 235 lb (106.6 kg)  Height: '5\' 5"'$  (1.651 m)   Body mass index is 39.11 kg/m.     04/14/2022    2:18 PM 07/28/2021    3:08 PM 04/08/2021   11:52 AM 03/06/2020    3:19 PM 10/30/2016    7:49 AM 10/16/2016    8:17 AM  Advanced Directives  Does Patient Have a Medical Advance Directive? Yes No No No No No  [place]       Copy of HHumphreysin Chart? No - copy requested       Would patient like information on creating a medical advance directive?  No - Patient declined No - Patient declined Yes (MAU/Ambulatory/Procedural Areas - Information given)      Current Medications (verified) Outpatient Encounter Medications as of 04/14/2022  Medication Sig    cholecalciferol (VITAMIN D3) 25 MCG (1000 UNIT) tablet Take 1,000 Units by mouth daily.   estradiol (ESTRACE) 0.5 MG tablet Take 0.5 mg by mouth daily.   Multiple Vitamins-Minerals (MULTIVITAMIN WITH MINERALS) tablet Take 1 tablet by mouth daily.   pravastatin (PRAVACHOL) 20 MG tablet TAKE 1 TABLET BY MOUTH AT  BEDTIME   spironolactone (ALDACTONE) 25 MG tablet TAKE 1 TABLET BY MOUTH DAILY   calcium carbonate (OSCAL) 1500 (600 Ca) MG TABS tablet Take 600 mg of elemental calcium by mouth 2 (two) times daily with a meal.   meloxicam (MOBIC) 15 MG tablet TAKE 1 TABLET BY MOUTH DAILY   nitroGLYCERIN (NITRODUR - DOSED IN MG/24 HR) 0.2 mg/hr patch Apply 1/4th patch to affected shoulder, change daily   No facility-administered encounter medications on file as of 04/14/2022.    Allergies (verified) Lisinopril and Penicillins   History: Past Medical History:  Diagnosis Date   Arthritis    Cataract    Diverticulosis    GERD (gastroesophageal reflux disease)    Hyperplastic colon polyp 09/09/06   Past Surgical History:  Procedure Laterality Date   ABDOMINAL HYSTERECTOMY     CHOLECYSTECTOMY     COLONOSCOPY     POLYPECTOMY     TUBAL LIGATION     Family History  Problem Relation Age of Onset   Hypertension Mother    Hypertension Father    Kidney disease Brother        kidney transplant  Hypertension Brother    Colon cancer Neg Hx    Social History   Socioeconomic History   Marital status: Married    Spouse name: Not on file   Number of children: Not on file   Years of education: Not on file   Highest education level: Not on file  Occupational History   Occupation: retired  Tobacco Use   Smoking status: Never   Smokeless tobacco: Never  Vaping Use   Vaping Use: Never used  Substance and Sexual Activity   Alcohol use: Yes    Comment: maybe occasionally; twice a year   Drug use: No   Sexual activity: Not on file  Other Topics Concern   Not on file  Social History  Narrative   Not on file   Social Determinants of Health   Financial Resource Strain: Low Risk  (04/14/2022)   Overall Financial Resource Strain (CARDIA)    Difficulty of Paying Living Expenses: Not hard at all  Food Insecurity: No Food Insecurity (04/14/2022)   Hunger Vital Sign    Worried About Running Out of Food in the Last Year: Never true    [place] in the Last Year: Never true  Transportation Needs: No Transportation Needs (04/14/2022)   PRAPARE - Hydrologist (Medical): No    Lack of Transportation (Non-Medical): No  Physical Activity: Insufficiently Active (04/14/2022)   Exercise Vital Sign    Days of Exercise per Week: 3 days    Minutes of Exercise per Session: 30 min  Stress: No Stress Concern Present (04/14/2022)   [place]    Feeling of Stress : Not at all  Social Connections: Moderately Integrated (04/14/2022)   Social Connection and Isolation Panel [NHANES]    Frequency of Communication with Friends and Family: More than three times a week    Frequency of Social Gatherings with Friends and Family: More than three times a week    Attends Religious Services: More than 4 times per year    Active Member of [employer] or Organizations: No    Attends Music therapist: Never    Marital Status: Married    Tobacco Counseling Counseling given: Not Answered   Clinical Intake:  Pre-visit preparation completed: Yes  Pain : No/denies pain     Nutritional Risks: None Diabetes: No  How often do you need to have someone help you when you read instructions, pamphlets, or other written materials from your doctor or pharmacy?: 1 - Never  Diabetic?no   Interpreter Needed?: No  Information entered by :: Jadene Pierini, LPN   Activities of Daily Living    04/14/2022    2:19 PM 04/13/2022    2:46 PM  In your present state of health, do you have any  difficulty performing the following activities:  Hearing? 0 0  Vision? 0 0  Difficulty concentrating or making decisions? 0 0  Walking or climbing stairs? 0 0  Dressing or bathing? 0 0  Doing errands, shopping? 0 0  Preparing Food and eating ? N N  Using the Toilet? N N  In the past six months, have you accidently leaked urine? N N  Do you have problems with loss of bowel control? N N  Managing your Medications? N N  Managing your Finances? N N  Housekeeping or managing your Housekeeping? N N    Patient Care Team: Libby Maw, MD as PCP - General (  Family Medicine)  Indicate any recent Medical Services you may have received from other than [facility] providers in the past year (date may be approximate).     Assessment:   This is a routine wellness examination for Tonya Bowers.  Hearing/Vision screen Vision Screening - Comments:: Annual eye exam wear glasses   Dietary issues and exercise activities discussed: Current Exercise Habits: Home exercise routine, Type of exercise: walking, Time (Minutes): 30, Frequency (Times/Week): 3, Weekly Exercise (Minutes/Week): 90, Intensity: Mild, Exercise limited by: None identified   Goals Addressed             This Visit's Progress    Patient Stated   On track    Eating healthier, drinking more water  & being more active       Depression Screen    04/14/2022    2:17 PM 07/03/2021    9:52 AM 04/08/2021   11:51 AM 09/17/2020   10:38 AM 03/06/2020    3:21 PM 11/02/2019    9:30 AM 03/25/2015    1:10 PM  PHQ 2/9 Scores  PHQ - 2 Score 0 0 0 0 0 0 0    Fall Risk    04/14/2022    2:15 PM 04/13/2022    2:46 PM 07/03/2021    9:52 AM 04/08/2021   11:43 AM 09/17/2020   10:38 AM  [place] in the past year? 0 0 0 0 0  Number falls in past yr: 0 0 0 0   Injury with Fall? 0 0  0   Risk for fall due to : No Fall Risks      Follow up Falls prevention discussed   Falls evaluation completed;Falls prevention discussed     FALL RISK  PREVENTION PERTAINING TO THE HOME:  Any stairs in or around the home? Yes  If so, are there any without handrails? No  Home free of loose throw rugs in walkways, pet beds, electrical cords, etc? Yes  Adequate lighting in your home to reduce risk of falls? Yes   ASSISTIVE DEVICES UTILIZED TO PREVENT FALLS:  Life alert? No  Use of a cane, walker or w/c? No  Grab bars in the bathroom? Yes  Shower chair or bench in shower? No  Elevated toilet seat or a handicapped toilet? No        04/14/2022    2:19 PM  6CIT Screen  What Year? 0 points  What month? 0 points  What time? 0 points  Count back from 20 0 points  Months in reverse 0 points  Repeat phrase 0 points  Total Score 0 points    Immunizations Immunization History  Administered Date(s) Administered   Fluad Quad(high Dose 65+) 04/12/2019, 05/21/2020, 04/09/2021   Influenza-Unspecified 05/03/2015, 04/11/2016, 04/08/2018   PFIZER(Purple Top)SARS-COV-2 Vaccination 08/11/2019, 09/05/2019, 04/20/2020   PNEUMOCOCCAL CONJUGATE-20 07/03/2021   Tdap 06/10/2016    TDAP status: Up to date  Flu Vaccine status: Due, Education has been provided regarding the importance of this vaccine. Advised may receive this vaccine at local pharmacy or Health Dept. Aware to provide a copy of the vaccination record if obtained from local pharmacy or Health Dept. Verbalized acceptance and understanding.  Pneumococcal vaccine status: Up to date  Covid-19 vaccine status: Completed vaccines  Qualifies for Shingles Vaccine? Yes   Zostavax completed No   Shingrix Completed?: No.    Education has been provided regarding the importance of this vaccine. Patient has been advised to call insurance company to determine out of  pocket expense if they have not yet received this vaccine. Advised may also receive vaccine at local pharmacy or Health Dept. Verbalized acceptance and understanding.  Screening Tests Health Maintenance  Topic Date Due   Zoster  Vaccines- Shingrix (1 of 2) Never done   COVID-19 Vaccine (5 - Pfizer series) 01/24/2021   INFLUENZA VACCINE  02/03/2022   MAMMOGRAM  04/01/2022   TETANUS/TDAP  06/10/2026   COLONOSCOPY (Pts 45-19yr Insurance coverage will need to be confirmed)  10/31/2026   Pneumonia Vaccine 70 Years old  Completed   DEXA SCAN  Completed   Hepatitis C Screening  Completed   HPV VACCINES  Aged Out    Health Maintenance  Health Maintenance Due  Topic Date Due   Zoster Vaccines- Shingrix (1 of 2) Never done   COVID-19 Vaccine (5 - Pfizer series) 01/24/2021   INFLUENZA VACCINE  02/03/2022   MAMMOGRAM  04/01/2022    Colorectal cancer screening: Type of screening: Colonoscopy. Completed 10/30/2016. Repeat every 10 years  Mammogram status: Ordered scheduled 04/30/2022. Pt provided with contact info and advised to call to schedule appt.   Bone Density status: Completed 04/01/2020. Results reflect: Bone density results: OSTEOPENIA. Repeat every 5 years.  Lung Cancer Screening: (Low Dose CT Chest recommended if Age 70-80years, 30 pack-year currently smoking OR have quit w/in 15years.) does not qualify.   Lung Cancer Screening Referral: n/a  Additional Screening:  Hepatitis C Screening: does not qualify; Completed 03/22/2015  Vision Screening: Recommended annual ophthalmology exams for early detection of glaucoma and other disorders of the eye. Is the patient up to date with their annual eye exam?  Yes  Who is the provider or what is the name of the office in which the patient attends annual eye exams? Dr. WClydene Laming If pt is not established with a provider, would they like to be referred to a provider to establish care? No .   Dental Screening: Recommended annual dental exams for proper oral hygiene  Community Resource Referral / Chronic Care Management: CRR required this visit?  No   CCM required this visit?  No      Plan:     I have personally reviewed and noted the following in the  patient's chart:   Medical and social history Use of alcohol, tobacco or illicit drugs  Current medications and supplements including opioid prescriptions. Patient is not currently taking opioid prescriptions. Functional ability and status Nutritional status Physical activity Advanced directives List of other physicians Hospitalizations, surgeries, and ER visits in previous 12 months Vitals Screenings to include cognitive, depression, and falls Referrals and appointments  In addition, I have reviewed and discussed with patient certain preventive protocols, quality metrics, and best practice recommendations. A written personalized care plan for preventive services as well as general preventive health recommendations were provided to patient.     LDaphane Shepherd LPN   [birth year]1916  Nurse Notes: Due Flu Vaccine

## 2022-04-15 DIAGNOSIS — H0011 Chalazion right upper eyelid: Secondary | ICD-10-CM | POA: Diagnosis not present

## 2022-04-15 DIAGNOSIS — H0279 Other degenerative disorders of eyelid and periocular area: Secondary | ICD-10-CM | POA: Diagnosis not present

## 2022-04-15 DIAGNOSIS — D485 Neoplasm of uncertain behavior of skin: Secondary | ICD-10-CM | POA: Diagnosis not present

## 2022-04-30 ENCOUNTER — Encounter: Payer: Self-pay | Admitting: Family Medicine

## 2022-04-30 DIAGNOSIS — Z1231 Encounter for screening mammogram for malignant neoplasm of breast: Secondary | ICD-10-CM | POA: Diagnosis not present

## 2022-05-01 ENCOUNTER — Ambulatory Visit (INDEPENDENT_AMBULATORY_CARE_PROVIDER_SITE_OTHER): Payer: Medicare Other | Admitting: Family Medicine

## 2022-05-01 ENCOUNTER — Encounter: Payer: Self-pay | Admitting: Family Medicine

## 2022-05-01 VITALS — BP 138/82 | HR 63 | Temp 97.0°F | Ht 65.0 in | Wt 236.2 lb

## 2022-05-01 DIAGNOSIS — I10 Essential (primary) hypertension: Secondary | ICD-10-CM | POA: Diagnosis not present

## 2022-05-01 DIAGNOSIS — E7800 Pure hypercholesterolemia, unspecified: Secondary | ICD-10-CM | POA: Diagnosis not present

## 2022-05-01 LAB — COMPREHENSIVE METABOLIC PANEL
ALT: 16 U/L (ref 0–35)
AST: 17 U/L (ref 0–37)
Albumin: 4.1 g/dL (ref 3.5–5.2)
Alkaline Phosphatase: 58 U/L (ref 39–117)
BUN: 11 mg/dL (ref 6–23)
CO2: 30 mEq/L (ref 19–32)
Calcium: 9.4 mg/dL (ref 8.4–10.5)
Chloride: 104 mEq/L (ref 96–112)
Creatinine, Ser: 0.93 mg/dL (ref 0.40–1.20)
GFR: 62.36 mL/min (ref >60.00)
Glucose, Bld: 110 mg/dL — ABNORMAL HIGH (ref 70–99)
Potassium: 4.4 mEq/L (ref 3.5–5.1)
Sodium: 140 mEq/L (ref 135–145)
Total Bilirubin: 0.9 mg/dL (ref 0.2–1.2)
Total Protein: 7 g/dL (ref 6.0–8.3)

## 2022-05-01 LAB — URINALYSIS, ROUTINE W REFLEX MICROSCOPIC
Bilirubin Urine: NEGATIVE
Hgb urine dipstick: NEGATIVE
Ketones, ur: NEGATIVE
Leukocytes,Ua: NEGATIVE
Nitrite: NEGATIVE
Specific Gravity, Urine: 1.02 (ref 1.000–1.030)
Total Protein, Urine: NEGATIVE
Urine Glucose: NEGATIVE
Urobilinogen, UA: 0.2 (ref 0.0–1.0)
pH: 6 (ref 5.0–8.0)

## 2022-05-01 LAB — CBC
HCT: 38.7 % (ref 36.0–46.0)
Hemoglobin: 12.6 g/dL (ref 12.0–15.0)
MCHC: 32.7 g/dL (ref 30.0–36.0)
MCV: 83.2 fl (ref 78.0–100.0)
Platelets: 252 10*3/uL (ref 150.0–400.0)
RBC: 4.64 Mil/uL (ref 3.87–5.11)
RDW: 14 % (ref 11.5–15.5)
WBC: 7.7 10*3/uL (ref 4.0–10.5)

## 2022-05-01 LAB — LIPID PANEL
Cholesterol: 151 mg/dL (ref 0–200)
HDL: 45.5 mg/dL (ref >39.00)
LDL Cholesterol: 82 mg/dL (ref 0–99)
NonHDL: 105.04
Total CHOL/HDL Ratio: 3
Triglycerides: 115 mg/dL (ref 0.0–149.0)
VLDL: 23 mg/dL (ref 0.0–40.0)

## 2022-05-01 MED ORDER — IRBESARTAN 150 MG PO TABS: 150.0000 mg | ORAL_TABLET | Freq: Every day | ORAL | 2 refills | Status: DC

## 2022-05-01 NOTE — Progress Notes (Signed)
Established Patient Office Visit  Subjective   Patient ID: Tonya Bowers, female    DOB: Feb 09, 1951  Age: 71 y.o. MRN: [id number]  Chief Complaint  Patient presents with   Medication Reaction    Patient feels like she's having reaction to Spironolactone states breast are tender to touch, at mammogram recommended to see PCP.     HPI follow-up of hypertension and elevated cholesterol.  Has been experiencing breast tenderness with Aldactone.  She continues to take it.  Continues with pravastatin.  Lost to follow-up since December of last year.  Exercising on occasion.    Review of Systems  Constitutional: Negative.   HENT: Negative.    Eyes:  Negative for blurred vision, discharge and redness.  Respiratory: Negative.    Cardiovascular: Negative.   Gastrointestinal:  Negative for abdominal pain.  Genitourinary: Negative.   Musculoskeletal: Negative.  Negative for myalgias.  Skin:  Negative for rash.  Neurological:  Negative for tingling, loss of consciousness and weakness.  Endo/Heme/Allergies:  Negative for polydipsia.      Objective:     BP 138/82 (BP Location: Right Arm, Patient Position: Sitting, Cuff Size: Large)   Pulse 63   Temp (!) 97 F (36.1 C) (Temporal)   Ht '5\' 5"'$  (1.651 m)   Wt 236 lb 3.2 oz (107.1 kg)   LMP 07/07/1995   SpO2 97%   BMI 39.31 kg/m    Physical Exam Constitutional:      General: She is not in acute distress.    Appearance: Normal appearance. She is not ill-appearing, toxic-appearing or diaphoretic.  HENT:     Head: Normocephalic and atraumatic.     Right Ear: External ear normal.     Left Ear: External ear normal.     Mouth/Throat:     Mouth: Mucous membranes are moist.     Pharynx: Oropharynx is clear. No oropharyngeal exudate or posterior oropharyngeal erythema.  Eyes:     General: No scleral icterus.       Right eye: No discharge.        Left eye: No discharge.     Extraocular Movements: Extraocular movements intact.      Conjunctiva/sclera: Conjunctivae normal.     Pupils: Pupils are equal, round, and reactive to light.  Cardiovascular:     Rate and Rhythm: Normal rate and regular rhythm.  Pulmonary:     Effort: Pulmonary effort is normal. No respiratory distress.     Breath sounds: Normal breath sounds.  Abdominal:     General: Bowel sounds are normal.  Musculoskeletal:     Cervical back: No rigidity or tenderness.  Skin:    General: Skin is warm and dry.  Neurological:     Mental Status: She is alert and oriented to person, place, and time.  Psychiatric:        Mood and Affect: Mood normal.        Behavior: Behavior normal.      No results found for any visits on 05/01/22.    The 10-year ASCVD risk score (Arnett DK, et al., 2019) is: 13.6%    Assessment & Plan:   Problem List Items Addressed This Visit       Cardiovascular and Mediastinum   Essential hypertension - Primary   Relevant Medications   irbesartan (AVAPRO) 150 MG tablet   Other Relevant Orders   CBC   Comprehensive metabolic panel   Urinalysis, Routine w reflex microscopic     Other   Elevated LDL  cholesterol level   Relevant Orders   Comprehensive metabolic panel   Lipid panel    Return in about 6 weeks (around 06/12/2022).  Discontinue Aldactone.  Start irbesartan 150 mg daily.  Information was given on irbesartan.  Also information was given on managing hypertension.  Advised to have the COVID booster and the RSV vaccine.  Libby Maw, MD

## 2022-05-21 DIAGNOSIS — H0011 Chalazion right upper eyelid: Secondary | ICD-10-CM | POA: Diagnosis not present

## 2022-06-12 ENCOUNTER — Ambulatory Visit (INDEPENDENT_AMBULATORY_CARE_PROVIDER_SITE_OTHER): Payer: Medicare Other | Admitting: Family Medicine

## 2022-06-12 ENCOUNTER — Encounter: Payer: Self-pay | Admitting: Family Medicine

## 2022-06-12 VITALS — BP 122/76 | HR 61 | Temp 97.0°F | Ht 65.5 in | Wt 238.0 lb

## 2022-06-12 DIAGNOSIS — I10 Essential (primary) hypertension: Secondary | ICD-10-CM

## 2022-06-12 DIAGNOSIS — R7309 Other abnormal glucose: Secondary | ICD-10-CM | POA: Diagnosis not present

## 2022-06-12 DIAGNOSIS — N644 Mastodynia: Secondary | ICD-10-CM | POA: Insufficient documentation

## 2022-06-12 NOTE — Progress Notes (Signed)
Established Patient Office Visit   Subjective:  Patient ID: Tonya Bowers, female    DOB: 16-Jul-1950  Age: 71 y.o. MRN: [id number]  Chief Complaint  Patient presents with   Follow-up    Breast still tender. Wants to discuss Avapro medication.    HPI Encounter Diagnoses  Name Primary?   Essential hypertension Yes   Elevated glucose    Breast pain    Follow-up of high blood pressure treated with Avapro ongoing breast pain.  Negative mammogram at her GYN doctor's office.  Exam was negative.  Erythema or discharge.  Tenderness is in the areolar area.  It is sensitive to pressure.  Tolerating Avapro well.  Blood pressure well-controlled.  Recent blood work showed glucose.   Review of Systems  Constitutional: Negative.   HENT: Negative.    Eyes:  Negative for blurred vision, discharge and redness.  Respiratory: Negative.    Cardiovascular: Negative.   Gastrointestinal:  Negative for abdominal pain.  Genitourinary: Negative.   Musculoskeletal: Negative.  Negative for myalgias.  Skin:  Negative for rash.  Neurological:  Negative for tingling, loss of consciousness and weakness.  Endo/Heme/Allergies:  Negative for polydipsia.     Current Outpatient Medications:    calcium carbonate (OSCAL) 1500 (600 Ca) MG TABS tablet, Take 600 mg of elemental calcium by mouth 2 (two) times daily with a meal., Disp: , Rfl:    cholecalciferol (VITAMIN D3) 25 MCG (1000 UNIT) tablet, Take 1,000 Units by mouth daily., Disp: , Rfl:    estradiol (ESTRACE) 0.5 MG tablet, Take 0.5 mg by mouth daily., Disp: , Rfl: 2   irbesartan (AVAPRO) 150 MG tablet, Take 1 tablet (150 mg total) by mouth daily., Disp: 30 tablet, Rfl: 2   Multiple Vitamins-Minerals (MULTIVITAMIN WITH MINERALS) tablet, Take 1 tablet by mouth daily., Disp: , Rfl:    pravastatin (PRAVACHOL) 20 MG tablet, TAKE 1 TABLET BY MOUTH AT  BEDTIME, Disp: 90 tablet, Rfl: 3   Objective:     BP 122/76 (BP Location: Right Arm, Patient Position:  Sitting)   Pulse 61   Temp (!) 97 F (36.1 C) (Temporal)   Ht 5' 5.5" (1.664 m)   Wt 238 lb (108 kg)   LMP 07/07/1995   SpO2 99%   BMI 39.00 kg/m    Physical Exam Constitutional:      General: She is not in acute distress.    Appearance: Normal appearance. She is not ill-appearing, toxic-appearing or diaphoretic.  HENT:     Head: Normocephalic and atraumatic.     Right Ear: External ear normal.     Left Ear: External ear normal.  Eyes:     General: No scleral icterus.       Right eye: No discharge.        Left eye: No discharge.     Extraocular Movements: Extraocular movements intact.     Conjunctiva/sclera: Conjunctivae normal.  Pulmonary:     Effort: Pulmonary effort is normal. No respiratory distress.  Skin:    General: Skin is warm and dry.  Neurological:     Mental Status: She is alert and oriented to person, place, and time.  Psychiatric:        Mood and Affect: Mood normal.        Behavior: Behavior normal.      No results found for any visits on 06/12/22.    The 10-year ASCVD risk score (Arnett DK, et al., 2019) is: 8.4%    Assessment & Plan:  Essential hypertension -     Basic metabolic panel  Elevated glucose -     Basic metabolic panel -     Hemoglobin A1c  Breast pain    Return in about 3 months (around 09/11/2022).  Will check with her GYN doctor Dr. Lynnette Caffey, not obtaining a breast ultrasound.  I will order it and that is difficult to obtain.  Not believe this is secondary to medication.  Blood pressure well-controlled with Avapro.  Follow-up on health.  Glucose.  Hemoglobin A1c stable at 6.0 2 years ago.  Libby Maw, MD

## 2022-06-12 NOTE — Addendum Note (Signed)
Addended by: Alphonzo Lemmings on: 06/12/2022 12:40 PM   Modules accepted: Orders

## 2022-06-14 ENCOUNTER — Other Ambulatory Visit: Payer: Self-pay | Admitting: Family Medicine

## 2022-06-14 DIAGNOSIS — I10 Essential (primary) hypertension: Secondary | ICD-10-CM

## 2022-06-15 ENCOUNTER — Other Ambulatory Visit: Payer: Self-pay

## 2022-06-15 ENCOUNTER — Encounter: Payer: Self-pay | Admitting: Family Medicine

## 2022-06-15 DIAGNOSIS — N644 Mastodynia: Secondary | ICD-10-CM

## 2022-06-16 ENCOUNTER — Other Ambulatory Visit (INDEPENDENT_AMBULATORY_CARE_PROVIDER_SITE_OTHER): Payer: Medicare Other

## 2022-06-16 DIAGNOSIS — R7309 Other abnormal glucose: Secondary | ICD-10-CM

## 2022-06-16 DIAGNOSIS — I10 Essential (primary) hypertension: Secondary | ICD-10-CM | POA: Diagnosis not present

## 2022-06-16 LAB — HEMOGLOBIN A1C: Hgb A1c MFr Bld: 6.3 % (ref 4.6–6.5)

## 2022-06-16 LAB — BASIC METABOLIC PANEL
BUN: 19 mg/dL (ref 6–23)
CO2: 30 mEq/L (ref 19–32)
Calcium: 9.1 mg/dL (ref 8.4–10.5)
Chloride: 103 mEq/L (ref 96–112)
Creatinine, Ser: 1 mg/dL (ref 0.40–1.20)
GFR: 57.1 mL/min — ABNORMAL LOW (ref >60.00)
Glucose, Bld: 104 mg/dL — ABNORMAL HIGH (ref 70–99)
Potassium: 3.9 mEq/L (ref 3.5–5.1)
Sodium: 139 mEq/L (ref 135–145)

## 2022-06-18 DIAGNOSIS — H0011 Chalazion right upper eyelid: Secondary | ICD-10-CM | POA: Diagnosis not present

## 2022-06-18 DIAGNOSIS — H0279 Other degenerative disorders of eyelid and periocular area: Secondary | ICD-10-CM | POA: Diagnosis not present

## 2022-07-02 ENCOUNTER — Telehealth: Payer: Self-pay

## 2022-07-02 ENCOUNTER — Other Ambulatory Visit: Payer: Self-pay | Admitting: Family Medicine

## 2022-07-02 DIAGNOSIS — I10 Essential (primary) hypertension: Secondary | ICD-10-CM

## 2022-07-02 NOTE — Patient Outreach (Signed)
  Care Coordination   Initial Visit Note   07/02/2022 Name: Tonya Bowers MRN: [id number] DOB: 07-23-1950  Tonya Bowers is a 71 y.o. year old female who sees Libby Maw, MD for primary care. I spoke with  Tonya Bowers by phone today.  What matters to the patients health and wellness today?  none    Goals Addressed             This Visit's Progress    COMPLETED: Care Coordination Activities-No follow up required       Care Coordination Interventions: Advised patient to Annual exam Discussed [facility] services and support. Assessed SDOH. Medications reviewed Advised to discuss with primary care physician if services needed in the future.           SDOH assessments and interventions completed:  Yes  SDOH Interventions Today    Flowsheet Row Most Recent Value  SDOH Interventions   Housing Interventions Intervention Not Indicated  Transportation Interventions Intervention Not Indicated        Care Coordination Interventions:  Yes, provided   Follow up plan: No further intervention required.   Encounter Outcome:  Pt. Visit Completed   Jone Baseman, RN, MSN [facility] Care Management Coordinator Direct Line [phone]

## 2022-07-02 NOTE — Patient Instructions (Signed)
Visit Information  Thank you for taking time to visit with me today. Please don't hesitate to contact me if I can be of assistance to you.   Following are the goals we discussed today:   Goals Addressed             This Visit's Progress    COMPLETED: Care Coordination Activities-No follow up required       Care Coordination Interventions: Advised patient to Annual exam Discussed [facility] services and support. Assessed SDOH. Medications reviewed Advised to discuss with primary care physician if services needed in the future.           If you are experiencing a Mental Health or [place] or need someone to talk to, please call the Suicide and Crisis Lifeline: 988   Patient verbalizes understanding of instructions and care plan provided today and agrees to view in [place]. Active MyChart status and patient understanding of how to access instructions and care plan via MyChart confirmed with patient.     No further follow up required: decline  Jone Baseman, RN, MSN [facility] Care Management Coordinator Direct Line [phone]

## 2022-07-22 ENCOUNTER — Ambulatory Visit: Payer: Medicare Other | Admitting: Family Medicine

## 2022-07-22 VITALS — BP 134/88 | Ht 65.5 in | Wt 237.0 lb

## 2022-07-22 DIAGNOSIS — M25532 Pain in left wrist: Secondary | ICD-10-CM

## 2022-07-22 NOTE — Patient Instructions (Signed)
Wear wrist brace regularly for at least the next 2 weeks. Icing 15 minutes at a time 3-4 times a day. Ibuprofen '600mg'$  three times a day with food for pain and inflammation for a few days at least then as needed. Ok to take tylenol with this. Follow up with me in 2 weeks if you're not improving.

## 2022-07-23 ENCOUNTER — Encounter: Payer: Self-pay | Admitting: Family Medicine

## 2022-07-23 NOTE — Progress Notes (Signed)
PCP: Libby Maw, MD  Subjective:   HPI: Patient is a 72 y.o. female here for left wrist pain.  Patient reports a few days ago she noticed ulnar sided left wrist pain. Felt bad enough that she could barely move her wrist. Associated swelling but no bruising. No injury or trauma. Bought a wrist brace and taking ibuprofen twice a day, noticed improvement.  Past Medical History:  Diagnosis Date   Arthritis    Cataract    Diverticulosis    GERD (gastroesophageal reflux disease)    Hyperplastic colon polyp 09/09/06    Current Outpatient Medications on File Prior to Visit  Medication Sig Dispense Refill   calcium carbonate (OSCAL) 1500 (600 Ca) MG TABS tablet Take 600 mg of elemental calcium by mouth 2 (two) times daily with a meal.     cholecalciferol (VITAMIN D3) 25 MCG (1000 UNIT) tablet Take 1,000 Units by mouth daily.     estradiol (ESTRACE) 0.5 MG tablet Take 0.5 mg by mouth daily.  2   irbesartan (AVAPRO) 150 MG tablet TAKE 1 TABLET BY MOUTH DAILY 90 tablet 1   Multiple Vitamins-Minerals (MULTIVITAMIN WITH MINERALS) tablet Take 1 tablet by mouth daily.     pravastatin (PRAVACHOL) 20 MG tablet TAKE 1 TABLET BY MOUTH AT  BEDTIME 90 tablet 3   No current facility-administered medications on file prior to visit.    Past Surgical History:  Procedure Laterality Date   ABDOMINAL HYSTERECTOMY     CHOLECYSTECTOMY     COLONOSCOPY     POLYPECTOMY     TUBAL LIGATION      Allergies  Allergen Reactions   Aldactone [Spironolactone] Other (See Comments)    Breast tenderness    Amlodipine Other (See Comments)    Hot flash    Lisinopril Cough   Penicillins    Thiazide-Type Diuretics Other (See Comments)    Hypokalemia.    BP 134/88   Ht 5' 5.5" (1.664 m)   Wt 237 lb (107.5 kg)   LMP 07/07/1995   BMI 38.84 kg/m       No data to display              No data to display              Objective:  Physical Exam:  Gen: NAD, comfortable in exam  room  Left wrist: No deformity, swelling, bruising.Marland Kitchen FROM with 5/5 strength.  Pain with ulnar deviation.  No pain with finger motions. Tenderness to palpation over TFCC, dorsal wrist joint. NVI distally.   Assessment & Plan:  1. Left wrist pain - 2/2 flare of arthritis.  Limited MSK u/s at bedside with small degenerative tear of TFCC also.  Wrist brace, icing, ibuprofen.  F/u in 2 weeks but she's improving - can f/u prn.

## 2022-08-03 DIAGNOSIS — N644 Mastodynia: Secondary | ICD-10-CM | POA: Diagnosis not present

## 2022-08-05 ENCOUNTER — Encounter: Payer: Self-pay | Admitting: Family Medicine

## 2022-08-05 ENCOUNTER — Ambulatory Visit: Payer: Medicare Other | Admitting: Family Medicine

## 2022-08-05 VITALS — BP 124/86 | Ht 65.5 in | Wt 236.0 lb

## 2022-08-05 DIAGNOSIS — S6982XD Other specified injuries of left wrist, hand and finger(s), subsequent encounter: Secondary | ICD-10-CM | POA: Diagnosis not present

## 2022-08-05 DIAGNOSIS — S6982XA Other specified injuries of left wrist, hand and finger(s), initial encounter: Secondary | ICD-10-CM | POA: Insufficient documentation

## 2022-08-05 NOTE — Progress Notes (Addendum)
  SUBJECTIVE:   CHIEF COMPLAINT / HPI:   Patient presenting for follow up of ulnar wrist pain.  Patient has seen significant improvement of her wrist pain after using the wrist brace and ibuprofen for pain relief.  She is seen 80% improvement.  She use the wrist brace for about a week after seeing [place] at [facility] but then discontinued this.  She has no difficulty with ADLs or significant pain with any specific maneuvers of the wrist.  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 Great improvement with conservative treatment. Continue with ibuprofen for next few days (if experiences abdominal pain with this, can stop) and and has a wrist brace if needed in the future.  Patient can return as needed.     No follow-ups on file. Erskine Emery, MD 08/05/2022, 10:55 AM PGY-2, [place]

## 2022-08-05 NOTE — Assessment & Plan Note (Signed)
Arthritis and degenerative tear of the TFCC contributing to onset of pain. Great improvement with conservative treatment. Continue with ibuprofen for next few days (if experiences abdominal pain with this, can stop) and and has a wrist brace if needed in the future.  Patient can return as needed.

## 2022-08-12 ENCOUNTER — Other Ambulatory Visit: Payer: Self-pay | Admitting: Obstetrics & Gynecology

## 2022-08-12 DIAGNOSIS — N644 Mastodynia: Secondary | ICD-10-CM

## 2022-08-24 ENCOUNTER — Ambulatory Visit
Admission: RE | Admit: 2022-08-24 | Discharge: 2022-08-24 | Disposition: A | Discharge status: Home / Self Care | Payer: Medicare Other | Source: Ambulatory Visit | Attending: Obstetrics & Gynecology | Admitting: Obstetrics & Gynecology

## 2022-08-24 ENCOUNTER — Other Ambulatory Visit: Payer: Self-pay | Admitting: Family Medicine

## 2022-08-24 DIAGNOSIS — N644 Mastodynia: Secondary | ICD-10-CM | POA: Diagnosis not present

## 2022-08-24 DIAGNOSIS — E7800 Pure hypercholesterolemia, unspecified: Secondary | ICD-10-CM

## 2022-09-19 ENCOUNTER — Other Ambulatory Visit: Payer: Self-pay | Admitting: Family Medicine

## 2022-09-19 DIAGNOSIS — I10 Essential (primary) hypertension: Secondary | ICD-10-CM

## 2022-09-23 ENCOUNTER — Telehealth: Payer: Self-pay | Admitting: Family Medicine

## 2022-09-23 NOTE — Telephone Encounter (Signed)
Caller Name: Jeneen Rinks from [facility] Call back phone #: [phone]  Reason for Call: Jeneen Rinks is reporting that a study was done ABI to determine Periferal artery disease.right leg is .55 left leg .69 both moderate.

## 2022-09-23 NOTE — Telephone Encounter (Signed)
FYI: documentation for chart

## 2022-09-28 ENCOUNTER — Ambulatory Visit (INDEPENDENT_AMBULATORY_CARE_PROVIDER_SITE_OTHER): Payer: Medicare Other | Admitting: Family Medicine

## 2022-09-28 ENCOUNTER — Encounter: Payer: Self-pay | Admitting: Family Medicine

## 2022-09-28 VITALS — BP 136/80 | HR 80 | Temp 97.9°F | Resp 18 | Ht 65.5 in | Wt 237.4 lb

## 2022-09-28 DIAGNOSIS — R0989 Other specified symptoms and signs involving the circulatory and respiratory systems: Secondary | ICD-10-CM

## 2022-09-28 NOTE — Telephone Encounter (Signed)
Appointment scheduled.

## 2022-09-28 NOTE — Progress Notes (Signed)
Established Patient Office Visit   Subjective:  Patient ID: Tonya Bowers, female    DOB: Jun 23, 1951  Age: 72 y.o. MRN: [id number]  Chief Complaint  Patient presents with   Follow-up    Follow up on ABI results  Pt states she is doing well     HPI Encounter Diagnoses  Name Primary?   Diminished pulses in lower extremity Yes   For follow-up of visiting home health nurse exam.  Doppler flow studies of her lower extremities had shown decreased blood flow.  She has no claudication pains she has no lesions or ulcerations.  Elevated cholesterol is well-controlled.  Blood pressure is well-controlled.  She does not have diabetes.   Review of Systems  Constitutional: Negative.   HENT: Negative.    Eyes:  Negative for blurred vision, discharge and redness.  Respiratory: Negative.    Cardiovascular: Negative.   Gastrointestinal:  Negative for abdominal pain.  Genitourinary: Negative.   Musculoskeletal: Negative.  Negative for myalgias.  Skin:  Negative for rash.  Neurological:  Negative for tingling, loss of consciousness and weakness.  Endo/Heme/Allergies:  Negative for polydipsia.     Current Outpatient Medications:    calcium carbonate (OSCAL) 1500 (600 Ca) MG TABS tablet, Take 600 mg of elemental calcium by mouth 2 (two) times daily with a meal., Disp: , Rfl:    cholecalciferol (VITAMIN D3) 25 MCG (1000 UNIT) tablet, Take 1,000 Units by mouth daily., Disp: , Rfl:    estradiol (ESTRACE) 0.5 MG tablet, Take 0.5 mg by mouth daily., Disp: , Rfl: 2   irbesartan (AVAPRO) 150 MG tablet, TAKE 1 TABLET BY MOUTH DAILY, Disp: 100 tablet, Rfl: 2   Multiple Vitamins-Minerals (MULTIVITAMIN WITH MINERALS) tablet, Take 1 tablet by mouth daily., Disp: , Rfl:    pravastatin (PRAVACHOL) 20 MG tablet, TAKE 1 TABLET BY MOUTH AT  BEDTIME, Disp: 100 tablet, Rfl: 2   Objective:     BP 136/80   Pulse 80   Temp 97.9 F (36.6 C) (Temporal)   Resp 18   Ht 5' 5.5" (1.664 m)   Wt 237 lb 6 oz (107.7  kg)   LMP 07/07/1995   SpO2 98%   BMI 38.90 kg/m    Physical Exam Constitutional:      General: She is not in acute distress.    Appearance: Normal appearance. She is not ill-appearing, toxic-appearing or diaphoretic.  HENT:     Head: Normocephalic and atraumatic.     Right Ear: External ear normal.     Left Ear: External ear normal.  Eyes:     General: No scleral icterus.       Right eye: No discharge.        Left eye: No discharge.     Extraocular Movements: Extraocular movements intact.     Conjunctiva/sclera: Conjunctivae normal.  Cardiovascular:     Pulses:          Dorsalis pedis pulses are 2+ on the right side and 0 on the left side.       Posterior tibial pulses are 1+ on the right side and 0 on the left side.  Pulmonary:     Effort: Pulmonary effort is normal. No respiratory distress.  Skin:    General: Skin is warm and dry.  Neurological:     Mental Status: She is alert and oriented to person, place, and time.  Psychiatric:        Mood and Affect: Mood normal.  Behavior: Behavior normal.    Diabetic Foot Exam - Simple   Simple Foot Form Diabetic Foot exam was performed with the following findings: Yes 09/28/2022  4:30 PM  Visual Inspection No deformities, no ulcerations, no other skin breakdown bilaterally: Yes Sensation Testing Pulse Check See comments: Yes Comments Capillary refill is excellent in both great toes.      No results found for any visits on 09/28/22.    The 10-year ASCVD risk score (Arnett DK, et al., 2019) is: 10.3%    Assessment & Plan:   Diminished pulses in lower extremity -     VAS [place] ABI WITH/WO TBI; Future    No follow-ups on file.  Repeat Doppler ultrasounds.  Anticipate normal results.  Libby Maw, MD

## 2023-02-13 ENCOUNTER — Ambulatory Visit
Admission: EM | Admit: 2023-02-13 | Discharge: 2023-02-13 | Disposition: A | Discharge status: Home / Self Care | Payer: Medicare Other | Attending: Internal Medicine | Admitting: Internal Medicine

## 2023-02-13 DIAGNOSIS — U071 COVID-19: Secondary | ICD-10-CM

## 2023-02-13 DIAGNOSIS — R051 Acute cough: Secondary | ICD-10-CM

## 2023-02-13 MED ORDER — BENZONATATE 100 MG PO CAPS: 100.0000 mg | ORAL_CAPSULE | Freq: Three times a day (TID) | ORAL | 0 refills | Status: DC

## 2023-02-13 MED ORDER — PAXLOVID (150/100) 10 X 150 MG & 10 X 100MG PO TBPK: 2.0000 | ORAL_TABLET | Freq: Two times a day (BID) | ORAL | 0 refills | Status: AC

## 2023-02-13 NOTE — ED Triage Notes (Signed)
Pt presents to UC w/ c/o coughing, nasal congestion, sinus pressure x4 days. Home Covid test positive Pt has taken cold/cough medicines, sudafed, dayquil and nyquil helped some.

## 2023-02-13 NOTE — Discharge Instructions (Signed)
The clinic will contact you with results of your COVID test if positive.  Start Paxlovid twice daily for 5 days.  You may also take Tessalon 3 times a day as needed for your cough.  Lots of rest and fluids.  Please follow-up with your PCP if your symptoms or not improving.  Please go to the emergency room if you develop any worsening symptoms.  I hope you feel better soon!

## 2023-02-13 NOTE — ED Provider Notes (Addendum)
[facility]    CSN: [id number] Arrival date & time: 02/13/23  1035      History   Chief Complaint No chief complaint on file.   HPI Tonya Bowers is a 72 y.o. female  presents for evaluation of URI symptoms for 4 days. Patient reports associated symptoms of cough, congestion, sinus pressure, sore throat/postnasal drip. Denies N/V/D, fevers, ear pain, body aches, shortness of breath. Patient does not eat have a hx of asthma or smoking. No known sick contacts.  Patient reports had a positive home COVID test last night.  She has never had COVID in the past.  She states she is vaccinated for COVID and has all boosters except for the most recent one.  Pt has taken Sudafed, DayQuil and NyQuil OTC for symptoms. Pt has no other concerns at this time.   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/05/2022   Breast pain 06/12/2022   Elevated LDL cholesterol level 07/03/2021   Need for pneumococcal vaccination 07/03/2021   Lumbar radiculopathy 07/03/2021   Seasonal allergic rhinitis due to pollen 09/17/2020   Hepatic steatosis 12/22/2019   Elevated glucose 12/22/2019   Cyst of pancreas 12/22/2019   Mid back pain on left side 12/15/2018   Medication refill 12/15/2018   Hypokalemia 12/15/2018   Healthcare maintenance 06/13/2018   Essential hypertension 09/03/2015    Past Surgical History:  Procedure Laterality Date   ABDOMINAL HYSTERECTOMY     CHOLECYSTECTOMY     COLONOSCOPY     POLYPECTOMY     TUBAL LIGATION      OB History   No obstetric history on file.      Home Medications    Prior to Admission medications   Medication Sig Start Date End Date Taking? Authorizing Provider   benzonatate (TESSALON) 100 MG capsule Take 1 capsule (100 mg total) by mouth every 8 (eight) hours. 02/13/23  Yes Radford Pax, NP  nirmatrelvir & ritonavir (PAXLOVID, 150/100,) 10 x 150 MG & 10 x 100MG  TBPK Take 2 tablets by mouth 2 (two) times daily for 5 days. 02/13/23 02/18/23 Yes Radford Pax, NP  calcium carbonate (OSCAL) 1500 (600 Ca) MG TABS tablet Take 600 mg of elemental calcium by mouth 2 (two) times daily with a meal.    [provider]  cholecalciferol (VITAMIN D3) 25 MCG (1000 UNIT) tablet Take 1,000 Units by mouth daily.    [provider]  estradiol (ESTRACE) 0.5 MG tablet Take 0.5 mg by mouth daily. 03/24/18   [provider]  irbesartan (AVAPRO) 150 MG tablet TAKE 1 TABLET BY MOUTH DAILY 09/21/22   Mliss Sax, MD  Multiple Vitamins-Minerals (MULTIVITAMIN WITH MINERALS) tablet Take 1 tablet by mouth daily.    [provider]  pravastatin (PRAVACHOL) 20 MG tablet TAKE 1 TABLET BY MOUTH AT  BEDTIME 08/24/22   Mliss Sax, MD    Family History Family History  Problem Relation Age of Onset   Hypertension Mother    Hypertension Father    Kidney disease Brother        kidney transplant    Hypertension Brother    Colon cancer Neg Hx  Social History Social History   Tobacco Use   Smoking status: Never   Smokeless tobacco: Never  Vaping Use   Vaping status: Never Used  Substance Use Topics   Alcohol use: Yes    Comment: maybe occasionally; twice a year   Drug use: No     Allergies   Aldactone [spironolactone], Amlodipine, Lisinopril, Penicillins, and Thiazide-type diuretics   Review of Systems Review of Systems  HENT:  Positive for congestion, postnasal drip, sinus pressure and sore throat.   Respiratory:  Positive for cough.      Physical Exam Triage Vital Signs ED Triage Vitals  Encounter Vitals Group     BP 02/13/23 1050 (!) 159/84     Systolic BP Percentile --      Diastolic BP Percentile --       Pulse Rate 02/13/23 1050 67     Resp 02/13/23 1050 16     Temp 02/13/23 1050 98.7 F (37.1 C)     Temp Source 02/13/23 1050 Oral     SpO2 02/13/23 1050 99 %     Weight --      Height --      Head Circumference --      Peak Flow --      Pain Score 02/13/23 1055 1     Pain Loc --      Pain Education --      Exclude from Growth Chart --    No data found.  Updated Vital Signs BP (!) 159/84 (BP Location: Left Arm)   Pulse 67   Temp 98.7 F (37.1 C) (Oral)   Resp 16   LMP 07/07/1995   SpO2 99%   Visual Acuity Right Eye Distance:   Left Eye Distance:   Bilateral Distance:    Right Eye Near:   Left Eye Near:    Bilateral Near:     Physical Exam Vitals and nursing note reviewed.  Constitutional:      General: She is not in acute distress.    Appearance: She is well-developed. She is not ill-appearing.  HENT:     Head: Normocephalic and atraumatic.     Right Ear: Tympanic membrane and ear canal normal.     Left Ear: Tympanic membrane and ear canal normal.     Nose: Congestion present.     Mouth/Throat:     Mouth: Mucous membranes are moist.     Pharynx: Oropharynx is clear. Uvula midline. Posterior oropharyngeal erythema present.     Tonsils: No tonsillar exudate or tonsillar abscesses.  Eyes:     Conjunctiva/sclera: Conjunctivae normal.     Pupils: Pupils are equal, round, and reactive to light.  Cardiovascular:     Rate and Rhythm: Normal rate and regular rhythm.     Heart sounds: Normal heart sounds.  Pulmonary:     Effort: Pulmonary effort is normal.     Breath sounds: Normal breath sounds.  Musculoskeletal:     Cervical back: Normal range of motion and neck supple.  Lymphadenopathy:     Cervical: No cervical adenopathy.  Skin:    General: Skin is warm and dry.  Neurological:     General: No focal deficit present.     Mental Status: She is alert and oriented to person, place, and time.  Psychiatric:        Mood and Affect: Mood normal.         Behavior: Behavior normal.      UC Treatments / Results  Labs (all labs ordered are listed, but only abnormal results are displayed) Labs Reviewed  SARS CORONAVIRUS 2 (TAT 6-24 HRS)   Basic metabolic panel Order: [id number] Status: Final result     Visible to patient: Yes (seen)     Next appt: 04/19/2023 at 08:15 AM in Family [facility]-ANNUAL WELLNESS VISIT)     Dx: Elevated glucose; Essential hypertension   1 Result Note     1 Patient Communication          Component Ref Range & Units 8 mo ago (06/16/22) 9 mo ago (05/01/22) 1 yr ago (07/03/21) 1 yr ago (04/01/21) 2 yr ago (09/17/20) 3 yr ago (02/05/20) 3 yr ago (11/02/19)  Sodium 135 - 145 mEq/L 139 140 138 138 140 140 140  Potassium 3.5 - 5.1 mEq/L 3.9 4.4 3.9 3.2 Low  3.4 Low  4.9 3.8  Chloride 96 - 112 mEq/L 103 104 103 100 98 108 100  CO2 19 - 32 mEq/L 30 30 30 29  33 High  27 34 High   Glucose, Bld 70 - 99 mg/dL 093 High  235 High  97 112 High  96 93 113 High   BUN 6 - 23 mg/dL 19 11 11 13 15 11 13   Creatinine, Ser 0.40 - 1.20 mg/dL 5.73 2.20 2.54 2.70 6.23 0.75 0.88  GFR >60.00 mL/min 57.10 Low  62.36 CM 67.02 CM 68.07 CM 67.39 CM 92.96 77.36  Comment: Calculated using the CKD-EPI Creatinine Equation (2021)  Calcium 8.4 - 10.5 mg/dL 9.1 9.4 9.0 9.3 9.4 9.3 9.1  Resulting Agency [place] [facility] [place] [facility] [place] [facility] [place] [facility] [place] [facility] [place] [facility] [place] [facility]         Specimen Collected: 06/16/22 08:40 Last Resulted: 06/16/22 12:55      EKG   Radiology No results found.  Procedures Procedures (including critical care time)  Medications Ordered in UC Medications - No data to display  Initial Impression / Assessment and Plan / UC Course  I have reviewed the triage vital signs and the nursing notes.  Pertinent labs & imaging results that were available during my care of the patient were reviewed by me and considered in my medical decision making (see chart  for details).     Patient is well-appearing and in no acute distress.  Vital signs are stable.  Patient presenting with COVID symptoms with reported positive home COVID test.  COVID PCR obtained and will send lab and contact with results. there are one or more risk factors for progression to severe Covid-19. Symptoms are consistent with mild to moderate Covid-19. Symptoms began within the last 5 days. The patient does not require hospitalization.  Reviewed labs from December 2023, GFR 57.10.  LFTs within normal limits. There is no known allergy or hypersensitivity. The patient's medications were reviewed with the patient.  No medication adjustments indicated while on Paxlovid. The patient is not taking any HIV 1 medications. The patient desires antiviral therapy. Paxlovid sent to pharmacy, will do renal dose given GFR less than 60.  Discussed side effect profile of medication with the patient and they wish to proceed. Son as needed for cough.  Discussed lots of rest and fluids.  Patient to follow-up with PCP if symptoms or not improving.  Strict ER precautions reviewed and patient verbalized understanding.    Final Clinical Impressions(s) / UC Diagnoses   Final diagnoses:  Acute cough  Clinical diagnosis of COVID-19     Discharge Instructions  The clinic will contact you with results of your COVID test if positive.  Start Paxlovid twice daily for 5 days.  You may also take Tessalon 3 times a day as needed for your cough.  Lots of rest and fluids.  Please follow-up with your PCP if your symptoms or not improving.  Please go to the emergency room if you develop any worsening symptoms.  I hope you feel better soon!    ED Prescriptions     Medication Sig Dispense Auth. Provider   nirmatrelvir & ritonavir (PAXLOVID, 150/100,) 10 x 150 MG & 10 x 100MG  TBPK Take 2 tablets by mouth 2 (two) times daily for 5 days. 20 tablet Radford Pax, NP   benzonatate (TESSALON) 100 MG capsule Take 1  capsule (100 mg total) by mouth every 8 (eight) hours. 21 capsule Radford Pax, NP      PDMP not reviewed this encounter.   Radford Pax, NP 02/13/23 1131    Radford Pax, NP 02/13/23 [phone]

## 2023-02-14 LAB — SARS CORONAVIRUS 2 (TAT 6-24 HRS): SARS Coronavirus 2: POSITIVE — AB

## 2023-03-04 ENCOUNTER — Encounter: Payer: Self-pay | Admitting: Family Medicine

## 2023-03-04 ENCOUNTER — Ambulatory Visit: Payer: Medicare Other | Admitting: Family Medicine

## 2023-03-04 VITALS — BP 136/72 | HR 67 | Temp 98.0°F | Ht 65.0 in | Wt 234.0 lb

## 2023-03-04 DIAGNOSIS — D649 Anemia, unspecified: Secondary | ICD-10-CM | POA: Diagnosis not present

## 2023-03-04 DIAGNOSIS — K862 Cyst of pancreas: Secondary | ICD-10-CM | POA: Diagnosis not present

## 2023-03-04 DIAGNOSIS — K5900 Constipation, unspecified: Secondary | ICD-10-CM

## 2023-03-04 DIAGNOSIS — R0989 Other specified symptoms and signs involving the circulatory and respiratory systems: Secondary | ICD-10-CM | POA: Diagnosis not present

## 2023-03-04 DIAGNOSIS — R1012 Left upper quadrant pain: Secondary | ICD-10-CM | POA: Diagnosis not present

## 2023-03-04 DIAGNOSIS — R1032 Left lower quadrant pain: Secondary | ICD-10-CM | POA: Diagnosis not present

## 2023-03-04 MED ORDER — POLYETHYLENE GLYCOL 3350 17 GM/SCOOP PO POWD: ORAL | 0 refills | Status: DC

## 2023-03-04 NOTE — Progress Notes (Addendum)
Established Patient Office Visit   Subjective:  Patient ID: Tonya Bowers, female    DOB: May 11, 1951  Age: 72 y.o. MRN: [id number]  Chief Complaint  Patient presents with   Abdominal Pain    LLQ and LQ abdominal pain. Pt explains pain as a gas pain. Pt has not has much of a appetite.       No claudication pain.Abdominal Pain Associated symptoms include constipation. Pertinent negatives include no diarrhea, melena, myalgias, nausea or vomiting.   Encounter Diagnoses  Name Primary?   Cyst of pancreas Yes   Left upper quadrant abdominal pain    Left lower quadrant abdominal pain    Constipation, unspecified constipation type    Diminished pulses in lower extremity    Normocytic anemia    Presents with a 2 to 3-day history of left abdominal quadrant pain.  She has felt gassy.  There has been some constipation.  Appetite diminished without nausea or vomiting.  No fevers or chills.  No rectal bleeding.  No hematochezia.  She tried MiraLAX once and there was a watery stool.  History of pancreatic cyst.  Was never called about the ABIs for diminished pulses.   Review of Systems  Constitutional: Negative.   HENT: Negative.    Eyes:  Negative for blurred vision, discharge and redness.  Respiratory: Negative.    Cardiovascular: Negative.   Gastrointestinal:  Positive for abdominal pain and constipation. Negative for blood in stool, diarrhea, melena, nausea and vomiting.  Genitourinary: Negative.   Musculoskeletal: Negative.  Negative for myalgias.  Skin:  Negative for rash.  Neurological:  Negative for tingling, loss of consciousness and weakness.  Endo/Heme/Allergies:  Negative for polydipsia.     Current Outpatient Medications:    calcium carbonate (OSCAL) 1500 (600 Ca) MG TABS tablet, Take 600 mg of elemental calcium by mouth 2 (two) times daily with a meal., Disp: , Rfl:    cholecalciferol (VITAMIN D3) 25 MCG (1000 UNIT) tablet, Take 1,000 Units by mouth daily., Disp: , Rfl:     estradiol (ESTRACE) 0.5 MG tablet, Take 0.5 mg by mouth daily., Disp: , Rfl: 2   irbesartan (AVAPRO) 150 MG tablet, TAKE 1 TABLET BY MOUTH DAILY, Disp: 100 tablet, Rfl: 2   Multiple Vitamins-Minerals (MULTIVITAMIN WITH MINERALS) tablet, Take 1 tablet by mouth daily., Disp: , Rfl:    pravastatin (PRAVACHOL) 20 MG tablet, TAKE 1 TABLET BY MOUTH AT  BEDTIME, Disp: 100 tablet, Rfl: 2   benzonatate (TESSALON) 100 MG capsule, Take 1 capsule (100 mg total) by mouth every 8 (eight) hours. (Patient not taking: Reported on 03/04/2023), Disp: 21 capsule, Rfl: 0   polyethylene glycol powder (MIRALAX) 17 GM/SCOOP powder, 1 scoop dissolved in fluid of choice daily until bowel movement., Disp: 255 g, Rfl: 0   Objective:     BP 136/72   Pulse 67   Temp 98 F (36.7 C)   Ht 5\' 5"  (1.651 m)   Wt 234 lb (106.1 kg)   LMP 07/07/1995   SpO2 97%   BMI 38.94 kg/m  Wt Readings from Last 3 Encounters:  03/04/23 234 lb (106.1 kg)  09/28/22 237 lb 6 oz (107.7 kg)  08/05/22 236 lb (107 kg)      Physical Exam Constitutional:      General: She is not in acute distress.    Appearance: Normal appearance. She is not ill-appearing, toxic-appearing or diaphoretic.  HENT:     Head: Normocephalic and atraumatic.     Right Ear: External  ear normal.     Left Ear: External ear normal.     Mouth/Throat:     Mouth: Mucous membranes are moist.     Pharynx: Oropharynx is clear. No oropharyngeal exudate or posterior oropharyngeal erythema.  Eyes:     General: No scleral icterus.       Right eye: No discharge.        Left eye: No discharge.     Extraocular Movements: Extraocular movements intact.     Conjunctiva/sclera: Conjunctivae normal.     Pupils: Pupils are equal, round, and reactive to light.  Cardiovascular:     Rate and Rhythm: Normal rate and regular rhythm.  Pulmonary:     Effort: Pulmonary effort is normal. No respiratory distress.     Breath sounds: Normal breath sounds.  Abdominal:     General:  Abdomen is flat. Bowel sounds are normal.     Palpations: Abdomen is soft.     Tenderness: There is abdominal tenderness in the left upper quadrant. There is left CVA tenderness. There is no right CVA tenderness or guarding.  Musculoskeletal:     Cervical back: No rigidity or tenderness.  Skin:    General: Skin is warm and dry.  Neurological:     Mental Status: She is alert and oriented to person, place, and time.  Psychiatric:        Mood and Affect: Mood normal.        Behavior: Behavior normal.      Results for orders placed or performed in visit on 03/04/23  Amylase  Result Value Ref Range   Amylase 31 27 - 131 U/L  CBC with Differential/Platelet  Result Value Ref Range   WBC 10.5 4.0 - 10.5 K/uL   RBC 4.19 3.87 - 5.11 Mil/uL   Hemoglobin 11.1 (L) 12.0 - 15.0 g/dL   HCT 47.8 (L) 29.5 - 62.1 %   MCV 83.1 78.0 - 100.0 fl   MCHC 31.9 30.0 - 36.0 g/dL   RDW 30.8 65.7 - 84.6 %   Platelets 273.0 150.0 - 400.0 K/uL   Neutrophils Relative % 61.6 43.0 - 77.0 %   Lymphocytes Relative 27.0 12.0 - 46.0 %   Monocytes Relative 8.3 3.0 - 12.0 %   Eosinophils Relative 2.0 0.0 - 5.0 %   Basophils Relative 1.1 0.0 - 3.0 %   Neutro Abs 6.5 1.4 - 7.7 K/uL   Lymphs Abs 2.8 0.7 - 4.0 K/uL   Monocytes Absolute 0.9 0.1 - 1.0 K/uL   Eosinophils Absolute 0.2 0.0 - 0.7 K/uL   Basophils Absolute 0.1 0.0 - 0.1 K/uL  Comprehensive metabolic panel  Result Value Ref Range   Sodium 141 135 - 145 mEq/L   Potassium 4.2 3.5 - 5.1 mEq/L   Chloride 101 96 - 112 mEq/L   CO2 30 19 - 32 mEq/L   Glucose, Bld 92 70 - 99 mg/dL   BUN 11 6 - 23 mg/dL   Creatinine, Ser 9.62 0.40 - 1.20 mg/dL   Total Bilirubin 0.9 0.2 - 1.2 mg/dL   Alkaline Phosphatase 98 39 - 117 U/L   AST 34 0 - 37 U/L   ALT 35 0 - 35 U/L   Total Protein 6.7 6.0 - 8.3 g/dL   Albumin 3.7 3.5 - 5.2 g/dL   GFR 95.28 >41.32 mL/min   Calcium 9.4 8.4 - 10.5 mg/dL  Lipase  Result Value Ref Range   Lipase 9.0 (L) 11.0 - 59.0 U/L   Urinalysis, Routine  w reflex microscopic  Result Value Ref Range   Color, Urine YELLOW Yellow;Lt. Yellow;Straw;Dark Yellow;Amber;Green;Red;Brown   APPearance CLEAR Clear;Turbid;Slightly Cloudy;Cloudy   Specific Gravity, Urine 1.020 1.000 - 1.030   pH 6.0 5.0 - 8.0   Total Protein, Urine NEGATIVE Negative   Urine Glucose NEGATIVE Negative   Ketones, ur NEGATIVE Negative   Bilirubin Urine NEGATIVE Negative   Hgb urine dipstick NEGATIVE Negative   Urobilinogen, UA 0.2 0.0 - 1.0   Leukocytes,Ua NEGATIVE Negative   Nitrite NEGATIVE Negative   WBC, UA none seen 0-2/hpf   RBC / HPF none seen 0-2/hpf   Squamous Epithelial / HPF Rare(0-4/hpf) Rare(0-4/hpf)   Bacteria, UA Few(10-50/hpf) (A) None   Ca Oxalate Crys, UA Presence of (A) None      The 10-year ASCVD risk score (Arnett DK, et al., 2019) is: 10.6%    Assessment & Plan:   Cyst of pancreas -     MR ABDOMEN W WO CONTRAST; Future  Left upper quadrant abdominal pain -     Amylase -     CBC with Differential/Platelet -     Comprehensive metabolic panel -     Lipase -     Urinalysis, Routine w reflex microscopic  Left lower quadrant abdominal pain -     Amylase -     CBC with Differential/Platelet -     Comprehensive metabolic panel -     Lipase -     Urinalysis, Routine w reflex microscopic  Constipation, unspecified constipation type -     Polyethylene Glycol 3350; 1 scoop dissolved in fluid of choice daily until bowel movement.  Dispense: 255 g; Refill: 0  Diminished pulses in lower extremity -     VAS [place] ABI WITH/WO TBI; Future  Normocytic anemia -     Vitamin B12; Future -     Iron, TIBC and Ferritin Panel; Future    Return in about 1 week (around 03/11/2023), or if symptoms worsen or fail to improve.    Mliss Sax, MD

## 2023-03-05 DIAGNOSIS — R1032 Left lower quadrant pain: Secondary | ICD-10-CM | POA: Insufficient documentation

## 2023-03-05 DIAGNOSIS — K5900 Constipation, unspecified: Secondary | ICD-10-CM | POA: Insufficient documentation

## 2023-03-05 DIAGNOSIS — D649 Anemia, unspecified: Secondary | ICD-10-CM | POA: Insufficient documentation

## 2023-03-05 DIAGNOSIS — R1012 Left upper quadrant pain: Secondary | ICD-10-CM | POA: Insufficient documentation

## 2023-03-05 LAB — CBC WITH DIFFERENTIAL/PLATELET
Basophils Absolute: 0.1 10*3/uL (ref 0.0–0.1)
Basophils Relative: 1.1 % (ref 0.0–3.0)
Eosinophils Absolute: 0.2 10*3/uL (ref 0.0–0.7)
Eosinophils Relative: 2 % (ref 0.0–5.0)
HCT: 34.8 % — ABNORMAL LOW (ref 36.0–46.0)
Hemoglobin: 11.1 g/dL — ABNORMAL LOW (ref 12.0–15.0)
Lymphocytes Relative: 27 % (ref 12.0–46.0)
Lymphs Abs: 2.8 10*3/uL (ref 0.7–4.0)
MCHC: 31.9 g/dL (ref 30.0–36.0)
MCV: 83.1 fl (ref 78.0–100.0)
Monocytes Absolute: 0.9 10*3/uL (ref 0.1–1.0)
Monocytes Relative: 8.3 % (ref 3.0–12.0)
Neutro Abs: 6.5 10*3/uL (ref 1.4–7.7)
Neutrophils Relative %: 61.6 % (ref 43.0–77.0)
Platelets: 273 10*3/uL (ref 150.0–400.0)
RBC: 4.19 Mil/uL (ref 3.87–5.11)
RDW: 13.9 % (ref 11.5–15.5)
WBC: 10.5 10*3/uL (ref 4.0–10.5)

## 2023-03-05 LAB — URINALYSIS, ROUTINE W REFLEX MICROSCOPIC
Bilirubin Urine: NEGATIVE
Hgb urine dipstick: NEGATIVE
Ketones, ur: NEGATIVE
Leukocytes,Ua: NEGATIVE
Nitrite: NEGATIVE
RBC / HPF: NONE SEEN (ref 0–?)
Specific Gravity, Urine: 1.02 (ref 1.000–1.030)
Total Protein, Urine: NEGATIVE
Urine Glucose: NEGATIVE
Urobilinogen, UA: 0.2 (ref 0.0–1.0)
WBC, UA: NONE SEEN (ref 0–?)
pH: 6 (ref 5.0–8.0)

## 2023-03-05 LAB — COMPREHENSIVE METABOLIC PANEL
ALT: 35 U/L (ref 0–35)
AST: 34 U/L (ref 0–37)
Albumin: 3.7 g/dL (ref 3.5–5.2)
Alkaline Phosphatase: 98 U/L (ref 39–117)
BUN: 11 mg/dL (ref 6–23)
CO2: 30 mEq/L (ref 19–32)
Calcium: 9.4 mg/dL (ref 8.4–10.5)
Chloride: 101 meq/L (ref 96–112)
Creatinine, Ser: 0.81 mg/dL (ref 0.40–1.20)
GFR: 73.16 mL/min (ref >60.00)
Glucose, Bld: 92 mg/dL (ref 70–99)
Potassium: 4.2 meq/L (ref 3.5–5.1)
Sodium: 141 meq/L (ref 135–145)
Total Bilirubin: 0.9 mg/dL (ref 0.2–1.2)
Total Protein: 6.7 g/dL (ref 6.0–8.3)

## 2023-03-05 LAB — AMYLASE: Amylase: 31 U/L (ref 27–131)

## 2023-03-05 LAB — LIPASE: Lipase: 9 U/L — ABNORMAL LOW (ref 11.0–59.0)

## 2023-03-05 NOTE — Addendum Note (Signed)
Addended by: Andrez Grime on: 03/05/2023 05:36 PM   Modules accepted: Orders

## 2023-03-07 ENCOUNTER — Ambulatory Visit (HOSPITAL_BASED_OUTPATIENT_CLINIC_OR_DEPARTMENT_OTHER)
Admission: RE | Admit: 2023-03-07 | Discharge: 2023-03-07 | Disposition: A | Discharge status: Home / Self Care | Payer: Medicare Other | Source: Ambulatory Visit | Attending: Family Medicine | Admitting: Family Medicine

## 2023-03-07 DIAGNOSIS — K862 Cyst of pancreas: Secondary | ICD-10-CM | POA: Insufficient documentation

## 2023-03-07 MED ORDER — GADOBUTROL 1 MMOL/ML IV SOLN
10.0000 mL | Freq: Once | INTRAVENOUS | Status: AC | PRN
  Administered 2023-03-07: 10 mL via INTRAVENOUS

## 2023-03-11 ENCOUNTER — Encounter: Payer: Self-pay | Admitting: Family Medicine

## 2023-03-11 ENCOUNTER — Ambulatory Visit (INDEPENDENT_AMBULATORY_CARE_PROVIDER_SITE_OTHER): Payer: Medicare Other | Admitting: Family Medicine

## 2023-03-11 VITALS — BP 124/84 | HR 57 | Temp 97.8°F | Ht 65.0 in | Wt 233.6 lb

## 2023-03-11 DIAGNOSIS — K5900 Constipation, unspecified: Secondary | ICD-10-CM

## 2023-03-11 DIAGNOSIS — K862 Cyst of pancreas: Secondary | ICD-10-CM | POA: Diagnosis not present

## 2023-03-11 DIAGNOSIS — D649 Anemia, unspecified: Secondary | ICD-10-CM | POA: Diagnosis not present

## 2023-03-11 NOTE — Progress Notes (Signed)
Established Patient Office Visit   Subjective:  Patient ID: Tonya Bowers, female    DOB: 1951/06/08  Age: 72 y.o. MRN: [id number]  Chief Complaint  Patient presents with   Abdominal Pain    1 week follow up    Abdominal Pain Pertinent negatives include no constipation, hematuria, melena or myalgias.   Encounter Diagnoses  Name Primary?   Constipation, unspecified constipation type Yes   Cyst of pancreas    Normocytic anemia    Abdominal pain has resolved.  Relief of constipation with MiraLAX seem to help.  Follow-up MRI did show stable pancreatic cysts.  A follow-up MRI with an MRCP was advised for 2 years.   Review of Systems  Constitutional: Negative.   HENT: Negative.    Eyes:  Negative for blurred vision, discharge and redness.  Respiratory: Negative.    Cardiovascular: Negative.   Gastrointestinal:  Negative for abdominal pain, blood in stool, constipation and melena.  Genitourinary: Negative.  Negative for hematuria.  Musculoskeletal: Negative.  Negative for myalgias.  Skin:  Negative for rash.  Neurological:  Negative for tingling, loss of consciousness and weakness.  Endo/Heme/Allergies:  Negative for polydipsia.     Current Outpatient Medications:    benzonatate (TESSALON) 100 MG capsule, Take 1 capsule (100 mg total) by mouth every 8 (eight) hours. (Patient not taking: Reported on 03/04/2023), Disp: 21 capsule, Rfl: 0   calcium carbonate (OSCAL) 1500 (600 Ca) MG TABS tablet, Take 600 mg of elemental calcium by mouth 2 (two) times daily with a meal., Disp: , Rfl:    cholecalciferol (VITAMIN D3) 25 MCG (1000 UNIT) tablet, Take 1,000 Units by mouth daily., Disp: , Rfl:    estradiol (ESTRACE) 0.5 MG tablet, Take 0.5 mg by mouth daily., Disp: , Rfl: 2   irbesartan (AVAPRO) 150 MG tablet, TAKE 1 TABLET BY MOUTH DAILY, Disp: 100 tablet, Rfl: 2   Multiple Vitamins-Minerals (MULTIVITAMIN WITH MINERALS) tablet, Take 1 tablet by mouth daily., Disp: , Rfl:    polyethylene  glycol powder (MIRALAX) 17 GM/SCOOP powder, 1 scoop dissolved in fluid of choice daily until bowel movement., Disp: 255 g, Rfl: 0   pravastatin (PRAVACHOL) 20 MG tablet, TAKE 1 TABLET BY MOUTH AT  BEDTIME, Disp: 100 tablet, Rfl: 2   Objective:     BP 124/84   Pulse (!) 57   Temp 97.8 F (36.6 C)   Ht 5\' 5"  (1.651 m)   Wt 233 lb 9.6 oz (106 kg)   LMP 07/07/1995   SpO2 97%   BMI 38.87 kg/m    Physical Exam Constitutional:      General: She is not in acute distress.    Appearance: Normal appearance. She is not ill-appearing, toxic-appearing or diaphoretic.  HENT:     Head: Normocephalic and atraumatic.     Right Ear: External ear normal.     Left Ear: External ear normal.  Eyes:     General: No scleral icterus.       Right eye: No discharge.        Left eye: No discharge.     Extraocular Movements: Extraocular movements intact.     Conjunctiva/sclera: Conjunctivae normal.  Pulmonary:     Effort: Pulmonary effort is normal. No respiratory distress.  Skin:    General: Skin is warm and dry.  Neurological:     Mental Status: She is alert and oriented to person, place, and time.  Psychiatric:        Mood and Affect: Mood  normal.        Behavior: Behavior normal.      No results found for any visits on 03/11/23.    The 10-year ASCVD risk score (Arnett DK, et al., 2019) is: 9%    Assessment & Plan:   Constipation, unspecified constipation type  Cyst of pancreas -     MR ABDOMEN WITH MRCP W CONTRAST; Future  Normocytic anemia -     Iron, TIBC and Ferritin Panel -     B12 and Folate Panel    No follow-ups on file.  Follow-up MRI in 2 years ordered today.  Checking iron B12 and folate levels for normocytic anemia.  Continue MiraLAX as needed.  Information on iron rich diet was given.  Mliss Sax, MD

## 2023-03-12 LAB — IRON,TIBC AND FERRITIN PANEL
%SAT: 26 % (ref 16–45)
Ferritin: 63 ng/mL (ref 16–288)
Iron: 73 ug/dL (ref 45–160)
TIBC: 284 ug/dL (ref 250–450)

## 2023-03-12 LAB — B12 AND FOLATE PANEL
Folate: >24.2 ng/mL (ref >5.9)
Vitamin B-12: 982 pg/mL — ABNORMAL HIGH (ref 211–911)

## 2023-04-12 ENCOUNTER — Ambulatory Visit (HOSPITAL_COMMUNITY)
Admission: RE | Admit: 2023-04-12 | Discharge: 2023-04-12 | Disposition: A | Discharge status: Home / Self Care | Payer: Medicare Other | Source: Ambulatory Visit | Attending: Family Medicine | Admitting: Family Medicine

## 2023-04-12 DIAGNOSIS — R0989 Other specified symptoms and signs involving the circulatory and respiratory systems: Secondary | ICD-10-CM | POA: Insufficient documentation

## 2023-04-12 LAB — VAS US ABI WITH/WO TBI
Left ABI: 1.04
Right ABI: 1.02

## 2023-04-23 ENCOUNTER — Ambulatory Visit (INDEPENDENT_AMBULATORY_CARE_PROVIDER_SITE_OTHER): Payer: Medicare Other

## 2023-04-23 DIAGNOSIS — Z0000 Encounter for general adult medical examination without abnormal findings: Secondary | ICD-10-CM | POA: Diagnosis not present

## 2023-04-23 NOTE — Progress Notes (Signed)
Subjective:   Tonya Bowers is a 72 y.o. female who presents for Medicare Annual (Subsequent) preventive examination.  Visit Complete: Virtual I connected with  Tonya Bowers on 04/23/23 by a audio enabled telemedicine application and verified that I am speaking with the correct person using two identifiers.  Patient Location: Home  Provider Location: Office/Clinic  I discussed the limitations of evaluation and management by telemedicine. The patient expressed understanding and agreed to proceed.  Vital Signs: Because this visit was a virtual/telehealth visit, some criteria may be missing or patient reported. Any vitals not documented were not able to be obtained and vitals that have been documented are patient reported.  Patient Medicare AWV questionnaire was completed by the patient on 04/23/2023; I have confirmed that all information answered by patient is correct and no changes since this date.  Cardiac Risk Factors include: advanced age (>18men, >81 women);hypertension     Objective:    Today's Vitals   There is no height or weight on file to calculate BMI.     04/23/2023    2:28 PM 04/14/2022    2:18 PM 07/28/2021    3:08 PM 04/08/2021   11:52 AM 03/06/2020    3:19 PM 10/30/2016    7:49 AM 10/16/2016    8:17 AM  Advanced Directives  Does Patient Have a Medical Advance Directive? No Yes No No No No No  Type of Furniture conservator/restorer;Living will       Copy of Healthcare Power of Attorney in Chart?  No - copy requested       Would patient like information on creating a medical advance directive?   No - Patient declined No - Patient declined Yes (MAU/Ambulatory/Procedural Areas - Information given)      Current Medications (verified) Outpatient Encounter Medications as of 04/23/2023  Medication Sig   calcium carbonate (OSCAL) 1500 (600 Ca) MG TABS tablet Take 600 mg of elemental calcium by mouth 2 (two) times daily with a meal.   cholecalciferol  (VITAMIN D3) 25 MCG (1000 UNIT) tablet Take 1,000 Units by mouth daily.   estradiol (ESTRACE) 0.5 MG tablet Take 0.5 mg by mouth daily.   irbesartan (AVAPRO) 150 MG tablet TAKE 1 TABLET BY MOUTH DAILY   Multiple Vitamins-Minerals (MULTIVITAMIN WITH MINERALS) tablet Take 1 tablet by mouth daily.   polyethylene glycol powder (MIRALAX) 17 GM/SCOOP powder 1 scoop dissolved in fluid of choice daily until bowel movement.   pravastatin (PRAVACHOL) 20 MG tablet TAKE 1 TABLET BY MOUTH AT  BEDTIME   benzonatate (TESSALON) 100 MG capsule Take 1 capsule (100 mg total) by mouth every 8 (eight) hours. (Patient not taking: Reported on 03/04/2023)   No facility-administered encounter medications on file as of 04/23/2023.    Allergies (verified) Aldactone [spironolactone], Amlodipine, Lisinopril, Penicillins, and Thiazide-type diuretics   History: Past Medical History:  Diagnosis Date   Arthritis    Cataract    Diverticulosis    GERD (gastroesophageal reflux disease)    Hyperplastic colon polyp 09/09/06   Past Surgical History:  Procedure Laterality Date   ABDOMINAL HYSTERECTOMY     CHOLECYSTECTOMY     COLONOSCOPY     POLYPECTOMY     TUBAL LIGATION     Family History  Problem Relation Age of Onset   Hypertension Mother    Hypertension Father    Kidney disease Brother        kidney transplant    Hypertension Brother    Colon  cancer Neg Hx    Social History   Socioeconomic History   Marital status: Married    Spouse name: Not on file   Number of children: Not on file   Years of education: Not on file   Highest education level: Associate degree: academic program  Occupational History   Occupation: retired  Tobacco Use   Smoking status: Never   Smokeless tobacco: Never  Vaping Use   Vaping status: Never Used  Substance and Sexual Activity   Alcohol use: Yes    Comment: maybe occasionally; twice a year   Drug use: No   Sexual activity: Not on file  Other Topics Concern   Not on  file  Social History Narrative   Not on file   Social Determinants of Health   Financial Resource Strain: Low Risk  (04/23/2023)   Overall Financial Resource Strain (CARDIA)    Difficulty of Paying Living Expenses: Not hard at all  Food Insecurity: No Food Insecurity (04/23/2023)   Hunger Vital Sign    Worried About Running Out of Food in the Last Year: Never true    Ran Out of Food in the Last Year: Never true  Transportation Needs: No Transportation Needs (04/23/2023)   PRAPARE - Administrator, Civil Service (Medical): No    Lack of Transportation (Non-Medical): No  Physical Activity: Patient Declined (04/23/2023)   Exercise Vital Sign    Days of Exercise per Week: Patient declined    Minutes of Exercise per Session: Patient declined  Stress: No Stress Concern Present (04/23/2023)   [employer] of Occupational Health - Occupational Stress Questionnaire    Feeling of Stress : Not at all  Social Connections: Socially Integrated (04/23/2023)   Social Connection and Isolation Panel [NHANES]    Frequency of Communication with Friends and Family: More than three times a week    Frequency of Social Gatherings with Friends and Family: Once a week    Attends Religious Services: More than 4 times per year    Active Member of [employer] or Organizations: Yes    Attends Banker Meetings: 1 to 4 times per year    Marital Status: Married    Tobacco Counseling Counseling given: Not Answered   Clinical Intake:  Pre-visit preparation completed: Yes  Pain : No/denies pain     Nutritional Risks: None Diabetes: No  How often do you need to have someone help you when you read instructions, pamphlets, or other written materials from your doctor or pharmacy?: 1 - Never  Interpreter Needed?: No  Information entered by :: NAllen LPN   Activities of Daily Living    04/23/2023    1:26 PM  In your present state of health, do you have any difficulty  performing the following activities:  Hearing? 0  Vision? 0  Difficulty concentrating or making decisions? 0  Walking or climbing stairs? 0  Dressing or bathing? 0  Doing errands, shopping? 0  Preparing Food and eating ? N  Using the Toilet? N  In the past six months, have you accidently leaked urine? N  Do you have problems with loss of bowel control? N  Managing your Medications? N  Managing your Finances? N  Housekeeping or managing your Housekeeping? N    Patient Care Team: Mliss Sax, MD as PCP - General (Family Medicine)  Indicate any recent Medical Services you may have received from other than [facility] providers in the past year (date may be approximate).  Assessment:   This is a routine wellness examination for [place]han.  Hearing/Vision screen Hearing Screening - Comments:: Denies hearing issues Vision Screening - Comments:: Regular eye exams, Dr. Lucretia Roers   Goals Addressed             This Visit's Progress    Patient Stated       04/23/2023, stay healthy       Depression Screen    04/23/2023    2:29 PM 09/28/2022    3:59 PM 07/02/2022   10:31 AM 07/02/2022   10:30 AM 06/12/2022   10:31 AM 05/01/2022   10:20 AM 04/14/2022    2:17 PM  PHQ 2/9 Scores  PHQ - 2 Score 0 0 0 0 0 0 0  PHQ- 9 Score 0 0         Fall Risk    04/23/2023    1:26 PM 09/28/2022    3:59 PM 06/12/2022   10:54 AM 06/12/2022   10:31 AM 05/01/2022   10:20 AM  Fall Risk   Falls in the past year? 0 0 0 0 0  Number falls in past yr: 0 0 0 0 0  Injury with Fall? 0 0 0 0   Risk for fall due to : Medication side effect No Fall Risks No Fall Risks    Follow up Falls prevention discussed;Falls evaluation completed Falls evaluation completed Falls evaluation completed      MEDICARE RISK AT HOME: Medicare Risk at Home Any stairs in or around the home?: (P) Yes If so, are there any without handrails?: (P) No Home free of loose throw rugs in walkways, pet beds, electrical cords,  etc?: (P) Yes Adequate lighting in your home to reduce risk of falls?: (P) Yes Life alert?: (P) No Use of a cane, walker or w/c?: (P) No Grab bars in the bathroom?: (P) Yes Shower chair or bench in shower?: (P) No Elevated toilet seat or a handicapped toilet?: (P) Yes  TIMED UP AND GO:  Was the test performed?  No    Cognitive Function:        04/23/2023    2:29 PM 04/14/2022    2:19 PM  6CIT Screen  What Year? 0 points 0 points  What month? 0 points 0 points  What time? 0 points 0 points  Count back from 20 0 points 0 points  Months in reverse 0 points 0 points  Repeat phrase 0 points 0 points  Total Score 0 points 0 points    Immunizations Immunization History  Administered Date(s) Administered   Fluad Quad(high Dose 65+) 04/12/2019, 05/21/2020, 04/09/2021   Influenza Whole 04/22/2022   Influenza-Unspecified 05/03/2015, 04/11/2016, 04/08/2018, 04/22/2022   PFIZER(Purple Top)SARS-COV-2 Vaccination 08/11/2019, 09/05/2019, 04/20/2020   PNEUMOCOCCAL CONJUGATE-20 07/03/2021   Tdap 06/10/2016   Unspecified SARS-COV-2 Vaccination 11/29/2020    TDAP status: Up to date  Flu Vaccine status: Up to date  Pneumococcal vaccine status: Up to date  Covid-19 vaccine status: Completed vaccines  Qualifies for Shingles Vaccine? Yes   Zostavax completed No   Shingrix Completed?: No.    Education has been provided regarding the importance of this vaccine. Patient has been advised to call insurance company to determine out of pocket expense if they have not yet received this vaccine. Advised may also receive vaccine at local pharmacy or Health Dept. Verbalized acceptance and understanding.  Screening Tests Health Maintenance  Topic Date Due   COVID-19 Vaccine (5 - 2023-24 season) 03/07/2023   MAMMOGRAM  08/25/2023  Medicare Annual Wellness (AWV)  04/22/2024   DTaP/Tdap/Td (2 - Td or Tdap) 06/10/2026   Colonoscopy  10/31/2026   Pneumonia Vaccine 54+ Years old  Completed    INFLUENZA VACCINE  Completed   DEXA SCAN  Completed   Hepatitis C Screening  Completed   HPV VACCINES  Aged Out   Zoster Vaccines- Shingrix  Discontinued    Health Maintenance  Health Maintenance Due  Topic Date Due   COVID-19 Vaccine (5 - 2023-24 season) 03/07/2023    Colorectal cancer screening: Type of screening: Colonoscopy. Completed 10/30/2016. Repeat every 10 years  Mammogram status: Completed 08/24/2022. Repeat every year  Bone Density status: Completed 04/01/2020.   Lung Cancer Screening: (Low Dose CT Chest recommended if Age 53-80 years, 20 pack-year currently smoking OR have quit w/in 15years.) does not qualify.   Lung Cancer Screening Referral: no  Additional Screening:  Hepatitis C Screening: does qualify; Completed 03/25/2015  Vision Screening: Recommended annual ophthalmology exams for early detection of glaucoma and other disorders of the eye. Is the patient up to date with their annual eye exam?  Yes  Who is the provider or what is the name of the office in which the patient attends annual eye exams? Dr. Lucretia Roers If pt is not established with a provider, would they like to be referred to a provider to establish care? No .   Dental Screening: Recommended annual dental exams for proper oral hygiene  Diabetic Foot Exam: n/a  Community Resource Referral / Chronic Care Management: CRR required this visit?  No   CCM required this visit?  No     Plan:     I have personally reviewed and noted the following in the patient's chart:   Medical and social history Use of alcohol, tobacco or illicit drugs  Current medications and supplements including opioid prescriptions. Patient is not currently taking opioid prescriptions. Functional ability and status Nutritional status Physical activity Advanced directives List of other physicians Hospitalizations, surgeries, and ER visits in previous 12 months Vitals Screenings to include cognitive, depression, and  falls Referrals and appointments  In addition, I have reviewed and discussed with patient certain preventive protocols, quality metrics, and best practice recommendations. A written personalized care plan for preventive services as well as general preventive health recommendations were provided to patient.     Barb Merino, LPN   [id number]   After Visit Summary: (MyChart) Due to this being a telephonic visit, the after visit summary with patients personalized plan was offered to patient via MyChart   Nurse Notes: none

## 2023-04-23 NOTE — Patient Instructions (Addendum)
Tonya Bowers , Thank you for taking time to come for your Medicare Wellness Visit. I appreciate your ongoing commitment to your health goals. Please review the following plan we discussed and let me know if I can assist you in the future.   Referrals/Orders/Follow-Ups/Clinician Recommendations: none  This is a list of the screening recommended for you and due dates:  Health Maintenance  Topic Date Due   COVID-19 Vaccine (6 - 2023-24 season) 06/11/2023   Mammogram  08/25/2023   Medicare Annual Wellness Visit  04/22/2024   DTaP/Tdap/Td vaccine (2 - Td or Tdap) 06/10/2026   Colon Cancer Screening  10/31/2026   Pneumonia Vaccine  Completed   Flu Shot  Completed   DEXA scan (bone density measurement)  Completed   Hepatitis C Screening  Completed   HPV Vaccine  Aged Out   Zoster (Shingles) Vaccine  Discontinued    Advanced directives: (Copy Requested) Please bring a copy of your health care power of attorney and living will to the office to be added to your chart at your convenience.  Next Medicare Annual Wellness Visit scheduled for next year: Yes  Insert Preventive Care attachment Insert FALL PREVENTION attachment if needed

## 2023-05-13 DIAGNOSIS — Z1231 Encounter for screening mammogram for malignant neoplasm of breast: Secondary | ICD-10-CM | POA: Diagnosis not present

## 2023-06-19 ENCOUNTER — Other Ambulatory Visit: Payer: Self-pay | Admitting: Family Medicine

## 2023-06-19 DIAGNOSIS — I10 Essential (primary) hypertension: Secondary | ICD-10-CM

## 2023-06-28 ENCOUNTER — Other Ambulatory Visit: Payer: Self-pay

## 2023-06-28 ENCOUNTER — Encounter (HOSPITAL_BASED_OUTPATIENT_CLINIC_OR_DEPARTMENT_OTHER): Payer: Self-pay

## 2023-06-28 ENCOUNTER — Observation Stay (HOSPITAL_BASED_OUTPATIENT_CLINIC_OR_DEPARTMENT_OTHER)
Admission: EM | Admit: 2023-06-28 | Discharge: 2023-06-29 | Disposition: A | Discharge status: Home / Self Care | Payer: Medicare Other | Attending: Internal Medicine | Admitting: Internal Medicine

## 2023-06-28 ENCOUNTER — Emergency Department (HOSPITAL_BASED_OUTPATIENT_CLINIC_OR_DEPARTMENT_OTHER): Payer: Medicare Other

## 2023-06-28 DIAGNOSIS — E785 Hyperlipidemia, unspecified: Secondary | ICD-10-CM | POA: Diagnosis present

## 2023-06-28 DIAGNOSIS — I10 Essential (primary) hypertension: Secondary | ICD-10-CM | POA: Diagnosis not present

## 2023-06-28 DIAGNOSIS — Z79899 Other long term (current) drug therapy: Secondary | ICD-10-CM | POA: Diagnosis not present

## 2023-06-28 DIAGNOSIS — K8689 Other specified diseases of pancreas: Principal | ICD-10-CM | POA: Diagnosis present

## 2023-06-28 DIAGNOSIS — K869 Disease of pancreas, unspecified: Secondary | ICD-10-CM | POA: Diagnosis not present

## 2023-06-28 DIAGNOSIS — K5730 Diverticulosis of large intestine without perforation or abscess without bleeding: Secondary | ICD-10-CM | POA: Diagnosis not present

## 2023-06-28 DIAGNOSIS — D490 Neoplasm of unspecified behavior of digestive system: Secondary | ICD-10-CM | POA: Diagnosis present

## 2023-06-28 DIAGNOSIS — K219 Gastro-esophageal reflux disease without esophagitis: Secondary | ICD-10-CM | POA: Insufficient documentation

## 2023-06-28 DIAGNOSIS — Z6839 Body mass index (BMI) 39.0-39.9, adult: Secondary | ICD-10-CM | POA: Diagnosis not present

## 2023-06-28 DIAGNOSIS — R7401 Elevation of levels of liver transaminase levels: Secondary | ICD-10-CM | POA: Diagnosis not present

## 2023-06-28 DIAGNOSIS — R1032 Left lower quadrant pain: Secondary | ICD-10-CM | POA: Diagnosis present

## 2023-06-28 DIAGNOSIS — C259 Malignant neoplasm of pancreas, unspecified: Secondary | ICD-10-CM | POA: Insufficient documentation

## 2023-06-28 DIAGNOSIS — K838 Other specified diseases of biliary tract: Secondary | ICD-10-CM | POA: Diagnosis not present

## 2023-06-28 DIAGNOSIS — F1090 Alcohol use, unspecified, uncomplicated: Secondary | ICD-10-CM | POA: Insufficient documentation

## 2023-06-28 DIAGNOSIS — E669 Obesity, unspecified: Secondary | ICD-10-CM | POA: Insufficient documentation

## 2023-06-28 DIAGNOSIS — R935 Abnormal findings on diagnostic imaging of other abdominal regions, including retroperitoneum: Secondary | ICD-10-CM | POA: Diagnosis not present

## 2023-06-28 LAB — URINALYSIS, ROUTINE W REFLEX MICROSCOPIC
Glucose, UA: NEGATIVE mg/dL
Hgb urine dipstick: NEGATIVE
Ketones, ur: NEGATIVE mg/dL
Leukocytes,Ua: NEGATIVE
Nitrite: NEGATIVE
Protein, ur: NEGATIVE mg/dL
Specific Gravity, Urine: 1.015 (ref 1.005–1.030)
pH: 6 (ref 5.0–8.0)

## 2023-06-28 LAB — CBC
HCT: 35.3 % — ABNORMAL LOW (ref 36.0–46.0)
Hemoglobin: 11.7 g/dL — ABNORMAL LOW (ref 12.0–15.0)
MCH: 27 pg (ref 26.0–34.0)
MCHC: 33.1 g/dL (ref 30.0–36.0)
MCV: 81.3 fL (ref 80.0–100.0)
Platelets: 258 10*3/uL (ref 150–400)
RBC: 4.34 MIL/uL (ref 3.87–5.11)
RDW: 15.3 % (ref 11.5–15.5)
WBC: 8.3 10*3/uL (ref 4.0–10.5)
nRBC: 0 % (ref 0.0–0.2)

## 2023-06-28 LAB — COMPREHENSIVE METABOLIC PANEL
ALT: 303 U/L — ABNORMAL HIGH (ref 0–44)
AST: 167 U/L — ABNORMAL HIGH (ref 15–41)
Albumin: 3.6 g/dL (ref 3.5–5.0)
Alkaline Phosphatase: 405 U/L — ABNORMAL HIGH (ref 38–126)
Anion gap: 8 (ref 5–15)
BUN: 13 mg/dL (ref 8–23)
CO2: 27 mmol/L (ref 22–32)
Calcium: 9.1 mg/dL (ref 8.9–10.3)
Chloride: 101 mmol/L (ref 98–111)
Creatinine, Ser: 0.81 mg/dL (ref 0.44–1.00)
GFR, Estimated: >60 mL/min (ref >60)
Glucose, Bld: 119 mg/dL — ABNORMAL HIGH (ref 70–99)
Potassium: 3.8 mmol/L (ref 3.5–5.1)
Sodium: 136 mmol/L (ref 135–145)
Total Bilirubin: 5.8 mg/dL — ABNORMAL HIGH (ref <1.2)
Total Protein: 7.4 g/dL (ref 6.5–8.1)

## 2023-06-28 LAB — ACETAMINOPHEN LEVEL: Acetaminophen (Tylenol), Serum: <10 ug/mL — ABNORMAL LOW (ref 10–30)

## 2023-06-28 LAB — PROTIME-INR
INR: 1 (ref 0.8–1.2)
Prothrombin Time: 13 s (ref 11.4–15.2)

## 2023-06-28 LAB — LIPASE, BLOOD: Lipase: 521 U/L — ABNORMAL HIGH (ref 11–51)

## 2023-06-28 MED ORDER — OXYCODONE HCL 5 MG PO TABS: 5.0000 mg | ORAL_TABLET | ORAL | Status: DC | PRN

## 2023-06-28 MED ORDER — ONDANSETRON HCL 4 MG/2ML IJ SOLN: 4.0000 mg | Freq: Four times a day (QID) | INTRAMUSCULAR | Status: DC | PRN

## 2023-06-28 MED ORDER — IOHEXOL 300 MG/ML  SOLN
100.0000 mL | Freq: Once | INTRAMUSCULAR | Status: AC | PRN
  Administered 2023-06-28: 100 mL via INTRAVENOUS

## 2023-06-28 MED ORDER — TRAMADOL HCL 50 MG PO TABS: 50.0000 mg | ORAL_TABLET | Freq: Three times a day (TID) | ORAL | Status: DC | PRN

## 2023-06-28 MED ORDER — IRBESARTAN 150 MG PO TABS
150.0000 mg | ORAL_TABLET | Freq: Every day | ORAL | Status: DC
Start: 2023-06-29 — End: 2023-06-29
  Administered 2023-06-29: 150 mg via ORAL
  Filled 2023-06-28: qty 1

## 2023-06-28 MED ORDER — HYDRALAZINE HCL 20 MG/ML IJ SOLN
10.0000 mg | INTRAMUSCULAR | Status: DC | PRN
  Administered 2023-06-28: 10 mg via INTRAVENOUS
  Filled 2023-06-28: qty 1

## 2023-06-28 MED ORDER — ONDANSETRON HCL 4 MG PO TABS: 4.0000 mg | ORAL_TABLET | Freq: Four times a day (QID) | ORAL | Status: DC | PRN

## 2023-06-28 MED ORDER — SENNOSIDES-DOCUSATE SODIUM 8.6-50 MG PO TABS: 1.0000 | ORAL_TABLET | Freq: Every evening | ORAL | Status: DC | PRN

## 2023-06-28 NOTE — ED Triage Notes (Signed)
Pt reports that her urine has been a different color x 1 week. No new medication. Also reports left side pain for more than a week.

## 2023-06-28 NOTE — ED Notes (Signed)
Pt. Reports L flank and L side pain under the L rib cage area.  Reports this pain has been checked before. Pt. Reports she is here due to orange urine today and related pain.

## 2023-06-28 NOTE — H&P (Signed)
History and Physical    Tonya Bowers SAY:[id number] DOB: 1950-07-18 DOA: 06/28/2023  PCP: Mliss Sax, MD  Patient coming from: Home  I have personally briefly reviewed patient's old medical records in [facility]  Chief Complaint: Left flank pain  HPI: Tonya Bowers is a 72 y.o. female with medical history significant for pancreatic cysts, prior cholecystectomy, HTN who presented to the ED for evaluation of 1 week of orange urine, left side flank pain, clay colored stools.  Patient states that 1 week ago she began to notice orange-colored urine, clay colored stools, and more recently has been having some left-sided flank pain.  She was has not had any abdominal pain, nausea, vomiting, fevers, chills, diaphoresis, change in weight, chest pain, dyspnea.  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  No pancreatic ductal dilation or evidence of acute inflammation were seen at that time.  [facility] ED Course  Labs/Imaging on admission: I have personally reviewed following labs and imaging studies.  Initial vitals showed BP 196/83, pulse 65, RR 18, temp 98.2 F, SpO2 100% on room air.  Labs showed lipase 521, AST 167, ALT 303, alk phos 405, total bilirubin 5.8.  Sodium 136, potassium 3.8, bicarb 27, BUN 13, creatinine 0.1, serum glucose 119.  WBC 8.3, hemoglobin 11.7, platelets 258,000.  Acute hepatitis panel in process.  Acetaminophen level undetectable.  CT abdomen/pelvis with contrast showed subtle, ill-defined hypodensity in the pancreatic uncinate process measuring 2.6 x 2.6 cm.  Suspicious for pancreatic neoplasm.  Focal pancreatitis is considered much less likely.  New mild intrahepatic bile duct dilation, CBD dilation, and main pancreatic duct dilation compared to 03/07/2023.  Colonic diverticulosis without acute diverticulitis noted.  EDP discussed with [facility] Dr. Adela Lank  who recommended medical admission, they will consult with plan for possible ERCP tomorrow.  The hospitalist service was consulted to admit.  Review of Systems: All systems reviewed and are negative except as documented in history of present illness above.   Past Medical History:  Diagnosis Date   Arthritis    Cataract    Diverticulosis    GERD (gastroesophageal reflux disease)    Hyperplastic colon polyp 09/09/06    Past Surgical History:  Procedure Laterality Date   ABDOMINAL HYSTERECTOMY     CHOLECYSTECTOMY     COLONOSCOPY     POLYPECTOMY     TUBAL LIGATION      Social History:  reports that she has never smoked. She has never used smokeless tobacco. She reports current alcohol use. She reports that she does not use drugs.  Allergies  Allergen Reactions   Aldactone [Spironolactone] Other (See Comments)    Breast tenderness    Amlodipine Other (See Comments)    Hot flash    Lisinopril Cough   Penicillins    Thiazide-Type Diuretics Other (See Comments)    Hypokalemia.    Family History  Problem Relation Age of Onset   Hypertension Mother    Hypertension Father    Kidney disease Brother        kidney transplant    Hypertension Brother    Colon cancer Neg Hx      Prior to Admission medications   Medication Sig Start Date End Date Taking? Authorizing Provider  calcium carbonate (OSCAL) 1500 (600 Ca) MG TABS tablet Take 600 mg of elemental calcium by mouth 2 (two) times daily with a meal.   Yes [provider]  cholecalciferol (VITAMIN D3) 25 MCG (1000 UNIT) tablet Take 1,000 Units by mouth daily.   Yes [provider]  irbesartan (AVAPRO) 150 MG tablet TAKE 1 TABLET BY MOUTH DAILY 06/21/23  Yes Mliss Sax, MD  Multiple Vitamins-Minerals (MULTIVITAMIN WITH MINERALS) tablet Take 1 tablet by mouth daily.   Yes [provider]  polyethylene glycol powder (MIRALAX) 17 GM/SCOOP powder 1 scoop dissolved in fluid of choice daily until  bowel movement. 03/04/23  Yes Mliss Sax, MD  pravastatin (PRAVACHOL) 20 MG tablet TAKE 1 TABLET BY MOUTH AT  BEDTIME 08/24/22  Yes Mliss Sax, MD  benzonatate (TESSALON) 100 MG capsule Take 1 capsule (100 mg total) by mouth every 8 (eight) hours. Patient not taking: Reported on 03/04/2023 02/13/23   Radford Pax, NP  estradiol (ESTRACE) 0.5 MG tablet Take 0.5 mg by mouth daily. 03/24/18   [provider]    Physical Exam: Vitals:   06/28/23 2052 06/28/23 2056 06/28/23 2100 06/28/23 2258  BP: (!) 181/67  (!) 187/60 (!) 190/71  Pulse: 80  69 68  Resp: 16  17 17   Temp: 98 F (36.7 C) 98 F (36.7 C)  98.4 F (36.9 C)  TempSrc: Oral   Oral  SpO2: 100%  100% 99%  Weight:      Height:       Constitutional: Sitting up in bed, NAD, calm, comfortable Eyes: EOMI, lids and conjunctivae normal ENMT: Mucous membranes are moist. Posterior pharynx clear of any exudate or lesions.Normal dentition.  Neck: normal, supple, no masses. Respiratory: clear to auscultation bilaterally, no wheezing, no crackles. Normal respiratory effort. No accessory muscle use.  Cardiovascular: Regular rate and rhythm, no murmurs / rubs / gallops. No extremity edema. 2+ pedal pulses. Abdomen: no tenderness, no masses palpated.  Musculoskeletal: no clubbing / cyanosis. No joint deformity upper and lower extremities. Good ROM, no contractures. Normal muscle tone.  Skin: no rashes, lesions, ulcers. No induration Neurologic: Sensation intact. Strength 5/5 in all 4.  Psychiatric: Normal judgment and insight. Alert and oriented x 3. Normal mood.   EKG: Not performed.  Assessment/Plan Principal Problem:   Pancreatic neoplasm Active Problems:   Essential hypertension   Transaminitis   Hyperlipidemia   Tonya Bowers is a 72 y.o. female with medical history significant for pancreatic cyst, prior cholecystectomy who is admitted for evaluation of suspected pancreatic neoplasm associated with  transaminitis.  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  New mild bile duct and main pancreatic duct dilation noted as well. -[facility] to consult in a.m. -Possible ERCP tomorrow -Keep n.p.o. after midnight -Follow CA 19-9, CEA, AFP  Hypertension: Continue irbesartan.  IV hydralazine as needed.  Hyperlipidemia: Holding statin.   DVT prophylaxis: SCDs Start: 06/28/23 2331 Code Status: Full code, confirmed with patient on admission Family Communication: Spouse at bedside Disposition Plan: Pending clinical progress Consults called: Lower GI Severity of Illness: The appropriate patient status for this patient is OBSERVATION. Observation status is judged to be reasonable and necessary in order to provide the required intensity of service to ensure the patient's safety. The patient's presenting symptoms, physical exam findings, and initial radiographic and laboratory data in the context of their medical condition is felt to place them at decreased risk for further clinical deterioration. Furthermore, it is anticipated that the patient will be medically stable for discharge from the hospital within 2 midnights of admission.  Darreld Mclean MD [facility]  If 7PM-7AM, please contact night-coverage [email or web address]  06/28/2023, 11:33 PM

## 2023-06-28 NOTE — ED Notes (Signed)
Called to give report.   RN at [facility] [address] will call RN Earlene Plater back

## 2023-06-28 NOTE — ED Provider Notes (Signed)
.  Ultrasound ED Peripheral IV (Provider)  Date/Time: 06/28/2023 3:36 PM  Performed by: Netta Corrigan, PA-C Authorized by: Netta Corrigan, PA-C   Procedure details:    Indications: multiple failed IV attempts     Skin Prep: isopropyl alcohol     Location:  Right AC   Angiocath:  20 G   Bedside Ultrasound Guided: Yes     Images: archived     Patient tolerated procedure without complications: Yes     Dressing applied: Yes       Netta Corrigan, PA-C 06/28/23 1536    Rolan Bucco, MD 06/28/23 1610

## 2023-06-28 NOTE — ED Provider Notes (Signed)
[facility] Provider Note   CSN: [id number] Arrival date & time: 06/28/23  1438     History  Chief Complaint  Patient presents with   Abdominal Pain    Tonya Bowers is a 72 y.o. female past medical history significant for hepatic steatosis and cholecystectomy presents today for approximately 1 week of orange urine, left side pain/flank, and clay colored stools.  Patient denies any dysuria, nausea, vomiting, diarrhea, chest pain, shortness of breath, fever, chills, or cough.  Also denies any changes in medications.   Abdominal Pain      Home Medications Prior to Admission medications   Medication Sig Start Date End Date Taking? Authorizing Provider  calcium carbonate (OSCAL) 1500 (600 Ca) MG TABS tablet Take 600 mg of elemental calcium by mouth 2 (two) times daily with a meal.   Yes [provider]  cholecalciferol (VITAMIN D3) 25 MCG (1000 UNIT) tablet Take 1,000 Units by mouth daily.   Yes [provider]  irbesartan (AVAPRO) 150 MG tablet TAKE 1 TABLET BY MOUTH DAILY 06/21/23  Yes Mliss Sax, MD  Multiple Vitamins-Minerals (MULTIVITAMIN WITH MINERALS) tablet Take 1 tablet by mouth daily.   Yes [provider]  polyethylene glycol powder (MIRALAX) 17 GM/SCOOP powder 1 scoop dissolved in fluid of choice daily until bowel movement. 03/04/23  Yes Mliss Sax, MD  pravastatin (PRAVACHOL) 20 MG tablet TAKE 1 TABLET BY MOUTH AT  BEDTIME 08/24/22  Yes Mliss Sax, MD  benzonatate (TESSALON) 100 MG capsule Take 1 capsule (100 mg total) by mouth every 8 (eight) hours. Patient not taking: Reported on 03/04/2023 02/13/23   Radford Pax, NP  estradiol (ESTRACE) 0.5 MG tablet Take 0.5 mg by mouth daily. 03/24/18   [provider]      Allergies    Aldactone [spironolactone], Amlodipine, Lisinopril, Penicillins, and Thiazide-type diuretics    Review of Systems   Review of  Systems  Gastrointestinal:  Positive for abdominal pain.       Clay colored stools  Genitourinary:  Positive for flank pain.       Orange urine    Physical Exam Updated Vital Signs BP (!) 181/67   Pulse 80   Temp 98 F (36.7 C)   Resp 16   Ht 5\' 5"  (1.651 m)   Wt 106.6 kg   LMP 07/07/1995   SpO2 100%   BMI 39.11 kg/m  Physical Exam Vitals and nursing note reviewed.  Constitutional:      General: She is not in acute distress.    Appearance: She is well-developed. She is not ill-appearing.  HENT:     Head: Normocephalic and atraumatic.  Eyes:     Extraocular Movements: Extraocular movements intact.     Conjunctiva/sclera: Conjunctivae normal.  Cardiovascular:     Rate and Rhythm: Normal rate and regular rhythm.     Heart sounds: Normal heart sounds. No murmur heard. Pulmonary:     Effort: Pulmonary effort is normal. No respiratory distress.     Breath sounds: Normal breath sounds.  Abdominal:     General: Abdomen is protuberant. Bowel sounds are normal.     Palpations: Abdomen is soft.     Tenderness: There is no abdominal tenderness. There is no rebound. Negative signs include Murphy's sign, Rovsing's sign and McBurney's sign.     Comments: Patient has left flank wrapping around under left rib pain, which is mildly tender to palpation.  Musculoskeletal:  General: No swelling.     Cervical back: Neck supple.  Skin:    General: Skin is warm and dry.     Capillary Refill: Capillary refill takes less than 2 seconds.     Coloration: Skin is not jaundiced.  Neurological:     General: No focal deficit present.     Mental Status: She is alert.  Psychiatric:        Mood and Affect: Mood normal.     ED Results / Procedures / Treatments   Labs (all labs ordered are listed, but only abnormal results are displayed) Labs Reviewed  LIPASE, BLOOD - Abnormal; Notable for the following components:      Result Value   Lipase 521 (*)    All other components within  normal limits  COMPREHENSIVE METABOLIC PANEL - Abnormal; Notable for the following components:   Glucose, Bld 119 (*)    AST 167 (*)    ALT 303 (*)    Alkaline Phosphatase 405 (*)    Total Bilirubin 5.8 (*)    All other components within normal limits  CBC - Abnormal; Notable for the following components:   Hemoglobin 11.7 (*)    HCT 35.3 (*)    All other components within normal limits  URINALYSIS, ROUTINE W REFLEX MICROSCOPIC - Abnormal; Notable for the following components:   Bilirubin Urine MODERATE (*)    All other components within normal limits  ACETAMINOPHEN LEVEL - Abnormal; Notable for the following components:   Acetaminophen (Tylenol), Serum <10 (*)    All other components within normal limits  PROTIME-INR  HEPATITIS PANEL, ACUTE  CANCER ANTIGEN 19-9  CEA  AFP TUMOR MARKER    EKG None  Radiology CT ABDOMEN PELVIS W CONTRAST Result Date: 06/28/2023 CLINICAL DATA:  One-week history of left-sided abdominal pain and discolored urine EXAM: CT ABDOMEN AND PELVIS WITH CONTRAST TECHNIQUE: Multidetector CT imaging of the abdomen and pelvis was performed using the standard protocol following bolus administration of intravenous contrast. RADIATION DOSE REDUCTION: This exam was performed according to the departmental dose-optimization program which includes automated exposure control, adjustment of the mA and/or kV according to patient size and/or use of iterative reconstruction technique. CONTRAST:  OMNIPAQUE IOHEXOL 300 MG/ML  SOLN COMPARISON:  MR abdomen dated 03/07/2023, CT abdomen dated 09/10/2011 FINDINGS: Lower chest: 5 mm subpleural right lower lobe nodule (302:5), unchanged from 2013, likely benign. No specific follow-up imaging recommended. No pleural effusion or pneumothorax demonstrated. Partially imaged heart size is normal. Hepatobiliary: No focal hepatic lesions. Increased prominence of mild intrahepatic bile duct dilation. Common bile duct measures 10 mm,  previously 5 mm. Cholecystectomy. Pancreas: Subtle, ill-defined hypodensity in the pancreatic uncinate process measures 2.6 x 2.6 cm (301:35). Main pancreatic duct measures 4 mm, previously 2 mm. Spleen: Normal in size without focal abnormality. Adrenals/Urinary Tract: No adrenal nodules. No suspicious renal mass, calculi or hydronephrosis. No focal bladder wall thickening. Stomach/Bowel: Normal appearance of the stomach. No evidence of bowel wall thickening, distention, or inflammatory changes. Colonic diverticulosis without acute diverticulitis. Appendix is not discretely seen. Vascular/Lymphatic: Aortic atherosclerosis. No enlarged abdominal or pelvic lymph nodes. Reproductive: No adnexal masses. Other: No free fluid, fluid collection, or free air. Musculoskeletal: No acute or abnormal lytic or blastic osseous lesions. IMPRESSION: 1. Subtle, ill-defined hypodensity in the pancreatic uncinate process measures 2.6 x 2.6 cm, suspicious for pancreatic neoplasm. Focal pancreatitis is considered much less likely. 2. New mild intrahepatic bile duct dilation, common bile duct dilation, and main pancreatic duct  dilation compared to 03/07/2023. 3. Colonic diverticulosis without acute diverticulitis. 4.  Aortic Atherosclerosis (ICD10-I70.0). Electronically Signed   By: Agustin Cree M.D.   On: 06/28/2023 19:02    Procedures Procedures    Medications Ordered in ED Medications  iohexol (OMNIPAQUE) 300 MG/ML solution 100 mL (100 mLs Intravenous Contrast Given 06/28/23 1739)    ED Course/ Medical Decision Making/ A&P                                 Medical Decision Making Amount and/or Complexity of Data Reviewed Labs: ordered. Radiology: ordered.  Risk Prescription drug management.   This patient presents to the ED with chief complaint(s) of orange urine and left side pain with pertinent past medical history of left mid back pain which further complicates the presenting complaint. The complaint involves an  extensive differential diagnosis and also carries with it a high risk of complications and morbidity.    The differential diagnosis includes kidney stone, UTI, pyelonephritis  Additional history obtained: Additional history obtained from spouse Records reviewed Primary Care Documents  ED Course and Reassessment: Patient does state that she takes Tylenol arthritis once daily.  Independent labs interpretation:  The following labs were independently interpreted:  CBC: Mild anemia which is chronic per historical values CMP: Elevated alk phos at 405, elevated AST at 167, elevated ALT at 303, elevated total bili at 5.8 Lipase: 521 UA: Moderate bilirubin Protime-INR: 13.0 and 1.0 Acetaminophen level: <10 Hepatic panel: Pending  Independent visualization of imaging: - I independently visualized the following imaging with scope of interpretation limited to determining acute life threatening conditions related to emergency care: CT abdomen pelvis with contrast, which revealed subtle, ill-defined hypodensity in the pancreatic [employer] process measuring 2.6 x 2.6 cm, suspicious for pancreatic neoplasm.  Focal pancreatitis is considered much less likely.  New mild intrahepatic bile duct dilation, common bile duct dilation and main pancreatic duct dilation compared to 03/07/2023.  Consultation: - Consulted or discussed management/test interpretation w/ external professional: GI, Dr. Adela Lank who felt the patient should be admitted to hospitalist and they would consult on the patient with likely ERCP tomorrow and Hospitalist Dr. Julian Reil who is agreeable to admission with GI rounding.  Consideration for admission or further workup: Patient being admitted for possible pancreatic neoplasm.        Final Clinical Impression(s) / ED Diagnoses Final diagnoses:  Pancreatic mass    Rx / DC Orders ED Discharge Orders     None         Gretta Began 06/28/23 2126    Rolan Bucco,  MD 06/28/23 2333

## 2023-06-28 NOTE — ED Notes (Signed)
[facility] called for transport, No ETA ED Nurse will call floor for report Called @ 21:30

## 2023-06-28 NOTE — ED Notes (Signed)
Labs ordered are to be picked up by [employer] to be drawn at [facility] when Pt. Is admitted.

## 2023-06-28 NOTE — Hospital Course (Signed)
Tonya Bowers is a 72 y.o. female with medical history significant for pancreatic cyst, prior cholecystectomy who is admitted for evaluation of suspected pancreatic neoplasm associated with transaminitis.

## 2023-06-29 ENCOUNTER — Other Ambulatory Visit: Payer: Self-pay | Admitting: Physician Assistant

## 2023-06-29 ENCOUNTER — Observation Stay (HOSPITAL_COMMUNITY): Payer: Medicare Other

## 2023-06-29 DIAGNOSIS — R7989 Other specified abnormal findings of blood chemistry: Secondary | ICD-10-CM | POA: Diagnosis not present

## 2023-06-29 DIAGNOSIS — R7401 Elevation of levels of liver transaminase levels: Secondary | ICD-10-CM | POA: Diagnosis not present

## 2023-06-29 DIAGNOSIS — R1900 Intra-abdominal and pelvic swelling, mass and lump, unspecified site: Secondary | ICD-10-CM | POA: Diagnosis not present

## 2023-06-29 DIAGNOSIS — E785 Hyperlipidemia, unspecified: Secondary | ICD-10-CM | POA: Diagnosis not present

## 2023-06-29 DIAGNOSIS — R918 Other nonspecific abnormal finding of lung field: Secondary | ICD-10-CM | POA: Diagnosis not present

## 2023-06-29 DIAGNOSIS — K838 Other specified diseases of biliary tract: Secondary | ICD-10-CM

## 2023-06-29 DIAGNOSIS — C259 Malignant neoplasm of pancreas, unspecified: Secondary | ICD-10-CM | POA: Diagnosis not present

## 2023-06-29 DIAGNOSIS — Z79899 Other long term (current) drug therapy: Secondary | ICD-10-CM | POA: Diagnosis not present

## 2023-06-29 DIAGNOSIS — R195 Other fecal abnormalities: Secondary | ICD-10-CM

## 2023-06-29 DIAGNOSIS — R933 Abnormal findings on diagnostic imaging of other parts of digestive tract: Secondary | ICD-10-CM | POA: Diagnosis not present

## 2023-06-29 DIAGNOSIS — E8729 Other acidosis: Secondary | ICD-10-CM | POA: Diagnosis not present

## 2023-06-29 DIAGNOSIS — I10 Essential (primary) hypertension: Secondary | ICD-10-CM | POA: Diagnosis not present

## 2023-06-29 DIAGNOSIS — R591 Generalized enlarged lymph nodes: Secondary | ICD-10-CM

## 2023-06-29 DIAGNOSIS — R17 Unspecified jaundice: Secondary | ICD-10-CM

## 2023-06-29 DIAGNOSIS — K8689 Other specified diseases of pancreas: Secondary | ICD-10-CM

## 2023-06-29 DIAGNOSIS — K869 Disease of pancreas, unspecified: Secondary | ICD-10-CM | POA: Diagnosis not present

## 2023-06-29 DIAGNOSIS — Z880 Allergy status to penicillin: Secondary | ICD-10-CM | POA: Diagnosis not present

## 2023-06-29 DIAGNOSIS — R911 Solitary pulmonary nodule: Secondary | ICD-10-CM | POA: Diagnosis not present

## 2023-06-29 DIAGNOSIS — R599 Enlarged lymph nodes, unspecified: Secondary | ICD-10-CM | POA: Diagnosis not present

## 2023-06-29 DIAGNOSIS — K219 Gastro-esophageal reflux disease without esophagitis: Secondary | ICD-10-CM | POA: Diagnosis not present

## 2023-06-29 DIAGNOSIS — R8289 Other abnormal findings on cytological and histological examination of urine: Secondary | ICD-10-CM | POA: Diagnosis not present

## 2023-06-29 DIAGNOSIS — K831 Obstruction of bile duct: Secondary | ICD-10-CM | POA: Diagnosis not present

## 2023-06-29 LAB — COMPREHENSIVE METABOLIC PANEL
ALT: 254 U/L — ABNORMAL HIGH (ref 0–44)
AST: 143 U/L — ABNORMAL HIGH (ref 15–41)
Albumin: 3 g/dL — ABNORMAL LOW (ref 3.5–5.0)
Alkaline Phosphatase: 386 U/L — ABNORMAL HIGH (ref 38–126)
Anion gap: 8 (ref 5–15)
BUN: 9 mg/dL (ref 8–23)
CO2: 23 mmol/L (ref 22–32)
Calcium: 9.1 mg/dL (ref 8.9–10.3)
Chloride: 106 mmol/L (ref 98–111)
Creatinine, Ser: 0.84 mg/dL (ref 0.44–1.00)
GFR, Estimated: >60 mL/min (ref >60)
Glucose, Bld: 130 mg/dL — ABNORMAL HIGH (ref 70–99)
Potassium: 3.5 mmol/L (ref 3.5–5.1)
Sodium: 137 mmol/L (ref 135–145)
Total Bilirubin: 6.9 mg/dL — ABNORMAL HIGH (ref <1.2)
Total Protein: 6.4 g/dL — ABNORMAL LOW (ref 6.5–8.1)

## 2023-06-29 LAB — HEPATITIS PANEL, ACUTE: Hepatitis B Surface Ag: NONREACTIVE

## 2023-06-29 LAB — CBC
HCT: 33.5 % — ABNORMAL LOW (ref 36.0–46.0)
Hemoglobin: 11.4 g/dL — ABNORMAL LOW (ref 12.0–15.0)
MCH: 27.3 pg (ref 26.0–34.0)
MCHC: 34 g/dL (ref 30.0–36.0)
MCV: 80.1 fL (ref 80.0–100.0)
Platelets: 250 10*3/uL (ref 150–400)
RBC: 4.18 MIL/uL (ref 3.87–5.11)
RDW: 14.8 % (ref 11.5–15.5)
WBC: 8.3 10*3/uL (ref 4.0–10.5)
nRBC: 0 % (ref 0.0–0.2)

## 2023-06-29 LAB — LIPASE, BLOOD: Lipase: 155 U/L — ABNORMAL HIGH (ref 11–51)

## 2023-06-29 MED ORDER — TRAMADOL HCL 50 MG PO TABS: 50.0000 mg | ORAL_TABLET | Freq: Three times a day (TID) | ORAL | 0 refills | Status: DC | PRN

## 2023-06-29 MED ORDER — GADOBUTROL 1 MMOL/ML IV SOLN
10.0000 mL/kg | Freq: Once | INTRAVENOUS | Status: AC | PRN
  Administered 2023-06-29: 1066 mL via INTRAVENOUS

## 2023-06-29 NOTE — Progress Notes (Signed)
PROGRESS NOTE    Tonya Bowers  WUJ:[id number] DOB: [birth year] DOA: 06/28/2023 PCP: Mliss Sax, MD    Brief Narrative:  Tonya Bowers is a 72 y.o. female with medical history significant for pancreatic cyst, prior cholecystectomy who is admitted for evaluation of suspected pancreatic neoplasm associated with transaminitis.    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  New mild bile duct and main pancreatic duct dilation noted as well. -GI to see -Possible ERCP -Keep n.p.o -Follow CA 19-9, CEA, AFP   Hypertension: Continue irbesartan.  IV hydralazine as needed.   Hyperlipidemia: Holding statin due to LFTs  Obesity Estimated body mass index is 39.11 kg/m as calculated from the following:   Height as of this encounter: 5\' 5"  (1.651 m).   Weight as of this encounter: 106.6 kg.    DVT prophylaxis: SCDs Start: 06/28/23 2331    Code Status: Full Code   Disposition Plan:  Level of care: Med-Surg Status is: Observation The patient will require care spanning > 2 midnights and should be moved to inpatient    Consultants:  GI   Subjective: Has remained NPO this AM  Objective: Vitals:   06/28/23 2100 06/28/23 2258 06/29/23 0611 06/29/23 0756  BP: (!) 187/60 (!) 190/71 (!) 173/67 (!) 173/73  Pulse: 69 68 64 70  Resp: 17 17 16    Temp:  98.4 F (36.9 C) 98.1 F (36.7 C)   TempSrc:  Oral Oral   SpO2: 100% 99% 100% 99%  Weight:      Height:       No intake or output data in the 24 hours ending 06/29/23 1047 Filed Weights   06/28/23 1443  Weight: 106.6 kg    Examination:   General: Appearance:    Obese female in no acute distress     Lungs:     respirations unlabored  Heart:    Normal heart rate.    MS:   All extremities are intact.    Neurologic:   Awake, alert       Data Reviewed: I have personally reviewed following labs and imaging  studies  CBC: Recent Labs  Lab 06/28/23 1445 06/29/23 0553  WBC 8.3 8.3  HGB 11.7* 11.4*  HCT 35.3* 33.5*  MCV 81.3 80.1  PLT 258 250   Basic Metabolic Panel: Recent Labs  Lab 06/28/23 1445 06/29/23 0553  NA 136 137  K 3.8 3.5  CL 101 106  CO2 27 23  GLUCOSE 119* 130*  BUN 13 9  CREATININE 0.81 0.84  CALCIUM 9.1 9.1   GFR: Estimated Creatinine Clearance: 74.5 mL/min (by C-G formula based on SCr of 0.84 mg/dL). Liver Function Tests: Recent Labs  Lab 06/28/23 1445 06/29/23 0553  AST 167* 143*  ALT 303* 254*  ALKPHOS 405* 386*  BILITOT 5.8* 6.9*  PROT 7.4 6.4*  ALBUMIN 3.6 3.0*   Recent Labs  Lab 06/28/23 1445  LIPASE 521*   No results for input(s): "AMMONIA" in the last 168 hours. Coagulation Profile: Recent Labs  Lab 06/28/23 1654  INR 1.0   Cardiac Enzymes: No results for input(s): "CKTOTAL", "CKMB", "CKMBINDEX", "TROPONINI" in the last 168 hours. BNP (last 3 results) No results for input(s): "PROBNP" in the last 8760 hours. HbA1C: No results for input(s): "HGBA1C" in the last 72 hours. CBG: No results for input(s): "GLUCAP" in the last 168 hours. Lipid Profile: No results for input(s): "CHOL", "  HDL", "LDLCALC", "TRIG", "CHOLHDL", "LDLDIRECT" in the last 72 hours. Thyroid Function Tests: No results for input(s): "TSH", "T4TOTAL", "FREET4", "T3FREE", "THYROIDAB" in the last 72 hours. Anemia Panel: No results for input(s): "VITAMINB12", "FOLATE", "FERRITIN", "TIBC", "IRON", "RETICCTPCT" in the last 72 hours. Sepsis Labs: No results for input(s): "PROCALCITON", "LATICACIDVEN" in the last 168 hours.  No results found for this or any previous visit (from the past 240 hours).       Radiology Studies: MR ABDOMEN MRCP W WO CONTAST Result Date: 06/29/2023 CLINICAL DATA:  Jaundice. Biliary ductal dilatation. Pancreatic mass suspected on recent CT. EXAM: MRI ABDOMEN WITHOUT AND WITH CONTRAST (INCLUDING MRCP) TECHNIQUE: Multiplanar multisequence MR  imaging of the abdomen was performed both before and after the administration of intravenous contrast. Heavily T2-weighted images of the biliary and pancreatic ducts were obtained, and three-dimensional MRCP images were rendered by post processing. CONTRAST:  GADAVIST GADOBUTROL 1 MMOL/ML IV SOLN COMPARISON:  CT on 06/28/2023, and MRI on 03/07/2023 FINDINGS: Lower chest: No acute findings. Hepatobiliary: No hepatic masses identified. Prior cholecystectomy. Diffuse intra and extrahepatic biliary ductal dilatation is new since previous study, with stricture of the intrapancreatic portion of the distal common bile duct. Pancreas: Mild diffuse pancreatic ductal dilatation is new since previous study. A few tiny cysts are again seen in the pancreatic body, without significant change. A solid enhancing mass is now seen in the pancreatic uncinate process, causing stricture of the common bile duct and pancreatic duct. This measures 3.3 x 2.8 cm, and shows mild hyperenhancement relative to normal pancreatic parenchyma. Differential diagnosis includes pancreatic carcinoma and neuroendocrine tumor. Spleen:  Within normal limits in size and appearance. Adrenals/Urinary Tract: Mild lymphadenopathy is seen along the dorsal aspect of the pancreatic head and body measuring up to 1.5 cm (e.g. Image 21/11), highly suspicious for metastatic disease. No other sites of lymphadenopathy identified. No evidence of hydronephrosis. Stomach/Bowel: Left colonic diverticulosis noted, without signs of diverticulitis in this area. Vascular/Lymphatic: No pathologically enlarged lymph nodes identified. No acute vascular findings. The pancreatic uncinate process mass shows no evidence of vascular involvement of the celiac or superior mesenteric vessels, or portal vein. Other:  None. Musculoskeletal:  No suspicious bone lesions identified. IMPRESSION: 3.3 cm solid mass in the pancreatic uncinate process with mild hyperenhancement, causing  diffuse biliary and pancreatic ductal dilatation. Differential diagnosis includes pancreatic carcinoma and neuroendocrine tumor. Consider EUS/FNA. Mild lymphadenopathy along the dorsal aspect of the pancreatic head and body, highly suspicious for metastatic disease. No evidence of vascular involvement or other sites of metastatic disease. Electronically Signed   By: Danae Orleans M.D.   On: 06/29/2023 10:36   MR 3D Recon At Scanner Result Date: 06/29/2023 CLINICAL DATA:  Jaundice. Biliary ductal dilatation. Pancreatic mass suspected on recent CT. EXAM: MRI ABDOMEN WITHOUT AND WITH CONTRAST (INCLUDING MRCP) TECHNIQUE: Multiplanar multisequence MR imaging of the abdomen was performed both before and after the administration of intravenous contrast. Heavily T2-weighted images of the biliary and pancreatic ducts were obtained, and three-dimensional MRCP images were rendered by post processing. CONTRAST:  GADAVIST GADOBUTROL 1 MMOL/ML IV SOLN COMPARISON:  CT on 06/28/2023, and MRI on 03/07/2023 FINDINGS: Lower chest: No acute findings. Hepatobiliary: No hepatic masses identified. Prior cholecystectomy. Diffuse intra and extrahepatic biliary ductal dilatation is new since previous study, with stricture of the intrapancreatic portion of the distal common bile duct. Pancreas: Mild diffuse pancreatic ductal dilatation is new since previous study. A few tiny cysts are again seen in the pancreatic body, without  significant change. A solid enhancing mass is now seen in the pancreatic uncinate process, causing stricture of the common bile duct and pancreatic duct. This measures 3.3 x 2.8 cm, and shows mild hyperenhancement relative to normal pancreatic parenchyma. Differential diagnosis includes pancreatic carcinoma and neuroendocrine tumor. Spleen:  Within normal limits in size and appearance. Adrenals/Urinary Tract: Mild lymphadenopathy is seen along the dorsal aspect of the pancreatic head and body measuring up to  1.5 cm (e.g. Image 21/11), highly suspicious for metastatic disease. No other sites of lymphadenopathy identified. No evidence of hydronephrosis. Stomach/Bowel: Left colonic diverticulosis noted, without signs of diverticulitis in this area. Vascular/Lymphatic: No pathologically enlarged lymph nodes identified. No acute vascular findings. The pancreatic uncinate process mass shows no evidence of vascular involvement of the celiac or superior mesenteric vessels, or portal vein. Other:  None. Musculoskeletal:  No suspicious bone lesions identified. IMPRESSION: 3.3 cm solid mass in the pancreatic uncinate process with mild hyperenhancement, causing diffuse biliary and pancreatic ductal dilatation. Differential diagnosis includes pancreatic carcinoma and neuroendocrine tumor. Consider EUS/FNA. Mild lymphadenopathy along the dorsal aspect of the pancreatic head and body, highly suspicious for metastatic disease. No evidence of vascular involvement or other sites of metastatic disease. Electronically Signed   By: Danae Orleans M.D.   On: 06/29/2023 10:36   CT ABDOMEN PELVIS W CONTRAST Result Date: 06/28/2023 CLINICAL DATA:  One-week history of left-sided abdominal pain and discolored urine EXAM: CT ABDOMEN AND PELVIS WITH CONTRAST TECHNIQUE: Multidetector CT imaging of the abdomen and pelvis was performed using the standard protocol following bolus administration of intravenous contrast. RADIATION DOSE REDUCTION: This exam was performed according to the departmental dose-optimization program which includes automated exposure control, adjustment of the mA and/or kV according to patient size and/or use of iterative reconstruction technique. CONTRAST:  OMNIPAQUE IOHEXOL 300 MG/ML  SOLN COMPARISON:  MR abdomen dated 03/07/2023, CT abdomen dated 09/10/2011 FINDINGS: Lower chest: 5 mm subpleural right lower lobe nodule (302:5), unchanged from 2013, likely benign. No specific follow-up imaging recommended. No pleural  effusion or pneumothorax demonstrated. Partially imaged heart size is normal. Hepatobiliary: No focal hepatic lesions. Increased prominence of mild intrahepatic bile duct dilation. Common bile duct measures 10 mm, previously 5 mm. Cholecystectomy. Pancreas: Subtle, ill-defined hypodensity in the pancreatic uncinate process measures 2.6 x 2.6 cm (301:35). Main pancreatic duct measures 4 mm, previously 2 mm. Spleen: Normal in size without focal abnormality. Adrenals/Urinary Tract: No adrenal nodules. No suspicious renal mass, calculi or hydronephrosis. No focal bladder wall thickening. Stomach/Bowel: Normal appearance of the stomach. No evidence of bowel wall thickening, distention, or inflammatory changes. Colonic diverticulosis without acute diverticulitis. Appendix is not discretely seen. Vascular/Lymphatic: Aortic atherosclerosis. No enlarged abdominal or pelvic lymph nodes. Reproductive: No adnexal masses. Other: No free fluid, fluid collection, or free air. Musculoskeletal: No acute or abnormal lytic or blastic osseous lesions. IMPRESSION: 1. Subtle, ill-defined hypodensity in the pancreatic uncinate process measures 2.6 x 2.6 cm, suspicious for pancreatic neoplasm. Focal pancreatitis is considered much less likely. 2. New mild intrahepatic bile duct dilation, common bile duct dilation, and main pancreatic duct dilation compared to 03/07/2023. 3. Colonic diverticulosis without acute diverticulitis. 4.  Aortic Atherosclerosis (ICD10-I70.0). Electronically Signed   By: Agustin Cree M.D.   On: 06/28/2023 19:02        Scheduled Meds:  irbesartan  150 mg Oral Daily   Continuous Infusions:   LOS: 0 days    Time spent: 45 minutes spent on chart review, discussion with nursing staff, consultants,  updating family and interview/physical exam; more than 50% of that time was spent in counseling and/or coordination of care.    Joseph Art, DO [facility] Available via Epic secure chat  7am-7pm After these hours, please refer to coverage provider listed on [email or web address] 06/29/2023, 10:47 AM

## 2023-06-29 NOTE — Plan of Care (Signed)

## 2023-06-29 NOTE — Consult Note (Signed)
Consultation  Referring Provider:   [facility] Primary Care Physician:  Mliss Sax, MD Primary Gastroenterologist:  Dr. Lavon Paganini for screening colonoscopy (remotely Dr. Juanda Chance) Reason for Consultation: Biliary obstruction, CT/MRCP concern for pancreatic mass    DOA: 06/28/2023         Hospital Day: 2         HPI:   Tonya Bowers is a 72 y.o. female with past medical history significant for cholecystectomy 2002, hysterectomy 1997, history of diverticulosis, GERD, hypertension, pancreatic cysts.   09/2011 EGD with Dr. Juanda Chance for abdominal discomfort showed mild gastritis otherwise unremarkable.  Pathology showed chronic inactive gastritis negative H. pylori no dysplasia 10/30/2016 colonoscopy Dr. 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  Patient presented to ER for evaluation of 1 week of dark urine, left-sided flank pain, clay colored stools. HD stable. AST 167, ALT 303, alk phos 4 5, total bilirubin 5.8 repeat this morning showed AST 143, ALT 254, alkaline phosphatase 386, total bilirubin 6.9 Albumin 3.6-3, normal kidney function Urine with moderate bilirubin no infection. No leukocytosis, Hgb 11.7, MCV 81 Normal platelets Lipase 521, pending repeat lipase INR normal at 1 Negative acute hepatitis panel, acetaminophen negative CT abdomen pelvis with contrast showed subtle ill-defined hypodensity in the pancreatic uncinate process measuring 2.6 x 2.6 cm suspicious for  pancreatic neoplasm.  Focal pancreatitis is considered much less likely.  New mild intrahepatic bile duct dilation, CBD dilation, main pancreatic duct dilation compared to 9/01.  Diverticulosis without diverticulitis.  Aortic atherosclerosis MRCP with and without contrast showed 3.3 cm solid mass in the pancreatic uncinate process with mild hyperenhancement, causing diffuse biliary and pancreatic ductal dilation.  Differential diagnosis includes pancreatic carcinoma and neuroendocrine tumor.  Mild lymphadenopathy along the dorsal aspect of the pancreatic head and body highly suspicious for metastatic disease.  No evidence of vascular involvement or other sites of metastatic disease. Pending CEA, CA 19-9 and AFP  Has been Fredrik Cove is in the room with her, patient states she is feeling her normal state of health.  In the past month she has had some left flank pain worse with eating, minor decreased appetite no significant weight loss.  She has had some worsening constipation and has been on MiraLAX daily.   Within the last week she started noticing at first, dark urine with clay/tan-colored stools then started to notice yellowing of her eyes. During this time she denies leg swelling, abdominal swelling, fever or chills. Denies family history of pancreatic cancer. Denies tobacco use, alcohol history or drug use.   Abnormal ED labs: Abnormal Labs Reviewed  LIPASE, BLOOD - Abnormal; Notable for the following components:      Result Value   Lipase 521 (*)    All other components within normal limits  COMPREHENSIVE METABOLIC PANEL - Abnormal; Notable for the following components:   Glucose, Bld 119 (*)    AST 167 (*)    ALT 303 (*)    Alkaline Phosphatase 405 (*)    Total Bilirubin 5.8 (*)    All other components within  normal limits  CBC - Abnormal; Notable for the following components:   Hemoglobin 11.7 (*)    HCT 35.3 (*)    All other components within normal limits  URINALYSIS, ROUTINE W REFLEX  MICROSCOPIC - Abnormal; Notable for the following components:   Bilirubin Urine MODERATE (*)    All other components within normal limits  ACETAMINOPHEN LEVEL - Abnormal; Notable for the following components:   Acetaminophen (Tylenol), Serum <10 (*)    All other components within normal limits  CBC - Abnormal; Notable for the following components:   Hemoglobin 11.4 (*)    HCT 33.5 (*)    All other components within normal limits  COMPREHENSIVE METABOLIC PANEL - Abnormal; Notable for the following components:   Glucose, Bld 130 (*)    Total Protein 6.4 (*)    Albumin 3.0 (*)    AST 143 (*)    ALT 254 (*)    Alkaline Phosphatase 386 (*)    Total Bilirubin 6.9 (*)    All other components within normal limits    Past Medical History:  Diagnosis Date   Arthritis    Cataract    Diverticulosis    GERD (gastroesophageal reflux disease)    Hyperplastic colon polyp 09/09/06    Surgical History:  She  has a past surgical history that includes Abdominal hysterectomy; Cholecystectomy; Tubal ligation; Colonoscopy; and Polypectomy. Family History:  Her family history includes Hypertension in her brother, father, and mother; Kidney disease in her brother. Social History:   reports that she has never smoked. She has never used smokeless tobacco. She reports current alcohol use. She reports that she does not use drugs.  Prior to Admission medications   Medication Sig Start Date End Date Taking? Authorizing Provider  calcium carbonate (OSCAL) 1500 (600 Ca) MG TABS tablet Take 600 mg of elemental calcium by mouth 2 (two) times daily with a meal.   Yes [provider]  cholecalciferol (VITAMIN D3) 25 MCG (1000 UNIT) tablet Take 1,000 Units by mouth daily.   Yes [provider]  estradiol (ESTRACE) 0.5 MG tablet Take 0.5 mg by mouth daily. 03/24/18  Yes [provider]  irbesartan (AVAPRO) 150 MG tablet TAKE 1 TABLET BY MOUTH DAILY 06/21/23  Yes Mliss Sax, MD   Multiple Vitamins-Minerals (MULTIVITAMIN WITH MINERALS) tablet Take 1 tablet by mouth daily.   Yes [provider]  polyethylene glycol powder (MIRALAX) 17 GM/SCOOP powder 1 scoop dissolved in fluid of choice daily until bowel movement. 03/04/23  Yes Mliss Sax, MD  pravastatin (PRAVACHOL) 20 MG tablet TAKE 1 TABLET BY MOUTH AT  BEDTIME 08/24/22  Yes Mliss Sax, MD    Current Facility-Administered Medications  Medication Dose Route Frequency Provider Last Rate Last Admin   hydrALAZINE (APRESOLINE) injection 10 mg  10 mg Intravenous Q4H PRN Darreld Mclean R, MD   10 mg at 06/28/23 2358   irbesartan (AVAPRO) tablet 150 mg  150 mg Oral Daily Darreld Mclean R, MD   150 mg at 06/29/23 1019   ondansetron (ZOFRAN) tablet 4 mg  4 mg Oral Q6H PRN Charlsie Quest, MD       Or   ondansetron (ZOFRAN) injection 4 mg  4 mg Intravenous Q6H PRN Charlsie Quest, MD       oxyCODONE (Oxy IR/ROXICODONE) immediate release tablet 5 mg  5 mg Oral Q4H PRN Charlsie Quest, MD       senna-docusate (Senokot-S) tablet 1 tablet  1 tablet Oral  QHS PRN Charlsie Quest, MD       traMADol Janean Sark) tablet 50 mg  50 mg Oral Q8H PRN Charlsie Quest, MD        Allergies as of 06/28/2023 - Review Complete 06/28/2023  Allergen Reaction Noted   Aldactone [spironolactone] Other (See Comments) 05/01/2022   Amlodipine Other (See Comments) 05/01/2022   Lisinopril Cough 10/30/2015   Penicillins  12/22/2010   Thiazide-type diuretics Other (See Comments) 05/01/2022    Review of Systems:    Constitutional: No weight loss, fever, chills, weakness or fatigue HEENT: Eyes: No change in vision               Ears, Nose, Throat:  No change in hearing or congestion Skin: No rash or itching Cardiovascular: No chest pain, chest pressure or palpitations   Respiratory: No SOB or cough Gastrointestinal: See HPI and otherwise negative Genitourinary: No dysuria or change in urinary frequency Neurological: No  headache, dizziness or syncope Musculoskeletal: No new muscle or joint pain Hematologic: No bleeding or bruising Psychiatric: No history of depression or anxiety     Physical Exam:  Vital signs in last 24 hours: Temp:  [98 F (36.7 C)-98.5 F (36.9 C)] 98.5 F (36.9 C) (12/24 1212) Pulse Rate:  [63-80] 68 (12/24 1212) Resp:  [16-18] 16 (12/24 0611) BP: (165-196)/(60-83) 165/66 (12/24 1212) SpO2:  [99 %-100 %] 100 % (12/24 1212) Weight:  [106.6 kg] 106.6 kg (12/23 1443) Last BM Date : 06/28/23 Last BM recorded by nurses in past 5 days No data recorded  General:   Jaundiced, pleasant female in no acute distress Head:  Normocephalic and atraumatic. Eyes: scleral icterus,conjunctive pink  Heart:  regular rate and rhythm, no murmurs or gallops Pulm: Clear anteriorly; no wheezing Abdomen:  Soft, Obese AB, Active bowel sounds. No tendernes. Without guarding and Without rebound, No organomegaly appreciated. Extremities:  Without edema. Msk:  Symmetrical without gross deformities. Peripheral pulses intact.  Neurologic:  Alert and  oriented x4;  No focal deficits.  Skin:   Dry and intact without significant lesions or rashes. Psychiatric:  Cooperative. Normal mood and affect.  LAB RESULTS: Recent Labs    06/28/23 1445 06/29/23 0553  WBC 8.3 8.3  HGB 11.7* 11.4*  HCT 35.3* 33.5*  PLT 258 250   BMET Recent Labs    06/28/23 1445 06/29/23 0553  NA 136 137  K 3.8 3.5  CL 101 106  CO2 27 23  GLUCOSE 119* 130*  BUN 13 9  CREATININE 0.81 0.84  CALCIUM 9.1 9.1   LFT Recent Labs    06/29/23 0553  PROT 6.4*  ALBUMIN 3.0*  AST 143*  ALT 254*  ALKPHOS 386*  BILITOT 6.9*   PT/INR Recent Labs    06/28/23 1654  LABPROT 13.0  INR 1.0    STUDIES: MR ABDOMEN MRCP W WO CONTAST Result Date: 06/29/2023 CLINICAL DATA:  Jaundice. Biliary ductal dilatation. Pancreatic mass suspected on recent CT. EXAM: MRI ABDOMEN WITHOUT AND WITH CONTRAST (INCLUDING MRCP) TECHNIQUE:  Multiplanar multisequence MR imaging of the abdomen was performed both before and after the administration of intravenous contrast. Heavily T2-weighted images of the biliary and pancreatic ducts were obtained, and three-dimensional MRCP images were rendered by post processing. CONTRAST:  GADAVIST GADOBUTROL 1 MMOL/ML IV SOLN COMPARISON:  CT on 06/28/2023, and MRI on 03/07/2023 FINDINGS: Lower chest: No acute findings. Hepatobiliary: No hepatic masses identified. Prior cholecystectomy. Diffuse intra and extrahepatic biliary ductal dilatation is new since previous study, with  stricture of the intrapancreatic portion of the distal common bile duct. Pancreas: Mild diffuse pancreatic ductal dilatation is new since previous study. A few tiny cysts are again seen in the pancreatic body, without significant change. A solid enhancing mass is now seen in the pancreatic uncinate process, causing stricture of the common bile duct and pancreatic duct. This measures 3.3 x 2.8 cm, and shows mild hyperenhancement relative to normal pancreatic parenchyma. Differential diagnosis includes pancreatic carcinoma and neuroendocrine tumor. Spleen:  Within normal limits in size and appearance. Adrenals/Urinary Tract: Mild lymphadenopathy is seen along the dorsal aspect of the pancreatic head and body measuring up to 1.5 cm (e.g. Image 21/11), highly suspicious for metastatic disease. No other sites of lymphadenopathy identified. No evidence of hydronephrosis. Stomach/Bowel: Left colonic diverticulosis noted, without signs of diverticulitis in this area. Vascular/Lymphatic: No pathologically enlarged lymph nodes identified. No acute vascular findings. The pancreatic uncinate process mass shows no evidence of vascular involvement of the celiac or superior mesenteric vessels, or portal vein. Other:  None. Musculoskeletal:  No suspicious bone lesions identified. IMPRESSION: 3.3 cm solid mass in the pancreatic uncinate process with mild  hyperenhancement, causing diffuse biliary and pancreatic ductal dilatation. Differential diagnosis includes pancreatic carcinoma and neuroendocrine tumor. Consider EUS/FNA. Mild lymphadenopathy along the dorsal aspect of the pancreatic head and body, highly suspicious for metastatic disease. No evidence of vascular involvement or other sites of metastatic disease. Electronically Signed   By: Danae Orleans M.D.   On: 06/29/2023 10:36   MR 3D Recon At Scanner Result Date: 06/29/2023 CLINICAL DATA:  Jaundice. Biliary ductal dilatation. Pancreatic mass suspected on recent CT. EXAM: MRI ABDOMEN WITHOUT AND WITH CONTRAST (INCLUDING MRCP) TECHNIQUE: Multiplanar multisequence MR imaging of the abdomen was performed both before and after the administration of intravenous contrast. Heavily T2-weighted images of the biliary and pancreatic ducts were obtained, and three-dimensional MRCP images were rendered by post processing. CONTRAST:  GADAVIST GADOBUTROL 1 MMOL/ML IV SOLN COMPARISON:  CT on 06/28/2023, and MRI on 03/07/2023 FINDINGS: Lower chest: No acute findings. Hepatobiliary: No hepatic masses identified. Prior cholecystectomy. Diffuse intra and extrahepatic biliary ductal dilatation is new since previous study, with stricture of the intrapancreatic portion of the distal common bile duct. Pancreas: Mild diffuse pancreatic ductal dilatation is new since previous study. A few tiny cysts are again seen in the pancreatic body, without significant change. A solid enhancing mass is now seen in the pancreatic uncinate process, causing stricture of the common bile duct and pancreatic duct. This measures 3.3 x 2.8 cm, and shows mild hyperenhancement relative to normal pancreatic parenchyma. Differential diagnosis includes pancreatic carcinoma and neuroendocrine tumor. Spleen:  Within normal limits in size and appearance. Adrenals/Urinary Tract: Mild lymphadenopathy is seen along the dorsal aspect of the pancreatic head  and body measuring up to 1.5 cm (e.g. Image 21/11), highly suspicious for metastatic disease. No other sites of lymphadenopathy identified. No evidence of hydronephrosis. Stomach/Bowel: Left colonic diverticulosis noted, without signs of diverticulitis in this area. Vascular/Lymphatic: No pathologically enlarged lymph nodes identified. No acute vascular findings. The pancreatic uncinate process mass shows no evidence of vascular involvement of the celiac or superior mesenteric vessels, or portal vein. Other:  None. Musculoskeletal:  No suspicious bone lesions identified. IMPRESSION: 3.3 cm solid mass in the pancreatic uncinate process with mild hyperenhancement, causing diffuse biliary and pancreatic ductal dilatation. Differential diagnosis includes pancreatic carcinoma and neuroendocrine tumor. Consider EUS/FNA. Mild lymphadenopathy along the dorsal aspect of the pancreatic head and body, highly suspicious for  metastatic disease. No evidence of vascular involvement or other sites of metastatic disease. Electronically Signed   By: Danae Orleans M.D.   On: 06/29/2023 10:36   CT ABDOMEN PELVIS W CONTRAST Result Date: 06/28/2023 CLINICAL DATA:  One-week history of left-sided abdominal pain and discolored urine EXAM: CT ABDOMEN AND PELVIS WITH CONTRAST TECHNIQUE: Multidetector CT imaging of the abdomen and pelvis was performed using the standard protocol following bolus administration of intravenous contrast. RADIATION DOSE REDUCTION: This exam was performed according to the departmental dose-optimization program which includes automated exposure control, adjustment of the mA and/or kV according to patient size and/or use of iterative reconstruction technique. CONTRAST:  OMNIPAQUE IOHEXOL 300 MG/ML  SOLN COMPARISON:  MR abdomen dated 03/07/2023, CT abdomen dated 09/10/2011 FINDINGS: Lower chest: 5 mm subpleural right lower lobe nodule (302:5), unchanged from 2013, likely benign. No specific follow-up imaging  recommended. No pleural effusion or pneumothorax demonstrated. Partially imaged heart size is normal. Hepatobiliary: No focal hepatic lesions. Increased prominence of mild intrahepatic bile duct dilation. Common bile duct measures 10 mm, previously 5 mm. Cholecystectomy. Pancreas: Subtle, ill-defined hypodensity in the pancreatic uncinate process measures 2.6 x 2.6 cm (301:35). Main pancreatic duct measures 4 mm, previously 2 mm. Spleen: Normal in size without focal abnormality. Adrenals/Urinary Tract: No adrenal nodules. No suspicious renal mass, calculi or hydronephrosis. No focal bladder wall thickening. Stomach/Bowel: Normal appearance of the stomach. No evidence of bowel wall thickening, distention, or inflammatory changes. Colonic diverticulosis without acute diverticulitis. Appendix is not discretely seen. Vascular/Lymphatic: Aortic atherosclerosis. No enlarged abdominal or pelvic lymph nodes. Reproductive: No adnexal masses. Other: No free fluid, fluid collection, or free air. Musculoskeletal: No acute or abnormal lytic or blastic osseous lesions. IMPRESSION: 1. Subtle, ill-defined hypodensity in the pancreatic uncinate process measures 2.6 x 2.6 cm, suspicious for pancreatic neoplasm. Focal pancreatitis is considered much less likely. 2. New mild intrahepatic bile duct dilation, common bile duct dilation, and main pancreatic duct dilation compared to 03/07/2023. 3. Colonic diverticulosis without acute diverticulitis. 4.  Aortic Atherosclerosis (ICD10-I70.0). Electronically Signed   By: Agustin Cree M.D.   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 In the ER found to have elevated  liver function with signs of obstruction, AST 143, ALT 254, alk phos 386, total bilirubin 6.9 Hepatitis panel negative, INR normal, no leukocytosis Lipase initially elevated at 521, pending repeat lipase but currently patient has no abdominal pain on palpation, less likely acute pancreatitis MRCP shows 3.3 cm solid mass with diffuse biliary and pancreatic ductal dilation differential includes pancreatic carcinoma neuroendocrine tumor, mild lymphadenopathy seen along dorsal aspect of pancreatic head no vascular involvement no other signs of metastasis. Findings very concerning for pancreatic adenocarcinoma versus possible neuroendocrine tumor with biliary obstruction -Follow up CEA, CA 19-9 and AFP -Follow-up lipase -Continue supportive care -trend LFTs, CBC -No availability today for ERCP, limited anesthesia availability Christmas Day, plan for ERCP with Dr. Russella Dar Thursday for potential stent and cytology brushings. -Can advance diet in the meantime soft diet, NPO Thursday midnight -I thoroughly discussed procedure with the patient to include nature, alternatives, benefits, and risks (including but not limited to post ERCP pancreatitis, bleeding, infection, perforation, anesthesia/cardiac pulmonary complications).  Patient verbalized understanding and gave verbal consent to proceed with ERCP. -Patient will still need EUS with FNA, will have to discuss with our biliary specialist timing of availability, may need to consider tertiary referral pending on timing.   Principal Problem:   Pancreatic neoplasm Active Problems:   Essential hypertension   Transaminitis   Hyperlipidemia    LOS: 0 days    Thank you for your kind consultation, we will continue to follow.   Doree Albee  06/29/2023, 12:40 PM

## 2023-06-29 NOTE — Discharge Summary (Signed)
Physician Discharge Summary  Tonya Bowers OZH:[id number] DOB: 1951/06/23 DOA: 06/28/2023  PCP: Mliss Sax, MD  Admit date: 06/28/2023 Discharge date: 06/29/2023  Admitted From: home Discharge disposition: home   Recommendations for Outpatient Follow-Up:   Needs EUS and biopsy--- would prefer to go to [facility]   Discharge Diagnosis:   Principal Problem:   Pancreatic mass Active Problems:   Essential hypertension   Transaminitis   Hyperlipidemia   Hyperbilirubinemia    Discharge Condition: Improved.  Diet recommendation: soft  Wound care: None.  Code status: Full.   History of Present Illness:   Tonya Bowers is a 72 y.o. female with medical history significant for pancreatic cysts, prior cholecystectomy, HTN who presented to the ED for evaluation of 1 week of orange urine, left side flank pain, clay colored stools.   Patient states that 1 week ago she began to notice orange-colored urine, clay colored stools, and more recently has been having some left-sided flank pain.  She was has not had any abdominal pain, nausea, vomiting, fevers, chills, diaphoresis, change in weight, chest pain, dyspnea.   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  No pancreatic ductal dilation or evidence of acute inflammation were seen at that time.     Hospital Course by Problem:   Suspected pancreatic neoplasm Transaminitis: 2.6 x 2.6 cm hypodensity in the pancreatic uncinate process noted on CT imaging.  New mild bile duct and main pancreatic duct dilation noted as well. -GI to see-- needs EUS/biopsy-- patient prefers [facility]   Hypertension: Continue irbesartan.     Hyperlipidemia: Holding statin due to LFTs -resume once labs improved   Obesity Estimated body mass index is 39.11 kg/m as calculated from the following:   Height as of this encounter: 5\' 5"  (1.651 m).   Weight as of  this encounter: 106.6 kg.     Medical Consultants:   GI   Discharge Exam:   Vitals:   06/29/23 0756 06/29/23 1212  BP: (!) 173/73 (!) 165/66  Pulse: 70 68  Resp:    Temp:  98.5 F (36.9 C)  SpO2: 99% 100%   Vitals:   06/28/23 2258 06/29/23 0611 06/29/23 0756 06/29/23 1212  BP: (!) 190/71 (!) 173/67 (!) 173/73 (!) 165/66  Pulse: 68 64 70 68  Resp: 17 16    Temp: 98.4 F (36.9 C) 98.1 F (36.7 C)  98.5 F (36.9 C)  TempSrc: Oral Oral    SpO2: 99% 100% 99% 100%  Weight:      Height:        General exam: Appears calm and comfortable.   The results of significant diagnostics from this hospitalization (including imaging, microbiology, ancillary and laboratory) are listed below for reference.     Procedures and Diagnostic Studies:   MR ABDOMEN MRCP W WO CONTAST Result Date: 06/29/2023 CLINICAL DATA:  Jaundice. Biliary ductal dilatation. Pancreatic mass suspected on recent CT. EXAM: MRI ABDOMEN WITHOUT AND WITH CONTRAST (INCLUDING MRCP) TECHNIQUE: Multiplanar multisequence MR imaging of the abdomen was performed both before and after the administration of intravenous contrast. Heavily T2-weighted images of the biliary and pancreatic ducts were obtained, and three-dimensional MRCP images were rendered by post processing. CONTRAST:  GADAVIST GADOBUTROL 1 MMOL/ML IV SOLN COMPARISON:  CT on 06/28/2023, and MRI on 03/07/2023 FINDINGS: Lower chest: No acute findings. Hepatobiliary: No hepatic masses identified. Prior cholecystectomy. Diffuse intra and extrahepatic biliary ductal dilatation  is new since previous study, with stricture of the intrapancreatic portion of the distal common bile duct. Pancreas: Mild diffuse pancreatic ductal dilatation is new since previous study. A few tiny cysts are again seen in the pancreatic body, without significant change. A solid enhancing mass is now seen in the pancreatic uncinate process, causing stricture of the common bile duct and  pancreatic duct. This measures 3.3 x 2.8 cm, and shows mild hyperenhancement relative to normal pancreatic parenchyma. Differential diagnosis includes pancreatic carcinoma and neuroendocrine tumor. Spleen:  Within normal limits in size and appearance. Adrenals/Urinary Tract: Mild lymphadenopathy is seen along the dorsal aspect of the pancreatic head and body measuring up to 1.5 cm (e.g. Image 21/11), highly suspicious for metastatic disease. No other sites of lymphadenopathy identified. No evidence of hydronephrosis. Stomach/Bowel: Left colonic diverticulosis noted, without signs of diverticulitis in this area. Vascular/Lymphatic: No pathologically enlarged lymph nodes identified. No acute vascular findings. The pancreatic uncinate process mass shows no evidence of vascular involvement of the celiac or superior mesenteric vessels, or portal vein. Other:  None. Musculoskeletal:  No suspicious bone lesions identified. IMPRESSION: 3.3 cm solid mass in the pancreatic uncinate process with mild hyperenhancement, causing diffuse biliary and pancreatic ductal dilatation. Differential diagnosis includes pancreatic carcinoma and neuroendocrine tumor. Consider EUS/FNA. Mild lymphadenopathy along the dorsal aspect of the pancreatic head and body, highly suspicious for metastatic disease. No evidence of vascular involvement or other sites of metastatic disease. Electronically Signed   By: Danae Orleans M.D.   On: 06/29/2023 10:36   MR 3D Recon At Scanner Result Date: 06/29/2023 CLINICAL DATA:  Jaundice. Biliary ductal dilatation. Pancreatic mass suspected on recent CT. EXAM: MRI ABDOMEN WITHOUT AND WITH CONTRAST (INCLUDING MRCP) TECHNIQUE: Multiplanar multisequence MR imaging of the abdomen was performed both before and after the administration of intravenous contrast. Heavily T2-weighted images of the biliary and pancreatic ducts were obtained, and three-dimensional MRCP images were rendered by post processing. CONTRAST:   GADAVIST GADOBUTROL 1 MMOL/ML IV SOLN COMPARISON:  CT on 06/28/2023, and MRI on 03/07/2023 FINDINGS: Lower chest: No acute findings. Hepatobiliary: No hepatic masses identified. Prior cholecystectomy. Diffuse intra and extrahepatic biliary ductal dilatation is new since previous study, with stricture of the intrapancreatic portion of the distal common bile duct. Pancreas: Mild diffuse pancreatic ductal dilatation is new since previous study. A few tiny cysts are again seen in the pancreatic body, without significant change. A solid enhancing mass is now seen in the pancreatic uncinate process, causing stricture of the common bile duct and pancreatic duct. This measures 3.3 x 2.8 cm, and shows mild hyperenhancement relative to normal pancreatic parenchyma. Differential diagnosis includes pancreatic carcinoma and neuroendocrine tumor. Spleen:  Within normal limits in size and appearance. Adrenals/Urinary Tract: Mild lymphadenopathy is seen along the dorsal aspect of the pancreatic head and body measuring up to 1.5 cm (e.g. Image 21/11), highly suspicious for metastatic disease. No other sites of lymphadenopathy identified. No evidence of hydronephrosis. Stomach/Bowel: Left colonic diverticulosis noted, without signs of diverticulitis in this area. Vascular/Lymphatic: No pathologically enlarged lymph nodes identified. No acute vascular findings. The pancreatic uncinate process mass shows no evidence of vascular involvement of the celiac or superior mesenteric vessels, or portal vein. Other:  None. Musculoskeletal:  No suspicious bone lesions identified. IMPRESSION: 3.3 cm solid mass in the pancreatic uncinate process with mild hyperenhancement, causing diffuse biliary and pancreatic ductal dilatation. Differential diagnosis includes pancreatic carcinoma and neuroendocrine tumor. Consider EUS/FNA. Mild lymphadenopathy along the dorsal aspect of the pancreatic  head and body, highly suspicious for metastatic  disease. No evidence of vascular involvement or other sites of metastatic disease. Electronically Signed   By: Danae Orleans M.D.   On: 06/29/2023 10:36   CT ABDOMEN PELVIS W CONTRAST Result Date: 06/28/2023 CLINICAL DATA:  One-week history of left-sided abdominal pain and discolored urine EXAM: CT ABDOMEN AND PELVIS WITH CONTRAST TECHNIQUE: Multidetector CT imaging of the abdomen and pelvis was performed using the standard protocol following bolus administration of intravenous contrast. RADIATION DOSE REDUCTION: This exam was performed according to the departmental dose-optimization program which includes automated exposure control, adjustment of the mA and/or kV according to patient size and/or use of iterative reconstruction technique. CONTRAST:  OMNIPAQUE IOHEXOL 300 MG/ML  SOLN COMPARISON:  MR abdomen dated 03/07/2023, CT abdomen dated 09/10/2011 FINDINGS: Lower chest: 5 mm subpleural right lower lobe nodule (302:5), unchanged from 2013, likely benign. No specific follow-up imaging recommended. No pleural effusion or pneumothorax demonstrated. Partially imaged heart size is normal. Hepatobiliary: No focal hepatic lesions. Increased prominence of mild intrahepatic bile duct dilation. Common bile duct measures 10 mm, previously 5 mm. Cholecystectomy. Pancreas: Subtle, ill-defined hypodensity in the pancreatic uncinate process measures 2.6 x 2.6 cm (301:35). Main pancreatic duct measures 4 mm, previously 2 mm. Spleen: Normal in size without focal abnormality. Adrenals/Urinary Tract: No adrenal nodules. No suspicious renal mass, calculi or hydronephrosis. No focal bladder wall thickening. Stomach/Bowel: Normal appearance of the stomach. No evidence of bowel wall thickening, distention, or inflammatory changes. Colonic diverticulosis without acute diverticulitis. Appendix is not discretely seen. Vascular/Lymphatic: Aortic atherosclerosis. No enlarged abdominal or pelvic lymph nodes. Reproductive: No adnexal  masses. Other: No free fluid, fluid collection, or free air. Musculoskeletal: No acute or abnormal lytic or blastic osseous lesions. IMPRESSION: 1. Subtle, ill-defined hypodensity in the pancreatic uncinate process measures 2.6 x 2.6 cm, suspicious for pancreatic neoplasm. Focal pancreatitis is considered much less likely. 2. New mild intrahepatic bile duct dilation, common bile duct dilation, and main pancreatic duct dilation compared to 03/07/2023. 3. Colonic diverticulosis without acute diverticulitis. 4.  Aortic Atherosclerosis (ICD10-I70.0). Electronically Signed   By: Agustin Cree M.D.   On: 06/28/2023 19:02     Labs:   Basic Metabolic Panel: Recent Labs  Lab 06/28/23 1445 06/29/23 0553  NA 136 137  K 3.8 3.5  CL 101 106  CO2 27 23  GLUCOSE 119* 130*  BUN 13 9  CREATININE 0.81 0.84  CALCIUM 9.1 9.1   GFR Estimated Creatinine Clearance: 74.5 mL/min (by C-G formula based on SCr of 0.84 mg/dL). Liver Function Tests: Recent Labs  Lab 06/28/23 1445 06/29/23 0553  AST 167* 143*  ALT 303* 254*  ALKPHOS 405* 386*  BILITOT 5.8* 6.9*  PROT 7.4 6.4*  ALBUMIN 3.6 3.0*   Recent Labs  Lab 06/28/23 1445 06/29/23 0553  LIPASE 521* 155*   No results for input(s): "AMMONIA" in the last 168 hours. Coagulation profile Recent Labs  Lab 06/28/23 1654  INR 1.0    CBC: Recent Labs  Lab 06/28/23 1445 06/29/23 0553  WBC 8.3 8.3  HGB 11.7* 11.4*  HCT 35.3* 33.5*  MCV 81.3 80.1  PLT 258 250   Cardiac Enzymes: No results for input(s): "CKTOTAL", "CKMB", "CKMBINDEX", "TROPONINI" in the last 168 hours. BNP: Invalid input(s): "POCBNP" CBG: No results for input(s): "GLUCAP" in the last 168 hours. D-Dimer No results for input(s): "DDIMER" in the last 72 hours. Hgb A1c No results for input(s): "HGBA1C" in the last 72 hours. Lipid Profile No  results for input(s): "CHOL", "HDL", "LDLCALC", "TRIG", "CHOLHDL", "LDLDIRECT" in the last 72 hours. Thyroid function studies No results  for input(s): "TSH", "T4TOTAL", "T3FREE", "THYROIDAB" in the last 72 hours.  Invalid input(s): "FREET3" Anemia work up No results for input(s): "VITAMINB12", "FOLATE", "FERRITIN", "TIBC", "IRON", "RETICCTPCT" in the last 72 hours. Microbiology No results found for this or any previous visit (from the past 240 hours).   Discharge Instructions:   Discharge Instructions     Diet - low sodium heart healthy   Complete by: As directed    Discharge instructions   Complete by: As directed    ERCP/EUS outpatient   Increase activity slowly   Complete by: As directed       Allergies as of 06/29/2023       Reactions   Aldactone [spironolactone] Other (See Comments)   Breast tenderness   Amlodipine Other (See Comments)   Hot flash   Lisinopril Cough   Penicillins    Thiazide-type Diuretics Other (See Comments)   Hypokalemia.        Medication List     STOP taking these medications    pravastatin 20 MG tablet Commonly known as: PRAVACHOL       TAKE these medications    calcium carbonate 1500 (600 Ca) MG Tabs tablet Commonly known as: OSCAL Take 600 mg of elemental calcium by mouth 2 (two) times daily with a meal.   cholecalciferol 25 MCG (1000 UNIT) tablet Commonly known as: VITAMIN D3 Take 1,000 Units by mouth daily.   estradiol 0.5 MG tablet Commonly known as: ESTRACE Take 0.5 mg by mouth daily.   irbesartan 150 MG tablet Commonly known as: AVAPRO TAKE 1 TABLET BY MOUTH DAILY   multivitamin with minerals tablet Take 1 tablet by mouth daily.   polyethylene glycol powder 17 GM/SCOOP powder Commonly known as: MiraLax 1 scoop dissolved in fluid of choice daily until bowel movement.   traMADol 50 MG tablet Commonly known as: ULTRAM Take 1 tablet (50 mg total) by mouth every 8 (eight) hours as needed for moderate pain (pain score 4-6) (mild pain).        Follow-up Information     Mliss Sax, MD Follow up.   Specialty: Family  Medicine Contact information: [address] [place] [address] [phone]                  Time coordinating discharge: 45 min  Signed:  Joseph Art DO  [facility] 06/30/2023, 1:06 PM

## 2023-07-01 ENCOUNTER — Ambulatory Visit (HOSPITAL_COMMUNITY): Admit: 2023-07-01 | Payer: Medicare Other | Admitting: Gastroenterology

## 2023-07-01 ENCOUNTER — Encounter (HOSPITAL_COMMUNITY): Payer: Self-pay

## 2023-07-01 LAB — AFP TUMOR MARKER: AFP, Serum, Tumor Marker: 3.8 ng/mL (ref 0.0–9.2)

## 2023-07-01 LAB — CANCER ANTIGEN 19-9: CA 19-9: 110 U/mL — ABNORMAL HIGH (ref 0–35)

## 2023-07-01 LAB — CEA: CEA: 2.3 ng/mL (ref 0.0–4.7)

## 2023-07-01 SURGERY — ENDOSCOPIC RETROGRADE CHOLANGIOPANCREATOGRAPHY (ERCP) WITH PROPOFOL
Anesthesia: General

## 2023-07-02 DIAGNOSIS — C250 Malignant neoplasm of head of pancreas: Secondary | ICD-10-CM | POA: Insufficient documentation

## 2023-07-05 ENCOUNTER — Telehealth: Payer: Self-pay

## 2023-07-05 NOTE — Transitions of Care (Post Inpatient/ED Visit) (Signed)
07/05/2023  Name: Tonya Bowers MRN: [id number] DOB: March 20, 1951  Today's TOC FU Call Status: Today's TOC FU Call Status:: Successful TOC FU Call Completed TOC FU Call Complete Date: 07/05/23 Patient's Name and Date of Birth confirmed.  Transition Care Management Follow-up Telephone Call Date of Discharge: 07/02/23 Discharge Facility: Other [facility]) Name of Other (Non[facility]) Discharge Facility: [facility] Type of Discharge: Inpatient Admission Primary Inpatient Discharge Diagnosis:: Pancreatic adenocarcinoma How have you been since you were released from the hospital?: Better ("just a little soreness") Any questions or concerns?: No  Items Reviewed: Did you receive and understand the discharge instructions provided?: Yes Medications obtained,verified, and reconciled?: Yes (Medications Reviewed) Any new allergies since your discharge?: No Dietary orders reviewed?: Yes Type of Diet Ordered:: Regular diet Do you have support at home?: Yes People in Home: sibling(s), spouse Name of Support/Comfort Primary Source: Spouse -Rojelio Brenner and Sister is a Engineer, civil (consulting)  Medications Reviewed Today: Medications Reviewed Today     Reviewed by Wyline Mood, RN (Case Manager) on 07/05/23 at 1229  Med List Status: <None>   Medication Order Taking? Sig Documenting Provider Last Dose Status Informant  calcium carbonate (OSCAL) 1500 (600 Ca) MG TABS tablet [id number] Yes Take 600 mg of elemental calcium by mouth 2 (two) times daily with a meal. [provider] Taking Active Self, Pharmacy Records  cholecalciferol (VITAMIN D3) 25 MCG (1000 UNIT) tablet [id number] Yes Take 1,000 Units by mouth daily. [provider] Taking Active Self, Pharmacy Records  estradiol (ESTRACE) 0.5 MG tablet [id number] Yes Take 0.5 mg by mouth daily. [provider] Taking Active Self, Pharmacy Records  irbesartan (AVAPRO) 150 MG tablet [id number] Yes TAKE 1 TABLET BY MOUTH DAILY  Mliss Sax, MD Taking Active Self, Pharmacy Records  Multiple Vitamins-Minerals (MULTIVITAMIN WITH MINERALS) tablet [id number] Yes Take 1 tablet by mouth daily. [provider] Taking Active Self, Pharmacy Records  polyethylene glycol powder (MIRALAX) 17 GM/SCOOP powder [id number] Yes 1 scoop dissolved in fluid of choice daily until bowel movement. Mliss Sax, MD Taking Active Self, Pharmacy Records  traMADol Janean Sark) 50 MG tablet [id number] Yes Take 1 tablet (50 mg total) by mouth every 8 (eight) hours as needed for moderate pain (pain score 4-6) (mild pain). Joseph Art, DO Taking Active             Home Care and Equipment/Supplies: Were Home Health Services Ordered?: No Any new equipment or medical supplies ordered?: No  Functional Questionnaire: Do you need assistance with bathing/showering or dressing?: No Do you need assistance with meal preparation?: No Do you need assistance with eating?: No Do you have difficulty maintaining continence: No Do you need assistance with getting out of bed/getting out of a chair/moving?: No Do you have difficulty managing or taking your medications?: No  Follow up appointments reviewed: PCP Follow-up appointment confirmed?: Yes Date of PCP follow-up appointment?: 07/13/23 Follow-up Provider: Dr. Doreene Burke Specialist [facility], [facility] Follow-up appointment confirmed?: Yes Date of Specialist follow-up appointment?: 07/12/23 Follow-Up Specialty Provider:: Med-Surg Oncologist [facility] - Dr. Selena Batten and Dr. Rozell Searing Do you need transportation to your follow-up appointment?: No Do you understand care options if your condition(s) worsen?: Yes-patient verbalized understanding  SDOH Interventions Today    Flowsheet Row Most Recent Value  SDOH Interventions   Food Insecurity Interventions Intervention Not Indicated  Housing Interventions Intervention Not Indicated  Transportation Interventions Intervention Not Indicated  Utilities  Interventions Intervention Not Indicated  Social Connections Interventions Intervention Not Indicated  Interventions Today    Flowsheet Row Most Recent Value  General Interventions   General Interventions Discussed/Reviewed Doctor Visits  Doctor Visits Discussed/Reviewed Doctor Visits Discussed, Doctor Visits Reviewed, PCP, Specialist  PCP/Specialist Visits Compliance with follow-up visit  Education Interventions   Education Provided Provided Education  Provided Verbal Education On Labs, Medication, When to see the doctor, Insurance Plans  Pharmacy Interventions   Pharmacy Dicussed/Reviewed Medications and their functions  Safety Interventions   Safety Discussed/Reviewed Safety Discussed, Safety Reviewed       TOC Interventions Today    Flowsheet Row Most Recent Value  TOC Interventions   TOC Interventions Discussed/Reviewed TOC Interventions Discussed, TOC Interventions Reviewed, Post discharge activity limitations per provider, S/S of infection, self-management      Patient reports she is doing well post-hospitalization.  Reports mild abdominal "soreness", pain scale 3/10; bowels regular; denies nausea, vomiting, diarrhea.  Patient aware of upcoming PCP and Oncology appointments and has reliable transportation to her appointments.  Patient has been contacted by and is being followed by the Oncology RN Navigator with [facility]. No acute/unmet needs identified.  Patient offered and declined enrollment in TOC 30 day program at this time.  No further TOC interventions needed.  Patient provided RNCM contact information should post-discharge needs, questions or concerns arise after call today.  Kamareon Sciandra Daphine Deutscher BSN, RN RN Care Manager   Transitions of Care [facility] Direct Dial Number:  [phone]

## 2023-07-05 NOTE — Transitions of Care (Post Inpatient/ED Visit) (Deleted)
   07/05/2023  Name: ONIYAH HERTZBERG MRN: [id number] DOB: Nov 23, 1950  {[id number]}

## 2023-07-05 NOTE — Transitions of Care (Post Inpatient/ED Visit) (Deleted)
   07/05/2023  Name: Tonya Bowers MRN: [id number] DOB: Nov 23, 1950  {AMBTOCFU:[id number]}

## 2023-07-12 ENCOUNTER — Inpatient Hospital Stay: Payer: Medicare Other | Admitting: Family Medicine

## 2023-07-12 DIAGNOSIS — C250 Malignant neoplasm of head of pancreas: Secondary | ICD-10-CM | POA: Diagnosis not present

## 2023-07-12 DIAGNOSIS — I10 Essential (primary) hypertension: Secondary | ICD-10-CM | POA: Diagnosis not present

## 2023-07-12 DIAGNOSIS — K8689 Other specified diseases of pancreas: Secondary | ICD-10-CM | POA: Diagnosis not present

## 2023-07-12 DIAGNOSIS — R599 Enlarged lymph nodes, unspecified: Secondary | ICD-10-CM | POA: Diagnosis not present

## 2023-07-12 DIAGNOSIS — K831 Obstruction of bile duct: Secondary | ICD-10-CM | POA: Diagnosis not present

## 2023-07-12 DIAGNOSIS — Z880 Allergy status to penicillin: Secondary | ICD-10-CM | POA: Diagnosis not present

## 2023-07-12 DIAGNOSIS — Z79899 Other long term (current) drug therapy: Secondary | ICD-10-CM | POA: Diagnosis not present

## 2023-07-12 DIAGNOSIS — Z803 Family history of malignant neoplasm of breast: Secondary | ICD-10-CM | POA: Diagnosis not present

## 2023-07-12 DIAGNOSIS — K862 Cyst of pancreas: Secondary | ICD-10-CM | POA: Diagnosis not present

## 2023-07-13 ENCOUNTER — Inpatient Hospital Stay: Payer: Medicare Other | Admitting: Family Medicine

## 2023-07-23 DIAGNOSIS — Z880 Allergy status to penicillin: Secondary | ICD-10-CM | POA: Diagnosis not present

## 2023-07-23 DIAGNOSIS — C259 Malignant neoplasm of pancreas, unspecified: Secondary | ICD-10-CM | POA: Diagnosis not present

## 2023-07-23 DIAGNOSIS — M1990 Unspecified osteoarthritis, unspecified site: Secondary | ICD-10-CM | POA: Diagnosis not present

## 2023-07-23 DIAGNOSIS — Z79899 Other long term (current) drug therapy: Secondary | ICD-10-CM | POA: Diagnosis not present

## 2023-07-23 DIAGNOSIS — I10 Essential (primary) hypertension: Secondary | ICD-10-CM | POA: Diagnosis not present

## 2023-07-23 DIAGNOSIS — Z452 Encounter for adjustment and management of vascular access device: Secondary | ICD-10-CM | POA: Diagnosis not present

## 2023-07-23 DIAGNOSIS — Z888 Allergy status to other drugs, medicaments and biological substances status: Secondary | ICD-10-CM | POA: Diagnosis not present

## 2023-07-28 DIAGNOSIS — Z79899 Other long term (current) drug therapy: Secondary | ICD-10-CM | POA: Diagnosis not present

## 2023-07-28 DIAGNOSIS — C250 Malignant neoplasm of head of pancreas: Secondary | ICD-10-CM | POA: Diagnosis not present

## 2023-07-29 DIAGNOSIS — C250 Malignant neoplasm of head of pancreas: Secondary | ICD-10-CM | POA: Diagnosis not present

## 2023-07-29 DIAGNOSIS — Z5111 Encounter for antineoplastic chemotherapy: Secondary | ICD-10-CM | POA: Diagnosis not present

## 2023-07-29 DIAGNOSIS — Z79899 Other long term (current) drug therapy: Secondary | ICD-10-CM | POA: Diagnosis not present

## 2023-07-29 DIAGNOSIS — Z880 Allergy status to penicillin: Secondary | ICD-10-CM | POA: Diagnosis not present

## 2023-07-29 DIAGNOSIS — T7840XA Allergy, unspecified, initial encounter: Secondary | ICD-10-CM | POA: Diagnosis not present

## 2023-08-02 DIAGNOSIS — Z79899 Other long term (current) drug therapy: Secondary | ICD-10-CM | POA: Diagnosis not present

## 2023-08-02 DIAGNOSIS — C250 Malignant neoplasm of head of pancreas: Secondary | ICD-10-CM | POA: Diagnosis not present

## 2023-08-11 DIAGNOSIS — C259 Malignant neoplasm of pancreas, unspecified: Secondary | ICD-10-CM | POA: Diagnosis not present

## 2023-08-11 DIAGNOSIS — C250 Malignant neoplasm of head of pancreas: Secondary | ICD-10-CM | POA: Diagnosis not present

## 2023-08-11 DIAGNOSIS — Z79899 Other long term (current) drug therapy: Secondary | ICD-10-CM | POA: Diagnosis not present

## 2023-08-11 DIAGNOSIS — I10 Essential (primary) hypertension: Secondary | ICD-10-CM | POA: Diagnosis not present

## 2023-08-12 DIAGNOSIS — Z713 Dietary counseling and surveillance: Secondary | ICD-10-CM | POA: Diagnosis not present

## 2023-08-12 DIAGNOSIS — Z5111 Encounter for antineoplastic chemotherapy: Secondary | ICD-10-CM | POA: Diagnosis not present

## 2023-08-12 DIAGNOSIS — Z79899 Other long term (current) drug therapy: Secondary | ICD-10-CM | POA: Diagnosis not present

## 2023-08-12 DIAGNOSIS — C250 Malignant neoplasm of head of pancreas: Secondary | ICD-10-CM | POA: Diagnosis not present

## 2023-08-16 DIAGNOSIS — C250 Malignant neoplasm of head of pancreas: Secondary | ICD-10-CM | POA: Diagnosis not present

## 2023-08-16 DIAGNOSIS — Z79899 Other long term (current) drug therapy: Secondary | ICD-10-CM | POA: Diagnosis not present

## 2023-09-01 DIAGNOSIS — R197 Diarrhea, unspecified: Secondary | ICD-10-CM | POA: Diagnosis not present

## 2023-09-01 DIAGNOSIS — T451X5A Adverse effect of antineoplastic and immunosuppressive drugs, initial encounter: Secondary | ICD-10-CM | POA: Diagnosis not present

## 2023-09-01 DIAGNOSIS — C250 Malignant neoplasm of head of pancreas: Secondary | ICD-10-CM | POA: Diagnosis not present

## 2023-09-01 DIAGNOSIS — R112 Nausea with vomiting, unspecified: Secondary | ICD-10-CM | POA: Diagnosis not present

## 2023-09-01 DIAGNOSIS — I10 Essential (primary) hypertension: Secondary | ICD-10-CM | POA: Diagnosis not present

## 2023-09-01 DIAGNOSIS — C259 Malignant neoplasm of pancreas, unspecified: Secondary | ICD-10-CM | POA: Diagnosis not present

## 2023-09-01 DIAGNOSIS — Z79899 Other long term (current) drug therapy: Secondary | ICD-10-CM | POA: Diagnosis not present

## 2023-09-01 DIAGNOSIS — K5900 Constipation, unspecified: Secondary | ICD-10-CM | POA: Diagnosis not present

## 2023-09-01 DIAGNOSIS — Z803 Family history of malignant neoplasm of breast: Secondary | ICD-10-CM | POA: Diagnosis not present

## 2023-09-02 DIAGNOSIS — C250 Malignant neoplasm of head of pancreas: Secondary | ICD-10-CM | POA: Diagnosis not present

## 2023-09-02 DIAGNOSIS — Z79899 Other long term (current) drug therapy: Secondary | ICD-10-CM | POA: Diagnosis not present

## 2023-09-02 DIAGNOSIS — Z5111 Encounter for antineoplastic chemotherapy: Secondary | ICD-10-CM | POA: Diagnosis not present

## 2023-09-06 DIAGNOSIS — Z79899 Other long term (current) drug therapy: Secondary | ICD-10-CM | POA: Diagnosis not present

## 2023-09-06 DIAGNOSIS — C250 Malignant neoplasm of head of pancreas: Secondary | ICD-10-CM | POA: Diagnosis not present

## 2023-09-15 DIAGNOSIS — C250 Malignant neoplasm of head of pancreas: Secondary | ICD-10-CM | POA: Diagnosis not present

## 2023-09-15 DIAGNOSIS — Z79899 Other long term (current) drug therapy: Secondary | ICD-10-CM | POA: Diagnosis not present

## 2023-09-15 DIAGNOSIS — Z5111 Encounter for antineoplastic chemotherapy: Secondary | ICD-10-CM | POA: Diagnosis not present

## 2023-09-16 DIAGNOSIS — H524 Presbyopia: Secondary | ICD-10-CM | POA: Diagnosis not present

## 2023-09-16 DIAGNOSIS — H52223 Regular astigmatism, bilateral: Secondary | ICD-10-CM | POA: Diagnosis not present

## 2023-09-16 DIAGNOSIS — H2513 Age-related nuclear cataract, bilateral: Secondary | ICD-10-CM | POA: Diagnosis not present

## 2023-09-20 DIAGNOSIS — Z79899 Other long term (current) drug therapy: Secondary | ICD-10-CM | POA: Diagnosis not present

## 2023-09-20 DIAGNOSIS — C250 Malignant neoplasm of head of pancreas: Secondary | ICD-10-CM | POA: Diagnosis not present

## 2023-09-29 DIAGNOSIS — C250 Malignant neoplasm of head of pancreas: Secondary | ICD-10-CM | POA: Diagnosis not present

## 2023-09-29 DIAGNOSIS — Z5111 Encounter for antineoplastic chemotherapy: Secondary | ICD-10-CM | POA: Diagnosis not present

## 2023-09-29 DIAGNOSIS — K5900 Constipation, unspecified: Secondary | ICD-10-CM | POA: Diagnosis not present

## 2023-09-29 DIAGNOSIS — I10 Essential (primary) hypertension: Secondary | ICD-10-CM | POA: Diagnosis not present

## 2023-09-29 DIAGNOSIS — R011 Cardiac murmur, unspecified: Secondary | ICD-10-CM | POA: Diagnosis not present

## 2023-09-29 DIAGNOSIS — C259 Malignant neoplasm of pancreas, unspecified: Secondary | ICD-10-CM | POA: Diagnosis not present

## 2023-09-29 DIAGNOSIS — R918 Other nonspecific abnormal finding of lung field: Secondary | ICD-10-CM | POA: Diagnosis not present

## 2023-09-29 DIAGNOSIS — Z79899 Other long term (current) drug therapy: Secondary | ICD-10-CM | POA: Diagnosis not present

## 2023-10-04 DIAGNOSIS — Z79899 Other long term (current) drug therapy: Secondary | ICD-10-CM | POA: Diagnosis not present

## 2023-10-04 DIAGNOSIS — C250 Malignant neoplasm of head of pancreas: Secondary | ICD-10-CM | POA: Diagnosis not present

## 2023-10-13 DIAGNOSIS — Z79899 Other long term (current) drug therapy: Secondary | ICD-10-CM | POA: Diagnosis not present

## 2023-10-13 DIAGNOSIS — Z5111 Encounter for antineoplastic chemotherapy: Secondary | ICD-10-CM | POA: Diagnosis not present

## 2023-10-13 DIAGNOSIS — C250 Malignant neoplasm of head of pancreas: Secondary | ICD-10-CM | POA: Diagnosis not present

## 2023-10-18 DIAGNOSIS — C250 Malignant neoplasm of head of pancreas: Secondary | ICD-10-CM | POA: Diagnosis not present

## 2023-10-18 DIAGNOSIS — Z79899 Other long term (current) drug therapy: Secondary | ICD-10-CM | POA: Diagnosis not present

## 2023-10-21 DIAGNOSIS — D649 Anemia, unspecified: Secondary | ICD-10-CM | POA: Diagnosis not present

## 2023-10-21 DIAGNOSIS — C250 Malignant neoplasm of head of pancreas: Secondary | ICD-10-CM | POA: Diagnosis not present

## 2023-10-21 DIAGNOSIS — R1012 Left upper quadrant pain: Secondary | ICD-10-CM | POA: Diagnosis not present

## 2023-10-21 DIAGNOSIS — K8689 Other specified diseases of pancreas: Secondary | ICD-10-CM | POA: Diagnosis not present

## 2023-10-21 DIAGNOSIS — E611 Iron deficiency: Secondary | ICD-10-CM | POA: Diagnosis not present

## 2023-10-21 DIAGNOSIS — E785 Hyperlipidemia, unspecified: Secondary | ICD-10-CM | POA: Diagnosis not present

## 2023-10-21 DIAGNOSIS — C259 Malignant neoplasm of pancreas, unspecified: Secondary | ICD-10-CM | POA: Diagnosis not present

## 2023-10-21 DIAGNOSIS — K869 Disease of pancreas, unspecified: Secondary | ICD-10-CM | POA: Diagnosis not present

## 2023-10-21 DIAGNOSIS — Z9049 Acquired absence of other specified parts of digestive tract: Secondary | ICD-10-CM | POA: Diagnosis not present

## 2023-10-21 DIAGNOSIS — K831 Obstruction of bile duct: Secondary | ICD-10-CM | POA: Diagnosis not present

## 2023-10-21 DIAGNOSIS — K838 Other specified diseases of biliary tract: Secondary | ICD-10-CM | POA: Diagnosis not present

## 2023-10-21 DIAGNOSIS — I10 Essential (primary) hypertension: Secondary | ICD-10-CM | POA: Diagnosis not present

## 2023-10-21 DIAGNOSIS — R7989 Other specified abnormal findings of blood chemistry: Secondary | ICD-10-CM | POA: Diagnosis not present

## 2023-10-21 DIAGNOSIS — Z880 Allergy status to penicillin: Secondary | ICD-10-CM | POA: Diagnosis not present

## 2023-10-21 DIAGNOSIS — Z4659 Encounter for fitting and adjustment of other gastrointestinal appliance and device: Secondary | ICD-10-CM | POA: Diagnosis not present

## 2023-10-21 DIAGNOSIS — K8051 Calculus of bile duct without cholangitis or cholecystitis with obstruction: Secondary | ICD-10-CM | POA: Diagnosis not present

## 2023-10-21 DIAGNOSIS — R17 Unspecified jaundice: Secondary | ICD-10-CM | POA: Diagnosis not present

## 2023-10-21 DIAGNOSIS — D696 Thrombocytopenia, unspecified: Secondary | ICD-10-CM | POA: Diagnosis not present

## 2023-10-21 DIAGNOSIS — T85590A Other mechanical complication of bile duct prosthesis, initial encounter: Secondary | ICD-10-CM | POA: Diagnosis not present

## 2023-10-21 DIAGNOSIS — R7401 Elevation of levels of liver transaminase levels: Secondary | ICD-10-CM | POA: Diagnosis not present

## 2023-10-21 DIAGNOSIS — Z7901 Long term (current) use of anticoagulants: Secondary | ICD-10-CM | POA: Diagnosis not present

## 2023-10-21 DIAGNOSIS — K8309 Other cholangitis: Secondary | ICD-10-CM | POA: Diagnosis not present

## 2023-10-21 DIAGNOSIS — T85590S Other mechanical complication of bile duct prosthesis, sequela: Secondary | ICD-10-CM | POA: Diagnosis not present

## 2023-10-22 DIAGNOSIS — C250 Malignant neoplasm of head of pancreas: Secondary | ICD-10-CM | POA: Diagnosis not present

## 2023-10-22 DIAGNOSIS — R17 Unspecified jaundice: Secondary | ICD-10-CM | POA: Diagnosis not present

## 2023-10-22 DIAGNOSIS — R1012 Left upper quadrant pain: Secondary | ICD-10-CM | POA: Diagnosis not present

## 2023-10-22 DIAGNOSIS — T85590A Other mechanical complication of bile duct prosthesis, initial encounter: Secondary | ICD-10-CM | POA: Insufficient documentation

## 2023-10-27 DIAGNOSIS — T451X5A Adverse effect of antineoplastic and immunosuppressive drugs, initial encounter: Secondary | ICD-10-CM | POA: Diagnosis not present

## 2023-10-27 DIAGNOSIS — G620 Drug-induced polyneuropathy: Secondary | ICD-10-CM | POA: Diagnosis not present

## 2023-10-27 DIAGNOSIS — Z79899 Other long term (current) drug therapy: Secondary | ICD-10-CM | POA: Diagnosis not present

## 2023-10-27 DIAGNOSIS — I10 Essential (primary) hypertension: Secondary | ICD-10-CM | POA: Diagnosis not present

## 2023-10-27 DIAGNOSIS — K5900 Constipation, unspecified: Secondary | ICD-10-CM | POA: Diagnosis not present

## 2023-10-27 DIAGNOSIS — T85520A Displacement of bile duct prosthesis, initial encounter: Secondary | ICD-10-CM | POA: Diagnosis not present

## 2023-10-27 DIAGNOSIS — K8689 Other specified diseases of pancreas: Secondary | ICD-10-CM | POA: Diagnosis not present

## 2023-10-27 DIAGNOSIS — C250 Malignant neoplasm of head of pancreas: Secondary | ICD-10-CM | POA: Diagnosis not present

## 2023-10-27 DIAGNOSIS — K831 Obstruction of bile duct: Secondary | ICD-10-CM | POA: Diagnosis not present

## 2023-10-27 DIAGNOSIS — I071 Rheumatic tricuspid insufficiency: Secondary | ICD-10-CM | POA: Diagnosis not present

## 2023-11-10 DIAGNOSIS — Z79899 Other long term (current) drug therapy: Secondary | ICD-10-CM | POA: Diagnosis not present

## 2023-11-10 DIAGNOSIS — C250 Malignant neoplasm of head of pancreas: Secondary | ICD-10-CM | POA: Diagnosis not present

## 2023-11-10 DIAGNOSIS — Z5111 Encounter for antineoplastic chemotherapy: Secondary | ICD-10-CM | POA: Diagnosis not present

## 2023-11-15 DIAGNOSIS — R1900 Intra-abdominal and pelvic swelling, mass and lump, unspecified site: Secondary | ICD-10-CM | POA: Diagnosis not present

## 2023-11-15 DIAGNOSIS — C250 Malignant neoplasm of head of pancreas: Secondary | ICD-10-CM | POA: Diagnosis not present

## 2023-11-15 DIAGNOSIS — Z79899 Other long term (current) drug therapy: Secondary | ICD-10-CM | POA: Diagnosis not present

## 2023-11-24 DIAGNOSIS — Z5111 Encounter for antineoplastic chemotherapy: Secondary | ICD-10-CM | POA: Diagnosis not present

## 2023-11-24 DIAGNOSIS — Z79899 Other long term (current) drug therapy: Secondary | ICD-10-CM | POA: Diagnosis not present

## 2023-11-24 DIAGNOSIS — C250 Malignant neoplasm of head of pancreas: Secondary | ICD-10-CM | POA: Diagnosis not present

## 2023-11-27 ENCOUNTER — Other Ambulatory Visit: Payer: Self-pay | Admitting: Family Medicine

## 2023-11-27 DIAGNOSIS — I10 Essential (primary) hypertension: Secondary | ICD-10-CM

## 2023-11-27 DIAGNOSIS — C250 Malignant neoplasm of head of pancreas: Secondary | ICD-10-CM | POA: Diagnosis not present

## 2023-11-27 DIAGNOSIS — Z79899 Other long term (current) drug therapy: Secondary | ICD-10-CM | POA: Diagnosis not present

## 2023-12-08 DIAGNOSIS — Z79899 Other long term (current) drug therapy: Secondary | ICD-10-CM | POA: Diagnosis not present

## 2023-12-08 DIAGNOSIS — C250 Malignant neoplasm of head of pancreas: Secondary | ICD-10-CM | POA: Diagnosis not present

## 2023-12-13 DIAGNOSIS — C250 Malignant neoplasm of head of pancreas: Secondary | ICD-10-CM | POA: Diagnosis not present

## 2023-12-22 DIAGNOSIS — Z79899 Other long term (current) drug therapy: Secondary | ICD-10-CM | POA: Diagnosis not present

## 2023-12-22 DIAGNOSIS — I10 Essential (primary) hypertension: Secondary | ICD-10-CM | POA: Diagnosis not present

## 2023-12-22 DIAGNOSIS — K5900 Constipation, unspecified: Secondary | ICD-10-CM | POA: Diagnosis not present

## 2023-12-22 DIAGNOSIS — R3915 Urgency of urination: Secondary | ICD-10-CM | POA: Diagnosis not present

## 2023-12-22 DIAGNOSIS — K8689 Other specified diseases of pancreas: Secondary | ICD-10-CM | POA: Diagnosis not present

## 2023-12-22 DIAGNOSIS — C250 Malignant neoplasm of head of pancreas: Secondary | ICD-10-CM | POA: Diagnosis not present

## 2023-12-22 DIAGNOSIS — R300 Dysuria: Secondary | ICD-10-CM | POA: Diagnosis not present

## 2023-12-22 DIAGNOSIS — Z5111 Encounter for antineoplastic chemotherapy: Secondary | ICD-10-CM | POA: Diagnosis not present

## 2023-12-22 DIAGNOSIS — K831 Obstruction of bile duct: Secondary | ICD-10-CM | POA: Diagnosis not present

## 2023-12-27 DIAGNOSIS — C250 Malignant neoplasm of head of pancreas: Secondary | ICD-10-CM | POA: Diagnosis not present

## 2023-12-27 DIAGNOSIS — Z79899 Other long term (current) drug therapy: Secondary | ICD-10-CM | POA: Diagnosis not present

## 2024-01-05 DIAGNOSIS — C250 Malignant neoplasm of head of pancreas: Secondary | ICD-10-CM | POA: Diagnosis not present

## 2024-01-05 DIAGNOSIS — Z79899 Other long term (current) drug therapy: Secondary | ICD-10-CM | POA: Diagnosis not present

## 2024-01-10 DIAGNOSIS — C250 Malignant neoplasm of head of pancreas: Secondary | ICD-10-CM | POA: Diagnosis not present

## 2024-01-10 DIAGNOSIS — Z79899 Other long term (current) drug therapy: Secondary | ICD-10-CM | POA: Diagnosis not present

## 2024-01-19 DIAGNOSIS — K5900 Constipation, unspecified: Secondary | ICD-10-CM | POA: Diagnosis not present

## 2024-01-19 DIAGNOSIS — I10 Essential (primary) hypertension: Secondary | ICD-10-CM | POA: Diagnosis not present

## 2024-01-19 DIAGNOSIS — Z5111 Encounter for antineoplastic chemotherapy: Secondary | ICD-10-CM | POA: Diagnosis not present

## 2024-01-19 DIAGNOSIS — C250 Malignant neoplasm of head of pancreas: Secondary | ICD-10-CM | POA: Diagnosis not present

## 2024-01-19 DIAGNOSIS — Z79899 Other long term (current) drug therapy: Secondary | ICD-10-CM | POA: Diagnosis not present

## 2024-01-24 DIAGNOSIS — Z79899 Other long term (current) drug therapy: Secondary | ICD-10-CM | POA: Diagnosis not present

## 2024-01-24 DIAGNOSIS — C250 Malignant neoplasm of head of pancreas: Secondary | ICD-10-CM | POA: Diagnosis not present

## 2024-02-07 DIAGNOSIS — K8689 Other specified diseases of pancreas: Secondary | ICD-10-CM | POA: Diagnosis not present

## 2024-02-07 DIAGNOSIS — R918 Other nonspecific abnormal finding of lung field: Secondary | ICD-10-CM | POA: Diagnosis not present

## 2024-02-07 DIAGNOSIS — R200 Anesthesia of skin: Secondary | ICD-10-CM | POA: Diagnosis not present

## 2024-02-07 DIAGNOSIS — R791 Abnormal coagulation profile: Secondary | ICD-10-CM | POA: Diagnosis not present

## 2024-02-07 DIAGNOSIS — C250 Malignant neoplasm of head of pancreas: Secondary | ICD-10-CM | POA: Diagnosis not present

## 2024-02-07 DIAGNOSIS — R011 Cardiac murmur, unspecified: Secondary | ICD-10-CM | POA: Diagnosis not present

## 2024-02-07 DIAGNOSIS — K838 Other specified diseases of biliary tract: Secondary | ICD-10-CM | POA: Diagnosis not present

## 2024-02-07 DIAGNOSIS — I10 Essential (primary) hypertension: Secondary | ICD-10-CM | POA: Diagnosis not present

## 2024-02-07 DIAGNOSIS — K5900 Constipation, unspecified: Secondary | ICD-10-CM | POA: Diagnosis not present

## 2024-02-07 DIAGNOSIS — R7401 Elevation of levels of liver transaminase levels: Secondary | ICD-10-CM | POA: Diagnosis not present

## 2024-02-07 DIAGNOSIS — K689 Other disorders of retroperitoneum: Secondary | ICD-10-CM | POA: Diagnosis not present

## 2024-02-07 DIAGNOSIS — R202 Paresthesia of skin: Secondary | ICD-10-CM | POA: Diagnosis not present

## 2024-02-21 DIAGNOSIS — C250 Malignant neoplasm of head of pancreas: Secondary | ICD-10-CM | POA: Diagnosis not present

## 2024-02-21 DIAGNOSIS — Z95828 Presence of other vascular implants and grafts: Secondary | ICD-10-CM | POA: Diagnosis not present

## 2024-03-01 ENCOUNTER — Other Ambulatory Visit: Payer: Self-pay | Admitting: Family Medicine

## 2024-03-01 DIAGNOSIS — I10 Essential (primary) hypertension: Secondary | ICD-10-CM

## 2024-03-02 DIAGNOSIS — I10 Essential (primary) hypertension: Secondary | ICD-10-CM | POA: Diagnosis not present

## 2024-03-02 DIAGNOSIS — K560 Paralytic ileus: Secondary | ICD-10-CM | POA: Diagnosis not present

## 2024-03-02 DIAGNOSIS — D696 Thrombocytopenia, unspecified: Secondary | ICD-10-CM | POA: Diagnosis not present

## 2024-03-02 DIAGNOSIS — R109 Unspecified abdominal pain: Secondary | ICD-10-CM | POA: Diagnosis not present

## 2024-03-02 DIAGNOSIS — Z452 Encounter for adjustment and management of vascular access device: Secondary | ICD-10-CM | POA: Diagnosis not present

## 2024-03-02 DIAGNOSIS — C250 Malignant neoplasm of head of pancreas: Secondary | ICD-10-CM | POA: Diagnosis not present

## 2024-03-02 DIAGNOSIS — E440 Moderate protein-calorie malnutrition: Secondary | ICD-10-CM | POA: Diagnosis not present

## 2024-03-02 DIAGNOSIS — G8918 Other acute postprocedural pain: Secondary | ICD-10-CM | POA: Diagnosis not present

## 2024-03-02 DIAGNOSIS — K9189 Other postprocedural complications and disorders of digestive system: Secondary | ICD-10-CM | POA: Diagnosis not present

## 2024-03-02 DIAGNOSIS — C259 Malignant neoplasm of pancreas, unspecified: Secondary | ICD-10-CM | POA: Diagnosis not present

## 2024-03-02 DIAGNOSIS — R911 Solitary pulmonary nodule: Secondary | ICD-10-CM | POA: Diagnosis not present

## 2024-03-02 DIAGNOSIS — Z980 Intestinal bypass and anastomosis status: Secondary | ICD-10-CM | POA: Diagnosis not present

## 2024-03-02 DIAGNOSIS — Z9221 Personal history of antineoplastic chemotherapy: Secondary | ICD-10-CM | POA: Diagnosis not present

## 2024-03-02 DIAGNOSIS — Z7409 Other reduced mobility: Secondary | ICD-10-CM | POA: Diagnosis not present

## 2024-03-02 DIAGNOSIS — K91840 Postprocedural hemorrhage and hematoma of a digestive system organ or structure following a digestive system procedure: Secondary | ICD-10-CM | POA: Diagnosis not present

## 2024-03-02 DIAGNOSIS — J90 Pleural effusion, not elsewhere classified: Secondary | ICD-10-CM | POA: Diagnosis not present

## 2024-03-02 DIAGNOSIS — E785 Hyperlipidemia, unspecified: Secondary | ICD-10-CM | POA: Diagnosis not present

## 2024-03-02 DIAGNOSIS — Z880 Allergy status to penicillin: Secondary | ICD-10-CM | POA: Diagnosis not present

## 2024-03-02 DIAGNOSIS — R188 Other ascites: Secondary | ICD-10-CM | POA: Diagnosis not present

## 2024-03-02 DIAGNOSIS — Z4682 Encounter for fitting and adjustment of non-vascular catheter: Secondary | ICD-10-CM | POA: Diagnosis not present

## 2024-03-02 DIAGNOSIS — D62 Acute posthemorrhagic anemia: Secondary | ICD-10-CM | POA: Diagnosis not present

## 2024-03-02 DIAGNOSIS — D649 Anemia, unspecified: Secondary | ICD-10-CM | POA: Diagnosis not present

## 2024-03-02 DIAGNOSIS — R140 Abdominal distension (gaseous): Secondary | ICD-10-CM | POA: Diagnosis not present

## 2024-03-02 DIAGNOSIS — K861 Other chronic pancreatitis: Secondary | ICD-10-CM | POA: Diagnosis not present

## 2024-03-02 DIAGNOSIS — Z8507 Personal history of malignant neoplasm of pancreas: Secondary | ICD-10-CM | POA: Diagnosis not present

## 2024-03-02 DIAGNOSIS — K284 Chronic or unspecified gastrojejunal ulcer with hemorrhage: Secondary | ICD-10-CM | POA: Diagnosis not present

## 2024-03-02 DIAGNOSIS — R112 Nausea with vomiting, unspecified: Secondary | ICD-10-CM | POA: Diagnosis not present

## 2024-03-02 DIAGNOSIS — K922 Gastrointestinal hemorrhage, unspecified: Secondary | ICD-10-CM | POA: Diagnosis not present

## 2024-03-02 DIAGNOSIS — C772 Secondary and unspecified malignant neoplasm of intra-abdominal lymph nodes: Secondary | ICD-10-CM | POA: Diagnosis not present

## 2024-03-02 HISTORY — PX: OTHER SURGICAL HISTORY: SHX169

## 2024-03-02 HISTORY — PX: WHIPPLE PROCEDURE: SHX2667

## 2024-03-27 DIAGNOSIS — C250 Malignant neoplasm of head of pancreas: Secondary | ICD-10-CM | POA: Diagnosis not present

## 2024-04-13 DIAGNOSIS — C250 Malignant neoplasm of head of pancreas: Secondary | ICD-10-CM | POA: Diagnosis not present

## 2024-04-13 DIAGNOSIS — T451X5A Adverse effect of antineoplastic and immunosuppressive drugs, initial encounter: Secondary | ICD-10-CM | POA: Diagnosis not present

## 2024-04-13 DIAGNOSIS — G620 Drug-induced polyneuropathy: Secondary | ICD-10-CM | POA: Diagnosis not present

## 2024-04-14 ENCOUNTER — Other Ambulatory Visit: Payer: Self-pay | Admitting: Family Medicine

## 2024-04-14 DIAGNOSIS — I10 Essential (primary) hypertension: Secondary | ICD-10-CM

## 2024-04-24 DIAGNOSIS — C259 Malignant neoplasm of pancreas, unspecified: Secondary | ICD-10-CM | POA: Diagnosis not present

## 2024-04-28 ENCOUNTER — Ambulatory Visit: Payer: Medicare Other

## 2024-04-28 ENCOUNTER — Telehealth: Payer: Self-pay

## 2024-04-28 VITALS — Ht 65.5 in | Wt 175.0 lb

## 2024-04-28 DIAGNOSIS — Z0000 Encounter for general adult medical examination without abnormal findings: Secondary | ICD-10-CM

## 2024-04-28 NOTE — Progress Notes (Signed)
 Subjective:   Tonya Bowers is a 73 y.o. who presents for a Medicare Wellness preventive visit.  As a reminder, Annual Wellness Visits don't include a physical exam, and some assessments may be limited, especially if this visit is performed virtually. We may recommend an in-person follow-up visit with your provider if needed.  Visit Complete: Virtual I connected with  Tonya Bowers on 04/28/24 by a video and audio enabled telemedicine application and verified that I am speaking with the correct person using two identifiers.  Patient Location: Home  Provider Location: Office/Clinic  I discussed the limitations of evaluation and management by telemedicine. The patient expressed understanding and agreed to proceed.  Vital Signs: Because this visit was a virtual/telehealth visit, some criteria may be missing or patient reported. Any vitals not documented were not able to be obtained and vitals that have been documented are patient reported.    Persons Participating in Visit: Patient.  AWV Questionnaire: Yes: Patient Medicare AWV questionnaire was completed by the patient on 04/25/2024; I have confirmed that all information answered by patient is correct and no changes since this date.  Cardiac Risk Factors include: advanced age (>42men, >58 women);hypertension     Objective:    Today's Vitals   04/28/24 1421  Weight: 175 lb (79.4 kg)  Height: 5' 5.5 (1.664 m)   Body mass index is 28.68 kg/m.     04/28/2024    2:27 PM 04/23/2023    2:28 PM 04/14/2022    2:18 PM 07/28/2021    3:08 PM 04/08/2021   11:52 AM 03/06/2020    3:19 PM 10/30/2016    7:49 AM  Advanced Directives  Does Patient Have a Medical Advance Directive? No No Yes No No No No   Type of Surveyor, minerals;Living will      Copy of Healthcare Power of Attorney in Chart?   No - copy requested      Would patient like information on creating a medical advance directive? No - Patient  declined   No - Patient declined No - Patient declined Yes (MAU/Ambulatory/Procedural Areas - Information given)      Data saved with a previous flowsheet row definition    Current Medications (verified) Outpatient Encounter Medications as of 04/28/2024  Medication Sig   calcium carbonate (OSCAL) 1500 (600 Ca) MG TABS tablet Take 600 mg of elemental calcium by mouth 2 (two) times daily with a meal.   cholecalciferol (VITAMIN D3) 25 MCG (1000 UNIT) tablet Take 1,000 Units by mouth daily.   CREON 24000-76000 units CPEP Take 48,000 units of lipase by mouth.   irbesartan  (AVAPRO ) 150 MG tablet TAKE 1 TABLET BY MOUTH DAILY   Multiple Vitamins-Minerals (MULTIVITAMIN WITH MINERALS) tablet Take 1 tablet by mouth daily.   pantoprazole (PROTONIX) 40 MG tablet Take 40 mg by mouth.   polyethylene glycol powder (MIRALAX ) 17 GM/SCOOP powder 1 scoop dissolved in fluid of choice daily until bowel movement.   estradiol (ESTRACE) 0.5 MG tablet Take 0.5 mg by mouth daily. (Patient not taking: Reported on 04/28/2024)   traMADol  (ULTRAM ) 50 MG tablet Take 1 tablet (50 mg total) by mouth every 8 (eight) hours as needed for moderate pain (pain score 4-6) (mild pain). (Patient not taking: Reported on 04/28/2024)   No facility-administered encounter medications on file as of 04/28/2024.    Allergies (verified) Aldactone  [spironolactone ], Amlodipine , Lisinopril , Penicillins, and Thiazide-type diuretics   History: Past Medical History:  Diagnosis Date  Arthritis    Cataract    Diverticulosis    GERD (gastroesophageal reflux disease)    Hyperplastic colon polyp 09/09/06   Past Surgical History:  Procedure Laterality Date   ABDOMINAL HYSTERECTOMY     CHOLECYSTECTOMY     COLONOSCOPY     POLYPECTOMY     TUBAL LIGATION     WHIPPLE PROCEDURE  03/02/2024   Family History  Problem Relation Age of Onset   Hypertension Mother    Hypertension Father    Kidney disease Brother        kidney transplant     Hypertension Brother    Colon cancer Neg Hx    Social History   Socioeconomic History   Marital status: Married    Spouse name: Not on file   Number of children: Not on file   Years of education: Not on file   Highest education level: Associate degree: academic program  Occupational History   Occupation: retired  Tobacco Use   Smoking status: Never   Smokeless tobacco: Never  Vaping Use   Vaping status: Never Used  Substance and Sexual Activity   Alcohol use: Not Currently    Comment: maybe occasionally; twice a year   Drug use: No   Sexual activity: Not on file  Other Topics Concern   Not on file  Social History Narrative   Not on file   Social Drivers of Health   Financial Resource Strain: Low Risk  (04/28/2024)   Overall Financial Resource Strain (CARDIA)    Difficulty of Paying Living Expenses: Not hard at all  Food Insecurity: No Food Insecurity (04/28/2024)   Hunger Vital Sign    Worried About Running Out of Food in the Last Year: Never true    Ran Out of Food in the Last Year: Never true  Transportation Needs: No Transportation Needs (04/28/2024)   PRAPARE - Administrator, Civil Service (Medical): No    Lack of Transportation (Non-Medical): No  Physical Activity: Inactive (04/28/2024)   Exercise Vital Sign    Days of Exercise per Week: 0 days    Minutes of Exercise per Session: 0 min  Stress: No Stress Concern Present (04/28/2024)   [employer] of Occupational Health - Occupational Stress Questionnaire    Feeling of Stress: Not at all  Social Connections: Moderately Integrated (04/28/2024)   Social Connection and Isolation Panel    Frequency of Communication with Friends and Family: More than three times a week    Frequency of Social Gatherings with Friends and Family: Once a week    Attends Religious Services: More than 4 times per year    Active Member of [employer] or Organizations: No    Attends Engineer, structural: Never     Marital Status: Married    Tobacco Counseling Counseling given: Not Answered    Clinical Intake:  Pre-visit preparation completed: Yes  Pain : No/denies pain     Nutritional Status: BMI 25 -29 Overweight Nutritional Risks: None Diabetes: No  Lab Results  Component Value Date   HGBA1C 6.3 06/16/2022   HGBA1C 6.0 11/02/2019     How often do you need to have someone help you when you read instructions, pamphlets, or other written materials from your doctor or pharmacy?: 1 - Never  Interpreter Needed?: No  Information entered by :: NAllen LPN   Activities of Daily Living     04/25/2024   12:10 PM 06/28/2023   10:55 PM  In your present  state of health, do you have any difficulty performing the following activities:  Hearing? 0 0  Vision? 0 0  Difficulty concentrating or making decisions? 0 0  Walking or climbing stairs? 0   Dressing or bathing? 0   Doing errands, shopping? 0   Preparing Food and eating ? N   Using the Toilet? N   In the past six months, have you accidently leaked urine? N   Do you have problems with loss of bowel control? N   Managing your Medications? N   Managing your Finances? N   Housekeeping or managing your Housekeeping? N     Patient Care Team: Berneta Elsie Sayre, MD as PCP - General (Family Medicine)  I have updated your Care Teams any recent Medical Services you may have received from other providers in the past year.     Assessment:   This is a routine wellness examination for Ayonna.  Hearing/Vision screen Hearing Screening - Comments:: Denies hearing issues Vision Screening - Comments:: Regular eye exams. Dr. Debarah   Goals Addressed             This Visit's Progress    Increase physical activity         Depression Screen     04/28/2024    2:28 PM 04/23/2023    2:29 PM 09/28/2022    3:59 PM 07/02/2022   10:31 AM 07/02/2022   10:30 AM 06/12/2022   10:31 AM 05/01/2022   10:20 AM  PHQ 2/9 Scores  PHQ - 2  Score 0 0 0 0 0 0 0  PHQ- 9 Score 0 0 0        Fall Risk     04/25/2024   12:10 PM 04/23/2023    1:26 PM 09/28/2022    3:59 PM 06/12/2022   10:54 AM 06/12/2022   10:31 AM  Fall Risk   Falls in the past year? 0 0 0 0 0  Number falls in past yr: 0 0 0 0 0  Injury with Fall? 0 0 0 0 0  Risk for fall due to : Medication side effect Medication side effect No Fall Risks No Fall Risks   Follow up Falls prevention discussed;Falls evaluation completed Falls prevention discussed;Falls evaluation completed Falls evaluation completed Falls evaluation completed       Data saved with a previous flowsheet row definition    MEDICARE RISK AT HOME:  Medicare Risk at Home Any stairs in or around the home?: (Patient-Rptd) Yes If so, are there any without handrails?: (Patient-Rptd) No Home free of loose throw rugs in walkways, pet beds, electrical cords, etc?: (Patient-Rptd) Yes Adequate lighting in your home to reduce risk of falls?: (Patient-Rptd) Yes Life alert?: (Patient-Rptd) No Use of a cane, walker or w/c?: (Patient-Rptd) No Grab bars in the bathroom?: (Patient-Rptd) Yes Shower chair or bench in shower?: (Patient-Rptd) Yes Elevated toilet seat or a handicapped toilet?: (Patient-Rptd) Yes  TIMED UP AND GO:  Was the test performed?  No  Cognitive Function: 6CIT completed        04/28/2024    2:29 PM 04/23/2023    2:29 PM 04/14/2022    2:19 PM  6CIT Screen  What Year? 0 points 0 points 0 points  What month? 0 points 0 points 0 points  What time? 0 points 0 points 0 points  Count back from 20 0 points 0 points 0 points  Months in reverse 0 points 0 points 0 points  Repeat phrase 2 points 0  points 0 points  Total Score 2 points 0 points 0 points    Immunizations Immunization History  Administered Date(s) Administered   Fluad Quad(high Dose 65+) 04/12/2019, 05/21/2020, 04/09/2021   Influenza Whole 04/22/2022   Influenza-Unspecified 05/03/2015, 04/11/2016, 04/08/2018, 04/22/2022    PFIZER(Purple Top)SARS-COV-2 Vaccination 08/11/2019, 09/05/2019, 04/20/2020   PNEUMOCOCCAL CONJUGATE-20 07/03/2021   Tdap 06/10/2016   Unspecified SARS-COV-2 Vaccination 11/29/2020, 04/16/2023    Screening Tests Health Maintenance  Topic Date Due   Mammogram  08/25/2023   Influenza Vaccine  02/04/2024   COVID-19 Vaccine (6 - 2025-26 season) 03/06/2024   Medicare Annual Wellness (AWV)  04/28/2025   DTaP/Tdap/Td (2 - Td or Tdap) 06/10/2026   Colonoscopy  10/31/2026   Pneumococcal Vaccine: 50+ Years  Completed   DEXA SCAN  Completed   Hepatitis C Screening  Completed   Meningococcal B Vaccine  Aged Out   Zoster Vaccines- Shingrix  Discontinued    Health Maintenance Items Addressed: Vaccines Due: flu and covid, Knows due for mammogram  Additional Screening:  Vision Screening: Recommended annual ophthalmology exams for early detection of glaucoma and other disorders of the eye. Is the patient up to date with their annual eye exam?  Yes  Who is the provider or what is the name of the office in which the patient attends annual eye exams? Dr. Debarah  Dental Screening: Recommended annual dental exams for proper oral hygiene  Community Resource Referral / Chronic Care Management: CRR required this visit?  No   CCM required this visit?  No   Plan:    I have personally reviewed and noted the following in the patient's chart:   Medical and social history Use of alcohol, tobacco or illicit drugs  Current medications and supplements including opioid prescriptions. Patient is not currently taking opioid prescriptions. Functional ability and status Nutritional status Physical activity Advanced directives List of other physicians Hospitalizations, surgeries, and ER visits in previous 12 months Vitals Screenings to include cognitive, depression, and falls Referrals and appointments  In addition, I have reviewed and discussed with patient certain preventive protocols, quality  metrics, and best practice recommendations. A written personalized care plan for preventive services as well as general preventive health recommendations were provided to patient.   Ardella FORBES Dawn, LPN   [id number]/[id number]   After Visit Summary: (MyChart) Due to this being a telephonic visit, the after visit summary with patients personalized plan was offered to patient via MyChart   Notes: Nothing significant to report at this time.

## 2024-04-28 NOTE — Telephone Encounter (Signed)
 Copied from CRM [id number]. Topic: Appointments - Transfer of Care >> Apr 28, 2024 12:31 PM Shanda MATSU wrote: Pt is requesting to transfer FROM: Dr. Berneta Pt is requesting to transfer TO: Dr. Aletha Reason for requested transfer: Personal Preference It is the responsibility of the team the patient would like to transfer to (Dr. Aletha) to reach out to the patient if for any reason this transfer is not acceptable.

## 2024-04-28 NOTE — Patient Instructions (Signed)
 Ms. Schissler,  Thank you for taking the time for your Medicare Wellness Visit. I appreciate your continued commitment to your health goals. Please review the care plan we discussed, and feel free to reach out if I can assist you further.  Medicare recommends these wellness visits once per year to help you and your care team stay ahead of potential health issues. These visits are designed to focus on prevention, allowing your provider to concentrate on managing your acute and chronic conditions during your regular appointments.  Please note that Annual Wellness Visits do not include a physical exam. Some assessments may be limited, especially if the visit was conducted virtually. If needed, we may recommend a separate in-person follow-up with your provider.  Ongoing Care Seeing your primary care provider every 3 to 6 months helps us  monitor your health and provide consistent, personalized care.   Referrals If a referral was made during today's visit and you haven't received any updates within two weeks, please contact the referred provider directly to check on the status.  Recommended Screenings:  Health Maintenance  Topic Date Due   Breast Cancer Screening  08/25/2023   Flu Shot  02/04/2024   COVID-19 Vaccine (6 - 2025-26 season) 03/06/2024   Medicare Annual Wellness Visit  04/28/2025   DTaP/Tdap/Td vaccine (2 - Td or Tdap) 06/10/2026   Colon Cancer Screening  10/31/2026   Pneumococcal Vaccine for age over 6  Completed   DEXA scan (bone density measurement)  Completed   Hepatitis C Screening  Completed   Meningitis B Vaccine  Aged Out   Zoster (Shingles) Vaccine  Discontinued       04/28/2024    2:27 PM  Advanced Directives  Does Patient Have a Medical Advance Directive? No  Would patient like information on creating a medical advance directive? No - Patient declined   Advance Care Planning is important because it: Ensures you receive medical care that aligns with your values,  goals, and preferences. Provides guidance to your family and loved ones, reducing the emotional burden of decision-making during critical moments.  Vision: Annual vision screenings are recommended for early detection of glaucoma, cataracts, and diabetic retinopathy. These exams can also reveal signs of chronic conditions such as diabetes and high blood pressure.  Dental: Annual dental screenings help detect early signs of oral cancer, gum disease, and other conditions linked to overall health, including heart disease and diabetes.  Please see the attached documents for additional preventive care recommendations.

## 2024-05-29 ENCOUNTER — Other Ambulatory Visit: Payer: Self-pay | Admitting: Obstetrics & Gynecology

## 2024-05-29 DIAGNOSIS — Z1231 Encounter for screening mammogram for malignant neoplasm of breast: Secondary | ICD-10-CM

## 2024-06-05 ENCOUNTER — Ambulatory Visit
Admission: RE | Admit: 2024-06-05 | Discharge: 2024-06-05 | Disposition: A | Source: Ambulatory Visit | Attending: Obstetrics & Gynecology | Admitting: Obstetrics & Gynecology

## 2024-06-05 DIAGNOSIS — Z1231 Encounter for screening mammogram for malignant neoplasm of breast: Secondary | ICD-10-CM

## 2024-07-25 ENCOUNTER — Encounter: Payer: Self-pay | Admitting: Family Medicine

## 2024-07-25 ENCOUNTER — Ambulatory Visit (INDEPENDENT_AMBULATORY_CARE_PROVIDER_SITE_OTHER): Admitting: Family Medicine

## 2024-07-25 VITALS — BP 146/100 | HR 68 | Ht 65.5 in | Wt 172.1 lb

## 2024-07-25 DIAGNOSIS — G629 Polyneuropathy, unspecified: Secondary | ICD-10-CM | POA: Diagnosis not present

## 2024-07-25 DIAGNOSIS — Z7689 Persons encountering health services in other specified circumstances: Secondary | ICD-10-CM

## 2024-07-25 DIAGNOSIS — C250 Malignant neoplasm of head of pancreas: Secondary | ICD-10-CM

## 2024-07-25 DIAGNOSIS — R634 Abnormal weight loss: Secondary | ICD-10-CM | POA: Diagnosis not present

## 2024-07-25 DIAGNOSIS — I10 Essential (primary) hypertension: Secondary | ICD-10-CM | POA: Diagnosis not present

## 2024-07-25 DIAGNOSIS — E785 Hyperlipidemia, unspecified: Secondary | ICD-10-CM | POA: Diagnosis not present

## 2024-07-25 MED ORDER — IRBESARTAN 300 MG PO TABS: 300.0000 mg | ORAL_TABLET | Freq: Every day | ORAL | 1 refills | Status: AC

## 2024-07-25 MED ORDER — DULOXETINE HCL 20 MG PO CPEP: 20.0000 mg | ORAL_CAPSULE | Freq: Every day | ORAL | 1 refills | Status: AC

## 2024-07-25 NOTE — Progress Notes (Unsigned)
 "  Patient Office Visit  Assessment & Plan:  Encounter to establish care  Adenocarcinoma of head of pancreas (HCC)  Hyperlipidemia, unspecified hyperlipidemia type -     Lipid panel  Essential hypertension -     Irbesartan ; Take 1 tablet (300 mg total) by mouth daily.  Dispense: 90 tablet; Refill: 1  Neuropathy -     DULoxetine  HCl; Take 1 capsule (20 mg total) by mouth daily.  Dispense: 90 capsule; Refill: 1 -     TSH -     VITAMIN D  25 Hydroxy (Vit-D Deficiency, Fractures) -     CBC with Differential/Platelet -     Comprehensive metabolic panel with GFR -     Vitamin B12  Weight loss   Assessment and Plan    Adenocarcinoma of the head of pancreas, post-treatment Increased risk of other cancers discussed. - Continue follow-up with oncology for CT scan in February. - Emphasized importance of regular screenings due to increased cancer risk.  Essential hypertension Hypertension managed with irbesartan . Recent blood pressure elevation noted. Previous medications not tolerated. - Increased irbesartan  to 300 mg daily. - Monitor blood pressure regularly. - Encouraged healthy diet and adequate hydration.  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 Symptoms are bothersome but not debilitating. Cymbalta  preferred due to cost and coverage. - Prescribed Cymbalta  (duloxetine ) at a low dose. - Monitor for side effects and efficacy.  Hyperlipidemia Statins discontinued due to liver enzyme issues. Discussed cholesterol monitoring and potential statin reintroduction. - Consider statin therapy if cholesterol levels elevated.     Test results were reviewed and analyzed as part of the medical decision making of this visit.  Previous notes from [facility] and imaging studies during the office visit.  Follow-up on lab work notify patient.  If liver tests are normal she may be able to restart statin.  Start Cymbalta  20 mg once a day to see if this helps with the  neuropathy may need to increase the dosage in the future.  Increase the Avapro  to 300 mg once a day.  Monitor blood pressures at home.  Recommend healthy diet and gradual exercise as tolerated.   Return in about 2 months (around 09/22/2024), or if symptoms worsen or fail to improve, for physical.   Subjective:    Patient ID: Tonya Bowers, female    DOB: Jul 28, 1950  Age: 74 y.o. MRN: [id number]  Chief Complaint  Patient presents with   Medical Management of Chronic Issues   Establish Care    HPI Discussed the use of AI scribe software for clinical note transcription with the patient, who gave verbal consent to proceed.  History of Present Illness       History of Present Illness Tonya Bowers is a 74 year old female who presents for follow-up of hypertension and neuropathy. she is here to establish primary care in our office(transfer of care)  She has a history of pancreatic cancer diagnosed last year, for which she underwent chemotherapy. Her energy levels are returning to normal, although they were low post-treatment. She experienced weight loss during her treatment, which she acknowledges was needed but not in that manner. Her appetite has returned, and food tastes normal again.  She is currently taking Irbesartan  150 mg for hypertension, which she has been on for a couple of years. Her blood pressure readings were good in early 2024 but elevated in December 2024. She notes that her blood pressure fluctuates and does not check it as often as she  should. She has tried other medications in the past, such as amlodipine , which caused breast tenderness, aldactone  and chlorthalidone /HCTZ, which affected her potassium levels. She is also taking Creon post-surgery and vitamins.  She experiences neuropathy in her hands and feet, describing it as a nuisance with tingling in her fingertips and feet. She wears compression socks to manage the symptoms. She has not started any medication for neuropathy  due to concerns about side effects, particularly with gabapentin. She uses melatonin to aid sleep, which she applies to her feet along with compression socks.  Her family history includes breast cancer on her maternal aunt's side, who passed away a long time ago. Her mother is 29 years old and has never had cancer. She has a large family with half-siblings from her father's side.  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 She does not consume alcohol, drugs, or tobacco. She reports having a flu shot in October and had a mammogram in December. She was told she did not need another colonoscopy due to no polyps being found previously. She had a mammogram in December and was told she did not need another colonoscopy due to no polyps being found previously.  Physical Exam CARDIOVASCULAR: Heart strong.  Results Labs CBC: No anemia CA 19-9: Within normal range Liver function panel: Currently normal, previously elevated  Assessment and Plan Adenocarcinoma of the head of pancreas, post-treatment Increased risk of other cancers discussed. - Continue follow-up with oncology for CT scan in February. - Emphasized importance of regular screenings due to increased cancer risk.  Essential hypertension Hypertension managed with irbesartan . Recent blood pressure elevation noted. Previous medications not tolerated. - Increased irbesartan  to 300 mg daily. - Monitor blood pressure regularly. - Encouraged healthy diet and adequate hydration.  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 Symptoms are bothersome but not debilitating. Cymbalta  preferred due to cost and coverage. - Prescribed Cymbalta  (duloxetine ) at a low dose. - Monitor for side effects and efficacy.  Hyperlipidemia Statins discontinued due to liver enzyme issues. Discussed cholesterol monitoring and potential statin reintroduction. -  Consider statin therapy if cholesterol levels elevated.    The 10-year ASCVD risk score (Arnett DK, et al., 2019) is: 12.4%  Past Medical History:  Diagnosis Date   Arthritis    Cancer (HCC)    pancreas   Cataract    Diverticulosis    GERD (gastroesophageal reflux disease)    Hyperplastic colon polyp 09/09/2006   Past Surgical History:  Procedure Laterality Date   ABDOMINAL HYSTERECTOMY     CHOLECYSTECTOMY     COLONOSCOPY     PANCREATECTOMY, TOTAL DUODENECT/PART GASTRECTOMY/CHOLEDOCHOENTEROST/GASTROJEJUNOST; DARRA  03/02/2024   POLYPECTOMY     TUBAL LIGATION     Social History[1] Family History  Problem Relation Age of Onset   Hypertension Mother    Hypertension Father    Kidney disease Brother        kidney transplant    Hypertension Brother    Breast cancer Maternal Aunt    Colon cancer Neg Hx    Allergies[2]  ROS    Objective:    BP (!) 146/100   Pulse 68   Ht 5' 5.5 (1.664 m)   Wt 172 lb 2 oz (78.1 kg)   LMP 07/07/1995   SpO2 99%   BMI 28.21 kg/m  BP Readings from Last 3 Encounters:  07/25/24 (!) 146/100  06/29/23 (!) 165/66  03/11/23 124/84   Wt  Readings from Last 3 Encounters:  07/25/24 172 lb 2 oz (78.1 kg)  04/28/24 175 lb (79.4 kg)  06/28/23 235 lb (106.6 kg)    Physical Exam Vitals and nursing note reviewed.  Constitutional:      General: She is not in acute distress.    Appearance: Normal appearance.  HENT:     Head: Normocephalic.     Right Ear: Tympanic membrane, ear canal and external ear normal.     Left Ear: Tympanic membrane, ear canal and external ear normal.  Eyes:     Extraocular Movements: Extraocular movements intact.     Conjunctiva/sclera: Conjunctivae normal.     Pupils: Pupils are equal, round, and reactive to light.  Cardiovascular:     Rate and Rhythm: Normal rate and regular rhythm.     Heart sounds: Normal heart sounds.  Pulmonary:     Effort: Pulmonary effort is normal.     Breath sounds:  Normal breath sounds.  Musculoskeletal:     Right lower leg: No edema.     Left lower leg: No edema.  Neurological:     General: No focal deficit present.     Mental Status: She is alert and oriented to person, place, and time.  Psychiatric:        Mood and Affect: Mood normal.        Behavior: Behavior normal.        Thought Content: Thought content normal.        Judgment: Judgment normal.      No results found for any visits on 07/25/24.          [1]  Social History Tobacco Use   Smoking status: Never   Smokeless tobacco: Never  Vaping Use   Vaping status: Never Used  Substance Use Topics   Alcohol use: Not Currently    Comment: maybe occasionally; twice a year   Drug use: No  [2]  Allergies Allergen Reactions   Aldactone  [Spironolactone ] Other (See Comments)    Breast tenderness    Amlodipine  Other (See Comments)    Hot flash    Lisinopril  Cough   Penicillins    Thiazide-Type Diuretics Other (See Comments)    Hypokalemia.   "

## 2024-07-26 ENCOUNTER — Ambulatory Visit: Payer: Self-pay | Admitting: Family Medicine

## 2024-07-26 LAB — CBC WITH DIFFERENTIAL/PLATELET
Absolute Lymphocytes: 1949 {cells}/uL (ref 850–3900)
Absolute Monocytes: 426 {cells}/uL (ref 200–950)
Basophils Absolute: 39 {cells}/uL (ref 0–200)
Basophils Relative: 0.7 %
Eosinophils Absolute: 62 {cells}/uL (ref 15–500)
Eosinophils Relative: 1.1 %
HCT: 36.8 % (ref 35.9–46.0)
Hemoglobin: 11.8 g/dL (ref 11.7–15.5)
MCH: 27.4 pg (ref 27.0–33.0)
MCHC: 32.1 g/dL (ref 31.6–35.4)
MCV: 85.4 fL (ref 81.4–101.7)
Monocytes Relative: 7.6 %
Neutro Abs: 3125 {cells}/uL (ref 1500–7800)
Neutrophils Relative %: 55.8 %
Platelets: 132 Thousand/uL — ABNORMAL LOW (ref 140–400)
RBC: 4.31 Million/uL (ref 3.80–5.10)
RDW: 14.5 % (ref 11.0–15.0)
Total Lymphocyte: 34.8 %
WBC: 5.6 Thousand/uL (ref 3.8–10.8)

## 2024-07-26 LAB — COMPREHENSIVE METABOLIC PANEL WITH GFR
AG Ratio: 1.6 (calc) (ref 1.0–2.5)
ALT: 82 U/L — ABNORMAL HIGH (ref 6–29)
AST: 86 U/L — ABNORMAL HIGH (ref 10–35)
Albumin: 4.4 g/dL (ref 3.6–5.1)
Alkaline phosphatase (APISO): 520 U/L — ABNORMAL HIGH (ref 37–153)
BUN: 8 mg/dL (ref 7–25)
CO2: 29 mmol/L (ref 20–32)
Calcium: 9.4 mg/dL (ref 8.6–10.4)
Chloride: 104 mmol/L (ref 98–110)
Creat: 0.72 mg/dL (ref 0.60–1.00)
Globulin: 2.7 g/dL (ref 1.9–3.7)
Glucose, Bld: 103 mg/dL — ABNORMAL HIGH (ref 65–99)
Potassium: 4 mmol/L (ref 3.5–5.3)
Sodium: 141 mmol/L (ref 135–146)
Total Bilirubin: 1 mg/dL (ref 0.2–1.2)
Total Protein: 7.1 g/dL (ref 6.1–8.1)
eGFR: 89 mL/min/1.73m2

## 2024-07-26 LAB — LIPID PANEL
Cholesterol: 195 mg/dL
HDL: 66 mg/dL
LDL Cholesterol (Calc): 105 mg/dL — ABNORMAL HIGH
Non-HDL Cholesterol (Calc): 129 mg/dL
Total CHOL/HDL Ratio: 3 (calc)
Triglycerides: 143 mg/dL

## 2024-07-26 LAB — TSH: TSH: 1.53 m[IU]/L (ref 0.40–4.50)

## 2024-07-26 LAB — VITAMIN B12: Vitamin B-12: 862 pg/mL (ref 200–1100)

## 2024-07-26 LAB — VITAMIN D 25 HYDROXY (VIT D DEFICIENCY, FRACTURES): Vit D, 25-Hydroxy: 58 ng/mL (ref 30–100)

## 2024-08-25 ENCOUNTER — Encounter: Admitting: Family Medicine

## 2024-09-22 ENCOUNTER — Ambulatory Visit: Admitting: Family Medicine
